# Patient Record
Sex: Female | Born: 1935
Health system: Southern US, Community
[De-identification: ages and names within clinical notes are randomized; demographics above are authoritative.]

## PROBLEM LIST (undated history)

## (undated) DIAGNOSIS — R413 Other amnesia: Secondary | ICD-10-CM

## (undated) DIAGNOSIS — F32A Depression, unspecified: Secondary | ICD-10-CM

## (undated) DIAGNOSIS — C439 Malignant melanoma of skin, unspecified: Secondary | ICD-10-CM

## (undated) DIAGNOSIS — I10 Essential (primary) hypertension: Secondary | ICD-10-CM

## (undated) DIAGNOSIS — R001 Bradycardia, unspecified: Secondary | ICD-10-CM

## (undated) DIAGNOSIS — E782 Mixed hyperlipidemia: Secondary | ICD-10-CM

## (undated) DIAGNOSIS — N200 Calculus of kidney: Secondary | ICD-10-CM

## (undated) DIAGNOSIS — I5022 Chronic systolic (congestive) heart failure: Secondary | ICD-10-CM

## (undated) DIAGNOSIS — I493 Ventricular premature depolarization: Secondary | ICD-10-CM

## (undated) DIAGNOSIS — IMO0002 Reserved for concepts with insufficient information to code with codable children: Secondary | ICD-10-CM

## (undated) DIAGNOSIS — K862 Cyst of pancreas: Secondary | ICD-10-CM

## (undated) DIAGNOSIS — F419 Anxiety disorder, unspecified: Secondary | ICD-10-CM

## (undated) DIAGNOSIS — F329 Major depressive disorder, single episode, unspecified: Secondary | ICD-10-CM

## (undated) DIAGNOSIS — H409 Unspecified glaucoma: Secondary | ICD-10-CM

## (undated) DIAGNOSIS — K5792 Diverticulitis of intestine, part unspecified, without perforation or abscess without bleeding: Secondary | ICD-10-CM

## (undated) DIAGNOSIS — I2510 Atherosclerotic heart disease of native coronary artery without angina pectoris: Secondary | ICD-10-CM

## (undated) DIAGNOSIS — Z950 Presence of cardiac pacemaker: Secondary | ICD-10-CM

## (undated) HISTORY — PX: PACEMAKER INSERTION: SHX728

## (undated) HISTORY — DX: Diverticulitis of intestine, part unspecified, without perforation or abscess without bleeding: K57.92

## (undated) HISTORY — DX: Mixed hyperlipidemia: E78.2

## (undated) HISTORY — PX: CATARACT EXTRACTION, BILATERAL: SHX1313

## (undated) HISTORY — DX: Unspecified glaucoma: H40.9

## (undated) HISTORY — DX: Depression, unspecified: F32.A

## (undated) HISTORY — DX: Major depressive disorder, single episode, unspecified: F32.9

## (undated) HISTORY — PX: EYE SURGERY: SHX253

## (undated) HISTORY — PX: TUMOR EXCISION: SHX421

## (undated) HISTORY — DX: Ventricular premature depolarization: I49.3

---
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
## 1981-10-05 HISTORY — PX: CHOLECYSTECTOMY: SHX55

## 2006-04-17 ENCOUNTER — Emergency Department: Payer: Self-pay | Admitting: Internal Medicine

## 2006-04-17 ENCOUNTER — Other Ambulatory Visit: Payer: Self-pay

## 2006-04-26 ENCOUNTER — Inpatient Hospital Stay: Payer: Self-pay | Admitting: Unknown Physician Specialty

## 2006-04-26 ENCOUNTER — Other Ambulatory Visit: Payer: Self-pay

## 2006-06-08 ENCOUNTER — Ambulatory Visit: Payer: Self-pay | Admitting: Unknown Physician Specialty

## 2006-07-05 ENCOUNTER — Ambulatory Visit: Payer: Self-pay | Admitting: Unknown Physician Specialty

## 2006-08-05 ENCOUNTER — Ambulatory Visit: Payer: Self-pay | Admitting: Unknown Physician Specialty

## 2006-10-05 DIAGNOSIS — C439 Malignant melanoma of skin, unspecified: Secondary | ICD-10-CM

## 2006-10-05 HISTORY — DX: Malignant melanoma of skin, unspecified: C43.9

## 2006-11-05 ENCOUNTER — Ambulatory Visit: Payer: Self-pay | Admitting: Unknown Physician Specialty

## 2006-12-04 ENCOUNTER — Ambulatory Visit: Payer: Self-pay | Admitting: Unknown Physician Specialty

## 2007-01-04 ENCOUNTER — Ambulatory Visit: Payer: Self-pay | Admitting: Unknown Physician Specialty

## 2007-02-03 ENCOUNTER — Ambulatory Visit: Payer: Self-pay | Admitting: Unknown Physician Specialty

## 2007-03-06 ENCOUNTER — Ambulatory Visit: Payer: Self-pay | Admitting: Unknown Physician Specialty

## 2007-04-05 ENCOUNTER — Ambulatory Visit: Payer: Self-pay | Admitting: Unknown Physician Specialty

## 2007-05-06 ENCOUNTER — Ambulatory Visit: Payer: Self-pay | Admitting: Unknown Physician Specialty

## 2007-06-06 ENCOUNTER — Ambulatory Visit: Payer: Self-pay | Admitting: Unknown Physician Specialty

## 2007-06-15 ENCOUNTER — Ambulatory Visit: Payer: Self-pay | Admitting: Cardiology

## 2007-06-20 ENCOUNTER — Ambulatory Visit: Payer: Self-pay | Admitting: Internal Medicine

## 2007-06-21 ENCOUNTER — Ambulatory Visit: Payer: Self-pay

## 2007-06-22 ENCOUNTER — Ambulatory Visit: Payer: Self-pay | Admitting: Cardiology

## 2007-07-06 ENCOUNTER — Ambulatory Visit: Payer: Self-pay | Admitting: Unknown Physician Specialty

## 2007-07-26 ENCOUNTER — Ambulatory Visit: Payer: Self-pay | Admitting: Internal Medicine

## 2007-08-02 ENCOUNTER — Ambulatory Visit: Payer: Self-pay

## 2007-08-11 ENCOUNTER — Ambulatory Visit: Payer: Self-pay

## 2007-08-12 ENCOUNTER — Ambulatory Visit: Payer: Self-pay | Admitting: Unknown Physician Specialty

## 2007-08-18 ENCOUNTER — Ambulatory Visit: Payer: Self-pay

## 2007-09-05 ENCOUNTER — Ambulatory Visit: Payer: Self-pay | Admitting: Unknown Physician Specialty

## 2007-10-06 ENCOUNTER — Ambulatory Visit: Payer: Self-pay | Admitting: Unknown Physician Specialty

## 2007-11-06 ENCOUNTER — Ambulatory Visit: Payer: Self-pay | Admitting: Unknown Physician Specialty

## 2007-12-04 ENCOUNTER — Ambulatory Visit: Payer: Self-pay | Admitting: Unknown Physician Specialty

## 2008-01-04 ENCOUNTER — Ambulatory Visit: Payer: Self-pay | Admitting: Unknown Physician Specialty

## 2008-01-17 ENCOUNTER — Ambulatory Visit: Payer: Self-pay | Admitting: Internal Medicine

## 2008-01-18 ENCOUNTER — Ambulatory Visit: Payer: Self-pay | Admitting: Cardiology

## 2008-01-18 ENCOUNTER — Ambulatory Visit: Payer: Self-pay | Admitting: Internal Medicine

## 2008-02-03 ENCOUNTER — Ambulatory Visit: Payer: Self-pay | Admitting: Unknown Physician Specialty

## 2008-03-05 ENCOUNTER — Ambulatory Visit: Payer: Self-pay | Admitting: Unknown Physician Specialty

## 2008-04-04 ENCOUNTER — Ambulatory Visit: Payer: Self-pay | Admitting: Unknown Physician Specialty

## 2008-05-07 ENCOUNTER — Ambulatory Visit: Payer: Self-pay | Admitting: Unknown Physician Specialty

## 2008-06-05 ENCOUNTER — Ambulatory Visit: Payer: Self-pay | Admitting: Unknown Physician Specialty

## 2008-06-18 ENCOUNTER — Ambulatory Visit: Payer: Self-pay | Admitting: Cardiology

## 2008-07-05 ENCOUNTER — Ambulatory Visit: Payer: Self-pay | Admitting: Unknown Physician Specialty

## 2008-07-16 ENCOUNTER — Ambulatory Visit: Payer: Self-pay | Admitting: Internal Medicine

## 2008-09-04 ENCOUNTER — Ambulatory Visit: Payer: Self-pay | Admitting: Unknown Physician Specialty

## 2008-10-05 ENCOUNTER — Ambulatory Visit: Payer: Self-pay | Admitting: Unknown Physician Specialty

## 2008-10-24 ENCOUNTER — Encounter (INDEPENDENT_AMBULATORY_CARE_PROVIDER_SITE_OTHER): Payer: Self-pay | Admitting: *Deleted

## 2008-11-05 ENCOUNTER — Ambulatory Visit: Payer: Self-pay | Admitting: Unknown Physician Specialty

## 2008-12-03 ENCOUNTER — Ambulatory Visit: Payer: Self-pay | Admitting: Unknown Physician Specialty

## 2009-01-03 ENCOUNTER — Ambulatory Visit: Payer: Self-pay | Admitting: Unknown Physician Specialty

## 2009-02-15 ENCOUNTER — Encounter (INDEPENDENT_AMBULATORY_CARE_PROVIDER_SITE_OTHER): Payer: Self-pay | Admitting: *Deleted

## 2009-02-22 DIAGNOSIS — F329 Major depressive disorder, single episode, unspecified: Secondary | ICD-10-CM | POA: Insufficient documentation

## 2009-02-22 DIAGNOSIS — E785 Hyperlipidemia, unspecified: Secondary | ICD-10-CM

## 2009-02-22 DIAGNOSIS — I498 Other specified cardiac arrhythmias: Secondary | ICD-10-CM

## 2009-02-22 DIAGNOSIS — I493 Ventricular premature depolarization: Secondary | ICD-10-CM

## 2009-02-22 DIAGNOSIS — I10 Essential (primary) hypertension: Secondary | ICD-10-CM

## 2009-02-27 ENCOUNTER — Encounter: Payer: Self-pay | Admitting: Internal Medicine

## 2009-02-27 ENCOUNTER — Ambulatory Visit: Payer: Self-pay | Admitting: Internal Medicine

## 2009-03-05 ENCOUNTER — Ambulatory Visit: Payer: Self-pay | Admitting: Unknown Physician Specialty

## 2009-03-20 ENCOUNTER — Telehealth: Payer: Self-pay | Admitting: Internal Medicine

## 2009-03-22 ENCOUNTER — Ambulatory Visit: Payer: Self-pay | Admitting: Internal Medicine

## 2009-03-22 ENCOUNTER — Encounter: Payer: Self-pay | Admitting: Internal Medicine

## 2009-03-22 DIAGNOSIS — R0789 Other chest pain: Secondary | ICD-10-CM

## 2009-04-02 ENCOUNTER — Ambulatory Visit: Payer: Self-pay | Admitting: Cardiology

## 2009-04-02 ENCOUNTER — Encounter: Payer: Self-pay | Admitting: Cardiology

## 2009-04-16 ENCOUNTER — Ambulatory Visit: Payer: Self-pay | Admitting: Cardiology

## 2009-04-16 ENCOUNTER — Encounter: Payer: Self-pay | Admitting: Cardiovascular Disease

## 2009-04-16 ENCOUNTER — Ambulatory Visit: Payer: Self-pay | Admitting: Internal Medicine

## 2009-04-16 ENCOUNTER — Encounter: Payer: Self-pay | Admitting: Internal Medicine

## 2009-04-22 ENCOUNTER — Telehealth: Payer: Self-pay | Admitting: Cardiology

## 2009-04-26 ENCOUNTER — Ambulatory Visit: Payer: Self-pay | Admitting: Unknown Physician Specialty

## 2009-05-05 ENCOUNTER — Ambulatory Visit: Payer: Self-pay | Admitting: Unknown Physician Specialty

## 2009-06-05 ENCOUNTER — Ambulatory Visit: Payer: Self-pay | Admitting: Unknown Physician Specialty

## 2009-07-05 ENCOUNTER — Ambulatory Visit: Payer: Self-pay | Admitting: Unknown Physician Specialty

## 2009-08-05 ENCOUNTER — Ambulatory Visit: Payer: Self-pay | Admitting: Unknown Physician Specialty

## 2009-09-04 ENCOUNTER — Ambulatory Visit: Payer: Self-pay | Admitting: Unknown Physician Specialty

## 2009-10-07 ENCOUNTER — Ambulatory Visit: Payer: Self-pay | Admitting: Unknown Physician Specialty

## 2009-11-04 ENCOUNTER — Ambulatory Visit: Payer: Self-pay | Admitting: Internal Medicine

## 2009-11-05 ENCOUNTER — Ambulatory Visit: Payer: Self-pay | Admitting: Unknown Physician Specialty

## 2009-12-16 DIAGNOSIS — N23 Unspecified renal colic: Secondary | ICD-10-CM | POA: Insufficient documentation

## 2009-12-16 DIAGNOSIS — N200 Calculus of kidney: Secondary | ICD-10-CM | POA: Insufficient documentation

## 2009-12-20 ENCOUNTER — Ambulatory Visit: Payer: Self-pay | Admitting: Unknown Physician Specialty

## 2009-12-26 ENCOUNTER — Ambulatory Visit: Payer: Self-pay | Admitting: Urology

## 2010-01-06 ENCOUNTER — Ambulatory Visit: Payer: Self-pay | Admitting: General Practice

## 2010-01-08 ENCOUNTER — Ambulatory Visit: Payer: Self-pay | Admitting: Urology

## 2010-01-09 ENCOUNTER — Ambulatory Visit: Payer: Self-pay | Admitting: Urology

## 2010-01-13 ENCOUNTER — Ambulatory Visit: Payer: Self-pay | Admitting: Internal Medicine

## 2010-01-15 ENCOUNTER — Ambulatory Visit: Payer: Self-pay | Admitting: Unknown Physician Specialty

## 2010-01-21 ENCOUNTER — Ambulatory Visit: Payer: Self-pay | Admitting: General Practice

## 2010-01-27 ENCOUNTER — Ambulatory Visit: Payer: Self-pay | Admitting: Internal Medicine

## 2010-01-27 ENCOUNTER — Encounter: Payer: Self-pay | Admitting: Cardiovascular Disease

## 2010-02-03 ENCOUNTER — Telehealth: Payer: Self-pay | Admitting: Internal Medicine

## 2010-02-03 ENCOUNTER — Ambulatory Visit: Payer: Self-pay | Admitting: General Practice

## 2010-02-04 DIAGNOSIS — Z950 Presence of cardiac pacemaker: Secondary | ICD-10-CM

## 2010-02-11 ENCOUNTER — Telehealth: Payer: Self-pay | Admitting: Internal Medicine

## 2010-02-18 ENCOUNTER — Ambulatory Visit: Payer: Self-pay | Admitting: Internal Medicine

## 2010-02-18 ENCOUNTER — Encounter: Payer: Self-pay | Admitting: Internal Medicine

## 2010-02-24 ENCOUNTER — Encounter: Payer: Self-pay | Admitting: Internal Medicine

## 2010-02-24 ENCOUNTER — Ambulatory Visit: Payer: Self-pay | Admitting: Internal Medicine

## 2010-02-26 ENCOUNTER — Telehealth: Payer: Self-pay | Admitting: Internal Medicine

## 2010-02-27 ENCOUNTER — Ambulatory Visit: Payer: Self-pay | Admitting: Internal Medicine

## 2010-02-28 ENCOUNTER — Ambulatory Visit: Payer: Self-pay | Admitting: Unknown Physician Specialty

## 2010-03-05 ENCOUNTER — Ambulatory Visit: Payer: Self-pay | Admitting: Unknown Physician Specialty

## 2010-03-11 ENCOUNTER — Ambulatory Visit: Payer: Self-pay | Admitting: Internal Medicine

## 2010-04-04 ENCOUNTER — Ambulatory Visit: Payer: Self-pay | Admitting: Unknown Physician Specialty

## 2010-04-09 ENCOUNTER — Encounter: Payer: Self-pay | Admitting: Internal Medicine

## 2010-04-14 ENCOUNTER — Ambulatory Visit: Payer: Self-pay | Admitting: Internal Medicine

## 2010-05-07 ENCOUNTER — Ambulatory Visit: Payer: Self-pay | Admitting: General Practice

## 2010-05-14 ENCOUNTER — Ambulatory Visit: Payer: Self-pay | Admitting: Urology

## 2010-05-26 ENCOUNTER — Ambulatory Visit: Payer: Self-pay | Admitting: General Practice

## 2010-05-28 ENCOUNTER — Ambulatory Visit: Payer: Self-pay | Admitting: Unknown Physician Specialty

## 2010-06-05 ENCOUNTER — Ambulatory Visit: Payer: Self-pay | Admitting: Unknown Physician Specialty

## 2010-06-13 ENCOUNTER — Ambulatory Visit: Payer: Self-pay | Admitting: Cardiovascular Disease

## 2010-06-17 ENCOUNTER — Telehealth: Payer: Self-pay | Admitting: Cardiovascular Disease

## 2010-07-07 ENCOUNTER — Ambulatory Visit: Payer: Self-pay | Admitting: Unknown Physician Specialty

## 2010-07-17 ENCOUNTER — Ambulatory Visit: Payer: Self-pay | Admitting: Internal Medicine

## 2010-07-21 ENCOUNTER — Telehealth: Payer: Self-pay | Admitting: Internal Medicine

## 2010-08-01 ENCOUNTER — Encounter (INDEPENDENT_AMBULATORY_CARE_PROVIDER_SITE_OTHER): Payer: Self-pay | Admitting: *Deleted

## 2010-08-18 ENCOUNTER — Ambulatory Visit: Payer: Self-pay | Admitting: Urology

## 2010-08-22 ENCOUNTER — Ambulatory Visit: Payer: Self-pay | Admitting: Unknown Physician Specialty

## 2010-09-04 ENCOUNTER — Ambulatory Visit: Payer: Self-pay | Admitting: Unknown Physician Specialty

## 2010-10-06 ENCOUNTER — Ambulatory Visit: Payer: No Typology Code available for payment source | Admitting: Unknown Physician Specialty

## 2010-10-20 ENCOUNTER — Encounter: Payer: Self-pay | Admitting: Internal Medicine

## 2010-11-03 ENCOUNTER — Encounter: Payer: Self-pay | Admitting: Internal Medicine

## 2010-11-03 ENCOUNTER — Ambulatory Visit
Admission: RE | Admit: 2010-11-03 | Discharge: 2010-11-03 | Payer: Self-pay | Source: Home / Self Care | Attending: Cardiovascular Disease | Admitting: Cardiovascular Disease

## 2010-11-03 DIAGNOSIS — I2510 Atherosclerotic heart disease of native coronary artery without angina pectoris: Secondary | ICD-10-CM | POA: Insufficient documentation

## 2010-11-04 NOTE — Progress Notes (Signed)
Summary: PACEMAKER  Phone Note Call from Patient Call back at Home Phone [phone]   Caller: SELF Call For: KLEIN Summary of Call: HAS A PAINFUL LUMP ON THE LEFT SIDE OF THE INCISION WHERE THE PACEMAKER WAS IMPLANTED-IS THIS NORMAL?   Initial call taken by: Harlon Flor,  Feb 26, 2010 9:17 AM  Follow-up for Phone Call        Attempted TCB pt.  LMOM TCB. Cloyde Reams RN  Feb 26, 2010 5:35 PM   Spoke with pt, Gunnar Fusi and Dr Graciela Husbands are in office this am.  Gunnar Fusi will check pacer site.  Pt will come on in.   Follow-up by: Cloyde Reams RN,  Feb 27, 2010 10:33 AM

## 2010-11-04 NOTE — Procedures (Signed)
Summary: PC2/AMD   Current Medications (verified): 1)  Benazepril-Hydrochlorothiazide 20-25 Mg Tabs (Benazepril-Hydrochlorothiazide) .[name].. 1 By Mouth Once Daily 2)  Potassium Chloride Crys Cr 20 Meq Cr-Tabs (Potassium Chloride Crys Cr) .... Take One Tablet By Mouth Daily 3)  Multivitamins   Tabs (Multiple Vitamin) .[name].. 1 By Mouth Once Daily 4)  Vitamin B Complex-C   Caps (B Complex-C) .[name].. 1 By Mouth Once Daily 5)  Cholesterol Management  Tabs (Nutritional Supplements) .[name].. 1 By Mouth Once Daily 6)  Co Q-10 150 Mg Caps (Coenzyme Q10) .[name].. 1 By Mouth Once Daily 7)  Glucosamine-Chondroitin  Caps (Glucosamine-Chondroit-Vit C-Mn) .... Take Once Daily 8)  Krill Oil 1000 Mg Caps (Krill Oil) .... Take 1 By Mouth Once Daily 9)  Lecithin 1200 Mg Caps (Lecithin) .[name].. 1 By Mouth Once Daily 10)  Silymarin  Caps (Milk Thistle-Turmeric) .[name].. 1 By Mouth Once Daily 11)  Vitamin D 1000 Unit Tabs (Cholecalciferol) .... 2 By Mouth Daily 12)  Fish Oil 1000 Mg Caps (Omega-3 Fatty Acids) .[name].. 1 By Mouth Once Daily 13)  Zyprexa 5 Mg Tabs (Olanzapine) .... One By Mouth At Night  Allergies (verified): 1)  ! Codeine 2)  ! * Simvastatin   PPM Specifications Following MD:  Sherryl Manges, MD     PPM Vendor:  Medtronic     PPM Model Number:  [id number]     PPM Serial Number:  [id number] PPM DOI:  08/13/2005      Lead 1    Location: RA     DOI: 08/13/2005     Model #: 4076     Serial #: [id number]     Status: active Lead 2    Location: RV     DOI: 08/13/2005     Model #: 7829     Serial #: [id number]     Status: active  Magnet Response Rate:  BOL 85 ERI 65    PPM Follow Up Remote Check?  No Battery Voltage:  2.96 V     Pacer Dependent:  No       PPM Device Measurements Atrium  Amplitude: 2.7 mV, Impedance: 316 ohms, Threshold: 1.0 V at 0.5 msec Right Ventricle  Amplitude: 2.0 mV, Impedance: 368 ohms, Threshold: 1.0 V at 0.4 msec  Episodes MS Episodes:  8     Percent Mode Switch:  3 minutes     Coumadin:   No Ventricular High Rate:  3     Atrial Pacing:  52.1%     Ventricular Pacing:  5.9%  Parameters Mode:  DDDR+     Lower Rate Limit:  60     Upper Rate Limit:  130 Paced AV Delay:  240     Sensed AV Delay:  180 Next Cardiology Appt Due:  04/04/2010 Tech Comments:  Atrial sensitivity reprogrammed 0.9, some FFRW noted. Sinus rhythm today with frequent PVC's.  ROV 6 months [name], [place]. Altha Harm, LPN  November 04, 2009 10:18 AM

## 2010-11-04 NOTE — Miscellaneous (Signed)
Summary: dx code correction  Clinical Lists Changes  Problems: Changed problem from PACEMAKER (ICD-V45.[name]01) to PACEMAKER, PERMANENT (ICD-V45.01)  changed the incorrect dx code to correct dx code Genella Mech  August 01, 2010 10:29 AM

## 2010-11-04 NOTE — Miscellaneous (Signed)
Summary: Device change out  Clinical Lists Changes  Observations: Added new observation of PPM DOI: 02/24/2010 (04/09/2010 7:23) Added new observation of PPM SERL#: [id number] (04/09/2010 7:23) Added new observation of PPM MODL#: [id number] (04/09/2010 7:23) Added new observation of PPMEXPLCOMM: 02/24/2010 Medtronic Enrhythm P1501DR/[id number] h explanted. (04/09/2010 7:23)      PPM Specifications Following MD:  Sherryl Manges, MD     PPM Vendor:  Medtronic     PPM Model Number:  ADDR01     PPM Serial Number:  [id number] PPM DOI:  02/24/2010      Lead 1    Location: RA     DOI: 08/13/2005     Model #: 4076     Serial #: [id number]     Status: active Lead 2    Location: RV     DOI: 08/13/2005     Model #: 4076     Serial #: [id number]     Status: active  Magnet Response Rate:  BOL 85 ERI 65  Indications:  Bradycardia  Explantation Comments:  02/24/2010 Medtronic Enrhythm P1501DR/[id number] h explanted.  PPM Follow Up Pacer Dependent:  No      Episodes Coumadin:  No  Parameters Mode:  DDD+     Lower Rate Limit:  60     Upper Rate Limit:  130 Paced AV Delay:  180     Sensed AV Delay:  150

## 2010-11-04 NOTE — Procedures (Signed)
Summary: Cardiology Device Clinic   Current Medications (verified): 1)  Benazepril-Hydrochlorothiazide 20-25 Mg Tabs (Benazepril-Hydrochlorothiazide) .[name].. 1 By Mouth Once Daily 2)  Potassium Chloride Crys Cr 20 Meq Cr-Tabs (Potassium Chloride Crys Cr) .... Take One Tablet By Mouth Daily 3)  Multivitamins   Tabs (Multiple Vitamin) .[name].. 1 By Mouth Once Daily 4)  Vitamin B Complex-C   Caps (B Complex-C) .[name].. 1 By Mouth Once Daily 5)  Cholesterol Management  Tabs (Nutritional Supplements) .[name].. 1 By Mouth Once Daily 6)  Co Q-10 150 Mg Caps (Coenzyme Q10) .[name].. 1 By Mouth Once Daily 7)  Glucosamine-Chondroitin  Caps (Glucosamine-Chondroit-Vit C-Mn) .... Take Once Daily 8)  Vitamin D 1000 Unit Tabs (Cholecalciferol) .... 2 By Mouth Daily 9)  Fish Oil 1000 Mg Caps (Omega-3 Fatty Acids) .[name].. 1 By Mouth Once Daily 10)  Prozac 20 Mg Caps (Fluoxetine Hcl) .... One By Mouth Daily  Allergies (verified): 1)  ! Codeine 2)  ! * Simvastatin  PPM Specifications Following MD:  Sherryl Manges, MD     PPM Vendor:  Medtronic     PPM Model Number:  [id number]     PPM Serial Number:  [id number] PPM DOI:  08/13/2005      Lead 1    Location: RA     DOI: 08/13/2005     Model #: 4076     Serial #: [id number]     Status: active Lead 2    Location: RV     DOI: 08/13/2005     Model #: 7829     Serial #: [id number]     Status: active  Magnet Response Rate:  BOL 85 ERI 65  Indications:  Bradycardia   PPM Follow Up Remote Check?  No Battery Voltage:  2.81 V     Battery Est. Longevity:  ERI     Pacer Dependent:  No       PPM Device Measurements Atrium  Amplitude: 3.0 mV, Impedance: 324 ohms, Threshold: 1.0 V at 0.5 msec Right Ventricle  Amplitude: 2.7 mV, Impedance: 360 ohms, Threshold: 1.0 V at 0.4 msec  Episodes MS Episodes:  0     Percent Mode Switch:  0     Coumadin:  No Ventricular High Rate:  0      Parameters Mode:  DDDR+     Lower Rate Limit:  60     Upper Rate Limit:  130 Paced AV Delay:  240     Sensed AV  Delay:  180 Tech Comments:  Battery @ ERI 11/08/09.  Device function normal.  R-waves 2.31mV chronic.  ROV 2 weeks with [name] in [place]. Altha Harm, LPN  January 13, 2010 9:34 AM

## 2010-11-04 NOTE — Progress Notes (Signed)
Summary: FEET  Phone Note Call from Patient Call back at Home Phone [phone]   Caller: SELF Call For: [facility] Summary of Call: WAS TOLD THAT THERE WAS NOT A CIRCULATION PROBLEM WITH HER FEET-WOULD LIKE TO KNOW IF IT IS RELATED TO HER NERVES AND IF SO IS THERE A PHYSICIAN THAT COULD BE RECOMMENDED TO HER REGARDING THAT? Initial call taken by: Harlon Flor,  June 17, 2010 10:46 AM  Follow-up for Phone Call        referred pt to her PCP. Benedict Needy, RN  June 17, 2010 5:06 PM

## 2010-11-04 NOTE — Procedures (Signed)
Summary: ROV/GLC   Current Medications (verified): 1)  Benazepril-Hydrochlorothiazide 20-25 Mg Tabs (Benazepril-Hydrochlorothiazide) .[name].. 1 By Mouth Once Daily 2)  Potassium Chloride Crys Cr 20 Meq Cr-Tabs (Potassium Chloride Crys Cr) .... Take One Tablet By Mouth Daily 3)  Multivitamins   Tabs (Multiple Vitamin) .[name].. 1 By Mouth Once Daily 4)  Vitamin B Complex-C   Caps (B Complex-C) .[name].. 1 By Mouth Once Daily 5)  Cholesterol Management  Tabs (Nutritional Supplements) .[name].. 1 By Mouth Once Daily 6)  Co Q-10 150 Mg Caps (Coenzyme Q10) .[name].. 1 By Mouth Once Daily 7)  Glucosamine-Chondroitin  Caps (Glucosamine-Chondroit-Vit C-Mn) .... Take Once Daily 8)  Vitamin D 1000 Unit Tabs (Cholecalciferol) .... 2 By Mouth Daily 9)  Fish Oil 1000 Mg Caps (Omega-3 Fatty Acids) .[name].. 1 By Mouth Once Daily 10)  Prozac 20 Mg Caps (Fluoxetine Hcl) .... One By Mouth Daily 11)  Cod Liver Oil  Caps (Cod Liver Oil) .... 3 By Mouth Daily  Allergies (verified): 1)  ! Codeine 2)  ! * Simvastatin   PPM Specifications Following MD:  Sherryl Manges, MD     PPM Vendor:  Medtronic     PPM Model Number:  [id number]     PPM Serial Number:  [id number] PPM DOI:  08/13/2005      Lead 1    Location: RA     DOI: 08/13/2005     Model #: 4076     Serial #: [id number]     Status: active Lead 2    Location: RV     DOI: 08/13/2005     Model #: 8657     Serial #: [id number]     Status: active  Magnet Response Rate:  BOL 85 ERI 65  Indications:  Bradycardia   PPM Follow Up Remote Check?  No Battery Voltage:  2.79 V     Battery Est. Longevity:  10.5 years     Pacer Dependent:  No       PPM Device Measurements Atrium  Amplitude: 2.8 mV, Impedance: 339 ohms, Threshold: 0.875 V at 0.4 msec Right Ventricle  Amplitude: 2.8 mV, Impedance: 422 ohms, Threshold: 0.75 V at 0.4 msec  Episodes MS Episodes:  0     Percent Mode Switch:  0     Coumadin:  No Ventricular High Rate:  0     Atrial Pacing:  47.9%     Ventricular Pacing:   37.7%  Parameters Mode:  DDD+     Lower Rate Limit:  60     Upper Rate Limit:  130 Paced AV Delay:  180     Sensed AV Delay:  150 Next Cardiology Appt Due:  06/05/2010 Tech Comments:  Steri strips removed by the patient.  No redness or edema noted. R-waves are chronic @ 2.8-4.27mV.  There were a total of 399,383 single PVC's from 5/23-6/7.  ROV with [name] in the [place] office in 3 months. Altha Harm, LPN  March 11, 8412 8:56 AM

## 2010-11-04 NOTE — Assessment & Plan Note (Signed)
Summary: F/U 3 MONTHS   Visit Type:  Follow-up Primary Barbara Marks:  Barbara Marks  CC:  c/o feeling fatigue and pacemaker doesn't feel right..  History of Present Illness: Barbara Marks returns today for pacemaker followup with recent genrerator replacement, following premature  ER I. She has had some complaints about the lay of the new pulse generator. There is some hyper sensitivity over the pulse generator. It is difficult to sleep on the left side.  S  She has a history of PVCs, bradycardia associated with her PVCs.   Her major complaint remains fatigue. She thinks is related to depression. She also continues to have chest pain. she underwent thallium scanning which was negative last June which was negative. her pain continues to come for hours at a time. It is unassociated with radiation package taste or exertion   She also has a history of profound f depression and anxiety requiring ECT treatment dating back to the 35s, hi        Current Medications (verified): 1)  Potassium Chloride Crys Cr 20 Meq Cr-Tabs (Potassium Chloride Crys Cr) .... Take One Tablet By Mouth Daily 2)  Multivitamins   Tabs (Multiple Vitamin) .[name].. 1 By Mouth Once Daily 3)  Vitamin B Complex-C   Caps (B Complex-C) .[name].. 1 By Mouth Once Daily 4)  Cholesterol Management  Tabs (Nutritional Supplements) .[name].. 1 By Mouth Once Daily 5)  Co Q-10 150 Mg Caps (Coenzyme Q10) .[name].. 1 By Mouth Once Daily 6)  Glucosamine-Chondroitin  Caps (Glucosamine-Chondroit-Vit C-Mn) .... Take Once Daily 7)  Vitamin D 1000 Unit Tabs (Cholecalciferol) .... 2 By Mouth Daily 8)  Cod Liver Oil  Caps (Cod Liver Oil) .... 3 By Mouth Daily 9)  Benazepril Hcl 20 Mg Tabs (Benazepril Hcl) .... One Tablet Once Daily 10)  Celexa 10 Mg Tabs (Citalopram Hydrobromide) .... One Tablet Once Daily  Allergies (verified): 1)  ! Codeine 2)  ! * Simvastatin  Past History:  Past Medical History: Last updated: 02/22/2009 DEPRESSION (ICD-311) PREMATURE  VENTRICULAR CONTRACTIONS (ICD-427.69) HYPERLIPIDEMIA-MIXED (ICD-272.4) BRADYCARDIA (ICD-427.89) HYPERTENSION, UNSPECIFIED (ICD-401.9) PACEMAKER (ICD-V45.[name]01)    Past Surgical History: Last updated: 02/22/2009 Appendectomy - '52 tumors & nemamgeomas  gall bladder - '83 pacemaker - medtronic  Social History: Last updated: 02/22/2009 Retired  Married  Tobacco Use - No.  Alcohol Use - yes Drug Use - no  Risk Factors: Smoking Status: never (02/22/2009)  Vital Signs:  Patient profile:   77 year old female Height:      66 inches Weight:      152 pounds BP sitting:   144 / 70  (left arm) Cuff size:   regular  Vitals Entered By: Bishop Dublin, CMA (July 17, 2010 9:32 AM)  Physical Exam  General:  Well developed, well nourished, elderly woman in no apparent distress Chest Wall:  pacemaker pocket is well-healed without overlying erythema Lungs:  Clear bilaterally to auscultation and percussion. Heart:  Non-displaced PMI, chest non-tender; regular rate and rhythm, S1, S2 without murmurs, rubs or gallops. Carotid upstroke normal, no bruit.  Pedals normal pulses. No edema, no varicosities. Abdomen:   abdomen soft and non-tender active bowels Msk:  Back normal, normal gait. Muscle strength and tone normal. Extremities:  no clubbing cyanosis or edema Neurologic:  grossly normal motor and sensory function Skin:  and dry without rashes Psych:  flat affect   PPM Specifications Following MD:  Sherryl Manges, MD     PPM Vendor:  Medtronic     PPM Model Number:  ADDR01     PPM Serial Number:  [id number] PPM DOI:  02/24/2010      Lead 1    Location: RA     DOI: 08/13/2005     Model #: 4076     Serial #: [id number]     Status: active Lead 2    Location: RV     DOI: 08/13/2005     Model #: 4076     Serial #: [id number]     Status: active  Magnet Response Rate:  BOL 85 ERI 65  Indications:  Bradycardia  Explantation Comments:  02/24/2010 Medtronic Enrhythm P1501DR/[id number] h  explanted.  PPM Follow Up Remote Check?  No Battery Voltage:  2.80 V     Battery Est. Longevity:  11 years     Pacer Dependent:  No       PPM Device Measurements Atrium  Amplitude: 2.8 mV, Impedance: 350 ohms, Threshold: 0.875 V at 0.4 msec Right Ventricle  Amplitude: 2.8 mV, Impedance: 494 ohms, Threshold: 0.625 V at 0.4 msec  Episodes MS Episodes:  4     Percent Mode Switch:  <0.1%     Coumadin:  No Ventricular High Rate:  0     Atrial Pacing:  41.9%     Ventricular Pacing:  19.8%  Parameters Mode:  DDI     Lower Rate Limit:  40     Upper Rate Limit:  130 Paced AV Delay:  300     Sensed AV Delay:  150 Rate Response Parameters:  PVARP Next Cardiology Appt Due:  02/03/2011 Tech Comments:  Checked by [employer].Device reprogrammed as above. ROV 5/12 with [name] in [facility] [place], LPN  July 17, 2010 10:03 AM   Impression & Recommendations:  Problem # 1:  CARDIAC PACEMAKER IN SITU (ICD-V45.01) Device parameters and data were reviewed ; there is increased ventricular and atrial pacing. I have reprogrammed the device to the DDI mode at 40 beats per minute. This will help [place] understand how much he ever needs it and whether it is potentailly removable down the road  Problem # 2:  DEPRESSION (ICD-311) ongoing and i suspect the cause fo the depression.  I dont think it is likely that the fatigue is 2/2 pvc which have been there forever  Problem # 3:  PREMATURE VENTRICULAR CONTRACTIONS (ICD-427.69) as above Her updated medication list for this problem includes:    Benazepril Hcl 20 Mg Tabs (Benazepril hcl) ..... One tablet once daily  Problem # 4:  CHEST PAIN, NON-CARDIAC (ICD-786.59)  stable  Her updated medication list for this problem includes:    Benazepril Hcl 20 Mg Tabs (Benazepril hcl) ..... One tablet once daily

## 2010-11-04 NOTE — Op Note (Signed)
Summary: Explanation of previously implanted device & implantationof a ne  Explanation of previously implanted device & implantationof a new device   Imported By: Harlon Flor 02/25/2010 10:55:51  _____________________________________________________________________  External Attachment:    Type:   Image     Comment:   External Document

## 2010-11-04 NOTE — Cardiovascular Report (Signed)
Summary: Office Visit   Office Visit   Imported By: Roderic Ovens 01/22/2010 16:29:16  _____________________________________________________________________  External Attachment:    Type:   Image     Comment:   External Document

## 2010-11-04 NOTE — Cardiovascular Report (Signed)
Summary: Office Visit   Office Visit   Imported By: Roderic Ovens 11/06/2009 13:45:13  _____________________________________________________________________  External Attachment:    Type:   Image     Comment:   External Document

## 2010-11-04 NOTE — Assessment & Plan Note (Signed)
Summary: F2W/AMD   Primary Provider:  [name]  CC:  Chest pain;Device Check.  History of Present Illness:  .  [name] returns today as her pacemaker has reached inappropriately early ER I. She has the Medtronic and rhythm device which has has known characteristic. she comes in today to discuss generator replacement  She also continues to have chest pain. she underwent thallium scanning which was negative last June which was negative. her pain continues to come for hours at a time. It is unassociated with radiation package taste or exertion  She has been undergoing a great deal of stress recently. Her husband has been diagnosed with pancreatic cancer. She herself is resistant about ECT (again)  She also complains of cold intolerance.  Problems Prior to Update: 1)  Chest Tightness-pressure-other  ([id number]) 2)  Depression  (ICD-311) 3)  Premature Ventricular Contractions  (ICD-427.69) 4)  Hyperlipidemia-mixed  (ICD-272.4) 5)  Bradycardia  (ICD-427.89) 6)  Hypertension, Unspecified  (ICD-401.9) 7)  Pacemaker  (ICD-V45.[name]01)  Current Medications (verified): 1)  Benazepril-Hydrochlorothiazide 20-25 Mg Tabs (Benazepril-Hydrochlorothiazide) .[name].. 1 By Mouth Once Daily 2)  Potassium Chloride Crys Cr 20 Meq Cr-Tabs (Potassium Chloride Crys Cr) .... Take One Tablet By Mouth Daily 3)  Multivitamins   Tabs (Multiple Vitamin) .[name].. 1 By Mouth Once Daily 4)  Vitamin B Complex-C   Caps (B Complex-C) .[name].. 1 By Mouth Once Daily 5)  Cholesterol Management  Tabs (Nutritional Supplements) .[name].. 1 By Mouth Once Daily 6)  Co Q-10 150 Mg Caps (Coenzyme Q10) .[name].. 1 By Mouth Once Daily 7)  Glucosamine-Chondroitin  Caps (Glucosamine-Chondroit-Vit C-Mn) .... Take Once Daily 8)  Vitamin D 1000 Unit Tabs (Cholecalciferol) .... 2 By Mouth Daily 9)  Fish Oil 1000 Mg Caps (Omega-3 Fatty Acids) .[name].. 1 By Mouth Once Daily 10)  Prozac 20 Mg Caps (Fluoxetine Hcl) .... One By Mouth Daily  Allergies: 1)  !  Codeine 2)  ! * Simvastatin  Vital Signs:  Patient profile:   77 year old female Height:      66 inches Weight:      155 pounds BMI:     25.11 Pulse rate:   80 / minute BP sitting:   140 / 70  (left arm)  Vitals Entered By: Stanton Kidney, EMT-P (January 27, 2010 11:09 AM)  Physical Exam  General:  The patient was alert and oriented in no acute distress. HEENT Normal.  Neck veins were flat, carotids were brisk.  Lungs were clear.  Heart sounds were regular without murmurs or gallops.  Abdomen was soft with active bowel sounds. There is no clubbing cyanosis or edema. Skin Warm and dry    PPM Specifications Following MD:  Sherryl Manges, MD     PPM Vendor:  Medtronic     PPM Model Number:  [id number]     PPM Serial Number:  [id number] PPM DOI:  08/13/2005      Lead 1    Location: RA     DOI: 08/13/2005     Model #: 4076     Serial #: [id number]     Status: active Lead 2    Location: RV     DOI: 08/13/2005     Model #: 3295     Serial #: [id number]     Status: active  Magnet Response Rate:  BOL 85 ERI 65  Indications:  Bradycardia   PPM Follow Up Pacer Dependent:  No      Episodes Coumadin:  No  Parameters Mode:  DDDR+     Lower Rate Limit:  60     Upper Rate Limit:  130 Paced AV Delay:  240     Sensed AV Delay:  180  Impression & Recommendations:  Problem # 1:  CHEST TIGHTNESS-PRESSURE-OTHER ([id number])  she's had a negative Myoview in the last year. We'll check her troponin level today. I suspect that this is a manifestation of stress or PVCs .  Orders: T-TSH [phone]) T- * Misc. Laboratory test [id number])  Problem # 2:  PREMATURE VENTRICULAR CONTRACTIONS (ICD-427.69) She continues to have frequent PVCs;  I wonder whether this may be contributing to her chest pain and whether antiarrhythmic drugs for suppression of PVCs might not be worth a trial;  we will chech her TSH today Her updated medication list for this problem includes:    Benazepril-hydrochlorothiazide  20-25 Mg Tabs (Benazepril-hydrochlorothiazide) .[name]... 1 by mouth once daily  Problem # 3:  PACEMAKER (ICD-V45.[name]01) Device parameters and data were reviewed and no changes were made;  her device has reached ERI. She remains in the A-Dmode.  as she's not dependent and that she is at greatdeal of  ongoing stressors at home I asked her to call us when she like to get her device replaced. We have reviewed potential benefits and risks including but not limited to infection. She is agreeable to proceeding not withstanding the fact that she's not sure the device ever accomplished for her which she thought was her doctors thought to be treatment of her functional bradycardia related to her PVCs

## 2010-11-04 NOTE — Assessment & Plan Note (Signed)
Summary: ROV/AMD   Visit Type:  Follow-up Primary Provider:  [name]  CC:  c/o soreness to the touch at her pacemaker incision site.[name]  History of Present Illness:  .  Barbara Marks returns today in followup for pacer implanted for bradycardia which was related directly or artifactually to PVCs,  She is s/p recent pulse generator replacement.  There is some discomfort at the site wtihout eryhtema  Her husband has died in the interim  She also continues to have chest pain. she underwent thallium scanning which was negative last June        Current Medications (verified): 1)  Benazepril-Hydrochlorothiazide 20-25 Mg Tabs (Benazepril-Hydrochlorothiazide) .[name].. 1 By Mouth Once Daily 2)  Potassium Chloride Crys Cr 20 Meq Cr-Tabs (Potassium Chloride Crys Cr) .... Take One Tablet By Mouth Daily 3)  Multivitamins   Tabs (Multiple Vitamin) .[name].. 1 By Mouth Once Daily 4)  Vitamin B Complex-C   Caps (B Complex-C) .[name].. 1 By Mouth Once Daily 5)  Cholesterol Management  Tabs (Nutritional Supplements) .[name].. 1 By Mouth Once Daily 6)  Co Q-10 150 Mg Caps (Coenzyme Q10) .[name].. 1 By Mouth Once Daily 7)  Glucosamine-Chondroitin  Caps (Glucosamine-Chondroit-Vit C-Mn) .... Take Once Daily 8)  Vitamin D 1000 Unit Tabs (Cholecalciferol) .... 2 By Mouth Daily 9)  Fish Oil 1000 Mg Caps (Omega-3 Fatty Acids) .[name].. 1 By Mouth Once Daily 10)  Prozac 20 Mg Caps (Fluoxetine Hcl) .... One By Mouth Daily 11)  Cod Liver Oil  Caps (Cod Liver Oil) .... 3 By Mouth Daily  Allergies (verified): 1)  ! Codeine 2)  ! * Simvastatin  Past History:  Past Medical History: Last updated: 02/22/2009 DEPRESSION (ICD-311) PREMATURE VENTRICULAR CONTRACTIONS (ICD-427.69) HYPERLIPIDEMIA-MIXED (ICD-272.4) BRADYCARDIA (ICD-427.89) HYPERTENSION, UNSPECIFIED (ICD-401.9) PACEMAKER (ICD-V45.[name]01)    Past Surgical History: Last updated: 02/22/2009 Appendectomy - '52 tumors & nemamgeomas  gall bladder - '83 pacemaker -  medtronic  Social History: Last updated: 02/22/2009 Retired  Married  Tobacco Use - No.  Alcohol Use - yes Drug Use - no  Risk Factors: Smoking Status: never (02/22/2009)  Vital Signs:  Patient profile:   77 year old female Height:      66 inches Weight:      158 pounds BMI:     25.59 Pulse rate:   79 / minute BP sitting:   177 / 84  (left arm) Cuff size:   regular  Vitals Entered By: Bishop Dublin, CMA (April 14, 2010 1:51 PM)  Physical Exam  General:  The patient was alert and oriented in no acute distress. HEENT Normal.  Neck veins were flat, carotids were brisk.  Lungs were clear.  Heart sounds were regular without murmurs or gallops.  Abdomen was soft with active bowel sounds. There is no clubbing cyanosis or edema. Skin Warm and dry pacer site is well healed   PPM Specifications Following MD:  Sherryl Manges, MD     PPM Vendor:  Medtronic     PPM Model Number:  ADDR01     PPM Serial Number:  [id number] PPM DOI:  02/24/2010      Lead 1    Location: RA     DOI: 08/13/2005     Model #: 4076     Serial #: [id number]     Status: active Lead 2    Location: RV     DOI: 08/13/2005     Model #: 2130     Serial #: [id number]     Status: active  Magnet Response Rate:  BOL 85 ERI 65  Indications:  Bradycardia  Explantation Comments:  02/24/2010 Medtronic Enrhythm [id number] h explanted.  PPM Follow Up Pacer Dependent:  No      Episodes Coumadin:  No  Parameters Mode:  DDD+     Lower Rate Limit:  60     Upper Rate Limit:  130 Paced AV Delay:  180     Sensed AV Delay:  150  Impression & Recommendations:  Problem # 1:  CHEST TIGHTNESS-PRESSURE-OTHER ([id number]) atypical  nothing to do   Problem # 2:  PREMATURE VENTRICULAR CONTRACTIONS (ICD-427.69) stable Her updated medication list for this problem includes:    Benazepril-hydrochlorothiazide 20-25 Mg Tabs (Benazepril-hydrochlorothiazide) .[name]... 1 by mouth once daily  Problem # 3:  CARDIAC PACEMAKER IN SITU  (ICD-V45.01)  pacer pocket is will healed  Problem # 4:  BRADYCARDIA (ICD-427.89) stable post pacer Her updated medication list for this problem includes:    Benazepril-hydrochlorothiazide 20-25 Mg Tabs (Benazepril-hydrochlorothiazide) .[name]... 1 by mouth once daily  Patient Instructions: 1)  Your physician recommends that you continue on your current medications as directed. Please refer to the Current Medication list given to you today. 2)  Your physician wants you to follow-up in:   3 months You will receive a reminder letter in the mail two months in advance. If you don't receive a letter, please call our office to schedule the follow-up appointment.

## 2010-11-04 NOTE — Progress Notes (Signed)
Summary: APPT  Phone Note Call from Patient Call back at Home Phone [phone]   Caller: SELF Call For: Barbara Marks Summary of Call: PT IS NOT SURE IF SHE WAS SUPPOSED TO COME BACK IN A FEW WEEKS. Initial call taken by: Harlon Flor,  July 21, 2010 9:31 AM  Follow-up for Phone Call        Pacemaker was reprogramed and pt is unsure of when she needs to f/u. Please advise when she needs appt. Benedict Needy, RN  July 21, 2010 4:42 PM   Additional Follow-up for Phone Call Additional follow up Details #1::        [id number]  6 month followyup Additional Follow-up by: Nathen May, MD, [facility],  July 22, 2010 6:09 PM     Appended Document: APPT Notified patient [name] will see in 6 months but if for any reason needs to be seen earlier, she was instructed to call the office.

## 2010-11-04 NOTE — Op Note (Signed)
Summary: Consent for generator change out  Consent for generator change out   Imported By: Harlon Flor 03/14/2010 11:46:55  _____________________________________________________________________  External Attachment:    Type:   Image     Comment:   External Document

## 2010-11-04 NOTE — Procedures (Signed)
Summary: PT HAD LITHOTRIPSY ON 01/09/10 NEEDS PACER CHECK   Allergies: 1)  ! Codeine 2)  ! * Simvastatin   PPM Specifications Following MD:  Sherryl Manges, MD     PPM Vendor:  Medtronic     PPM Model Number:  [id number]     PPM Serial Number:  [id number] PPM DOI:  08/13/2005      Lead 1    Location: RA     DOI: 08/13/2005     Model #: 4076     Serial #: [id number]     Status: active Lead 2    Location: RV     DOI: 08/13/2005     Model #: 7829     Serial #: [id number]     Status: active  Magnet Response Rate:  BOL 85 ERI 65    PPM Follow Up Pacer Dependent:  No      Episodes Coumadin:  No  Parameters Mode:  DDDR+     Lower Rate Limit:  60     Upper Rate Limit:  130 Paced AV Delay:  240     Sensed AV Delay:  180

## 2010-11-04 NOTE — Progress Notes (Signed)
Summary: PACEMAKER  Phone Note Call from Patient Call back at Home Phone [phone]   Caller: SELF Call For: Maricarmen Braziel Summary of Call: WOULD LIKE TO KNOW WHEN SHE CAN SCHEDULE TO COME IN AND GET HER PACEMAKER REPLACED Initial call taken by: Harlon Flor,  Feb 03, 2010 3:09 PM  Follow-up for Phone Call        Scheduled pacer generator change out for [facility] on 02/24/10 at 1pm.  Called spoke with pt, aware of date with pre-op on 02/18/10 at 12:15. Follow-up by: Cloyde Reams RN,  Feb 04, 2010 2:17 PM  New Problems: CARDIAC PACEMAKER IN SITU (ICD-V45.01)   New Problems: CARDIAC PACEMAKER IN SITU (ICD-V45.01)

## 2010-11-04 NOTE — Letter (Signed)
Summary: Surgical Clearance   Surgical Clearance   Imported By: Harlon Flor 02/27/2010 15:21:17  _____________________________________________________________________  External Attachment:    Type:   Image     Comment:   External Document

## 2010-11-04 NOTE — Assessment & Plan Note (Signed)
Summary: EC6/AMD   Visit Type:  Initial Consult Primary Barbara Marks:  Barbara Marks  CC:  c/o pain in stomach and left arm.  She has a lot of depression and gets ECT treatments every 4-5 weeks.  Her husband recently deceased with pancreatic cancer.[name]  History of Present Illness: Barbara Marks is a 77 year old woman who presents today for routine followup. She has a history of PVCs, bradycardia associated with her PVCs, profound significant long history of depression and anxiety requiring ECT treatment dating back to the 80s, history of kidney stones with lithotripsy.  She reports that she has had rare episodes of chest discomfort. He continues to have ECT treatments for depression. Her depression and anxiety have been worse since her husband has passed away. She has had recent problems with kidney stones requiring lithotripsy on 18 August. She passed the stone. She reports having numbness in her feet, occasional abdominal pain in the middle of the night. She does not exercise. She does have the opportunity to go walking though has not been doing so.  she does not like the way that her pacemaker feels after her recent redo. She believes it is bigger, does not lie as flat. She can feel the edge of the pacer.  EKG shows normal sinus rhythm with left anterior fascicular block, rate of 91 beats per minute, frequent PVCs no significant ST or T wave changes. EKG is relatively unchanged.  Current Medications (verified): 1)  Potassium Chloride Crys Cr 20 Meq Cr-Tabs (Potassium Chloride Crys Cr) .... Take One Tablet By Mouth Daily 2)  Multivitamins   Tabs (Multiple Vitamin) .[name].. 1 By Mouth Once Daily 3)  Vitamin B Complex-C   Caps (B Complex-C) .[name].. 1 By Mouth Once Daily 4)  Cholesterol Management  Tabs (Nutritional Supplements) .[name].. 1 By Mouth Once Daily 5)  Co Q-10 150 Mg Caps (Coenzyme Q10) .[name].. 1 By Mouth Once Daily 6)  Glucosamine-Chondroitin  Caps (Glucosamine-Chondroit-Vit C-Mn) .... Take Once  Daily 7)  Vitamin D 1000 Unit Tabs (Cholecalciferol) .... 2 By Mouth Daily 8)  Cod Liver Oil  Caps (Cod Liver Oil) .... 3 By Mouth Daily 9)  Benazepril Hcl 20 Mg Tabs (Benazepril Hcl) .... One Tablet Once Daily 10)  Celexa 20 Mg Tabs (Citalopram Hydrobromide) .... One Tablet Once Daily  Allergies (verified): 1)  ! Codeine 2)  ! * Simvastatin  Past History:  Past Medical History: Last updated: 02/22/2009 DEPRESSION (ICD-311) PREMATURE VENTRICULAR CONTRACTIONS (ICD-427.69) HYPERLIPIDEMIA-MIXED (ICD-272.4) BRADYCARDIA (ICD-427.89) HYPERTENSION, UNSPECIFIED (ICD-401.9) PACEMAKER (ICD-V45.[name]01)    Past Surgical History: Last updated: 02/22/2009 Appendectomy - '52 tumors & nemamgeomas  gall bladder - '83 pacemaker - medtronic  Social History: Last updated: 02/22/2009 Retired  Married  Tobacco Use - No.  Alcohol Use - yes Drug Use - no  Risk Factors: Smoking Status: never (02/22/2009)  Review of Systems       The patient complains of chest pain, dyspnea on exertion, and abdominal pain.  The patient denies fatigue, malaise, fever, weight gain/loss, vision loss, decreased hearing, hoarseness, palpitations, shortness of breath, prolonged cough, wheezing, sleep apnea, coughing up blood, blood in stool, nausea, vomiting, diarrhea, heartburn, incontinence, blood in urine, muscle weakness, joint pain, leg swelling, rash, skin lesions, headache, fainting, dizziness, depression, anxiety, enlarged lymph nodes, easy bruising or bleeding, and environmental allergies.         fatigue  Vital Signs:  Patient profile:   77 year old female Height:      66 inches Weight:  151 pounds BMI:     24.46 Pulse rate:   91 / minute BP sitting:   128 / 81  (left arm) Cuff size:   regular  Vitals Entered By: Barbara Marks, CMA (June 13, 2010 3:12 PM)  Physical Exam  General:  Well developed, well nourished, elderly woman in no apparent distress Head:  normocephalic and  atraumatic Neck:  Neck supple, no JVD. No masses, thyromegaly or abnormal cervical nodes. Lungs:  Clear bilaterally to auscultation and percussion. Heart:  Non-displaced PMI, chest non-tender; regular rate and rhythm, S1, S2 without murmurs, rubs or gallops. Carotid upstroke normal, no bruit.  Pedals normal pulses. No edema, no varicosities. Abdomen:   abdomen soft and non-tender without masses Msk:  Back normal, normal gait. Muscle strength and tone normal. Pulses:  pulses normal in all 4 extremities Extremities:  No clubbing or cyanosis. Neurologic:  Alert and oriented x 3. Skin:  Intact without lesions or rashes. Psych:  Normal affect.   PPM Specifications Following MD:  Sherryl Manges, MD     PPM Vendor:  Medtronic     PPM Model Number:  ADDR01     PPM Serial Number:  [id number] PPM DOI:  02/24/2010      Lead 1    Location: RA     DOI: 08/13/2005     Model #: 4076     Serial #: [id number]     Status: active Lead 2    Location: RV     DOI: 08/13/2005     Model #: 4076     Serial #: [id number]     Status: active  Magnet Response Rate:  BOL 85 ERI 65  Indications:  Bradycardia  Explantation Comments:  02/24/2010 Medtronic Enrhythm P1501DR/[id number] h explanted.  PPM Follow Up Pacer Dependent:  No      Episodes Coumadin:  No  Parameters Mode:  DDD+     Lower Rate Limit:  60     Upper Rate Limit:  130 Paced AV Delay:  180     Sensed AV Delay:  150  Impression & Recommendations:  Problem # 1:  DEPRESSION (ICD-311) I feel that many of her symptoms on today's visit are secondary to her depression. After a long discussion and thorough exam, she seems to feel better. I have encouraged her to go walking with her lady friends in the afternoons as this may help her mood.  Problem # 2:  PREMATURE VENTRICULAR CONTRACTIONS (ICD-427.69) she continues to have PVCs and she is asymptomatic. No further workup is needed. She has a pacemaker. She is unhappy with the placement and feel of the  pacemaker  The following medications were removed from the medication list:    Benazepril-hydrochlorothiazide 20-25 Mg Tabs (Benazepril-hydrochlorothiazide) .[name]... 1 by mouth once daily Her updated medication list for this problem includes:    Benazepril Hcl 20 Mg Tabs (Benazepril hcl) ..... One tablet once daily  Problem # 3:  CHEST TIGHTNESS-PRESSURE-OTHER ([id number]) I feel that her chest discomfort is likely atypical. She did report an episode of abdominal pain though this is not frequent. Have asked her to go walking with her friends and to contact us if she has worsening chest discomfort. Workup would include stress testing.  Other Orders: EKG w/ Interpretation (93000)

## 2010-11-04 NOTE — Progress Notes (Signed)
Summary: SURGICAL CLEARANCE  Phone Note From Other Clinic Call back at [phone]   Caller: SARA Call For: Barbara Marks Summary of Call: DR COPE'S OFFICE NEEDS SURGICAL CLEARANCE FOR A RIGHT UU WITH HOLMIUM LASER-POSSIBLE STENT PLACEMENT IN KIDNEY Initial call taken by: Harlon Flor,  Feb 11, 2010 8:50 AM  Follow-up for Phone Call        Pt saw Dr Barbara Marks on 01/27/10.  Dr Cope's office requesting surgical clearance.  Please advise.  Thanks Follow-up by: Cloyde Reams RN,  Feb 11, 2010 2:37 PM     Appended Document: SURGICAL CLEARANCE Per Dr Barbara Marks, patient at acceptable cardiac risk for surgery.  Should wait until after PPM gen change.   Appended Document: SURGICAL CLEARANCE Note faxed to Dr Cope's office providing clearance, generator change out scheduled for 02/24/10.  EWJ

## 2010-11-05 ENCOUNTER — Ambulatory Visit: Payer: No Typology Code available for payment source | Admitting: Unknown Physician Specialty

## 2010-11-06 ENCOUNTER — Ambulatory Visit (INDEPENDENT_AMBULATORY_CARE_PROVIDER_SITE_OTHER): Payer: Medicare Other | Admitting: Internal Medicine

## 2010-11-06 ENCOUNTER — Encounter: Payer: Self-pay | Admitting: Internal Medicine

## 2010-11-06 ENCOUNTER — Ambulatory Visit: Payer: No Typology Code available for payment source | Admitting: Unknown Physician Specialty

## 2010-11-06 DIAGNOSIS — I498 Other specified cardiac arrhythmias: Secondary | ICD-10-CM

## 2010-11-06 DIAGNOSIS — R5381 Other malaise: Secondary | ICD-10-CM

## 2010-11-06 DIAGNOSIS — Z950 Presence of cardiac pacemaker: Secondary | ICD-10-CM

## 2010-11-06 DIAGNOSIS — I4949 Other premature depolarization: Secondary | ICD-10-CM

## 2010-11-12 NOTE — Assessment & Plan Note (Signed)
Summary: ROV/AMD   Visit Type:  Follow-up Primary Provider:  [name]  CC:  c/o feeling tired and weak for a long time with decrease heart rate and blood pressure and feet feel numb at times.  She has ECT about every four weeks. [name]  History of Present Illness: Barbara Marks is a 77 year old woman who presents today for routine followup. She has a history of CAD by cardiac cath in 03-14-03 with nonobstructive CAD, chronic PVCs, bradycardia associated with her PVCs, profound significant long history of depression and anxiety requiring ECT treatment dating back to the 80s, history of kidney stones with lithotripsy.  Her chest discomfort has significantly imporoved. She continues to have ECT treatments for depression. Her depression and anxiety have been worse since her husband has passed away in 03/13/2010. She has had recent problems with kidney stones requiring lithotripsy on 18 August. She passed the stone. She continues to have  numbness in her feet. She does not exercise.    EKG shows normal sinus rhythm with bigemeny with frequent PVCs, left anterior fascicular block, rate of 91 beats per minute,  no significant ST or T wave changes. EKG is relatively unchanged.      Current Medications (verified): 1)  Potassium Chloride Crys Cr 20 Meq Cr-Tabs (Potassium Chloride Crys Cr) .... Take One Tablet By Mouth Daily 2)  Multivitamins   Tabs (Multiple Vitamin) .[name].. 1 By Mouth Once Daily 3)  Vitamin B Complex-C   Caps (B Complex-C) .[name].. 1 By Mouth Once Daily 4)  Co Q-10 150 Mg Caps (Coenzyme Q10) .[name].. 1 By Mouth Once Daily 5)  Glucosamine-Chondroitin  Caps (Glucosamine-Chondroit-Vit C-Mn) .... Take Once Daily 6)  Vitamin D 1000 Unit Tabs (Cholecalciferol) .... 2 By Mouth Daily 7)  Olanzapine 5 Mg Tbdp (Olanzapine) .... One Tablet At Bedtime For Depression 8)  Lisinopril-Hydrochlorothiazide 20-12.5 Mg Tabs (Lisinopril-Hydrochlorothiazide) .... One Tablet Once Daily 9)  Simvastatin 20 Mg Tabs (Simvastatin)  .... One Tablet At Bedtime  Allergies (verified): 1)  ! Codeine 2)  ! * Simvastatin  Past History:  Past Medical History: Last updated: 02/22/2009 DEPRESSION (ICD-311) PREMATURE VENTRICULAR CONTRACTIONS (ICD-427.69) HYPERLIPIDEMIA-MIXED (ICD-272.4) BRADYCARDIA (ICD-427.89) HYPERTENSION, UNSPECIFIED (ICD-401.9) PACEMAKER (ICD-V45.[name]01)    Past Surgical History: Last updated: 02/22/2009 Appendectomy - 2051/03/14 tumors & nemamgeomas  gall bladder - 03/13/1982 pacemaker - medtronic  Social History: Last updated: 02/22/2009 Retired  Married  Tobacco Use - No.  Alcohol Use - yes Drug Use - no  Risk Factors: Smoking Status: never (02/22/2009)  Review of Systems  The patient denies fever, weight loss, weight gain, vision loss, decreased hearing, hoarseness, chest pain, syncope, dyspnea on exertion, peripheral edema, prolonged cough, abdominal pain, incontinence, muscle weakness, depression, and enlarged lymph nodes.         Fatigue, malaise, depression  Vital Signs:  Patient profile:   77 year old female Height:      66 inches Weight:      152 pounds BMI:     24.62 Pulse rate:   47 / minute BP sitting:   137 / 74  (left arm) Cuff size:   regular  Vitals Entered By: Bishop Dublin, CMA (November 03, 2010 11:33 AM)  Physical Exam  General:  Well developed, well nourished, in no acute distress. Head:  normocephalic and atraumatic Neck:  Neck supple, no JVD. No masses, thyromegaly or abnormal cervical nodes. Lungs:  Clear bilaterally to auscultation and percussion. Heart:  Non-displaced PMI, chest non-tender; Irregular rate and rhythm, S1, S2 with II/VI SEM  RSB,  no rubs or gallops. Carotid upstroke normal, no bruit.  Pedals normal pulses. No edema, no varicosities. Abdomen:  Bowel sounds positive; abdomen soft and non-tender without masses Msk:  Back normal, normal gait. Muscle strength and tone normal. Pulses:  pulses normal in all 4 extremities Extremities:  No clubbing or  cyanosis. Neurologic:  Alert and oriented x 3. Skin:  Intact without lesions or rashes. Psych:  Normal affect.   PPM Specifications Following MD:  Sherryl Manges, MD     PPM Vendor:  Medtronic     PPM Model Number:  ADDR01     PPM Serial Number:  [id number] PPM DOI:  02/24/2010      Lead 1    Location: RA     DOI: 08/13/2005     Model #: 4076     Serial #: [id number]     Status: active Lead 2    Location: RV     DOI: 08/13/2005     Model #: 4076     Serial #: [id number]     Status: active  Magnet Response Rate:  BOL 85 ERI 65  Indications:  Bradycardia  Explantation Comments:  02/24/2010 Medtronic Enrhythm P1501DR/[id number] h explanted.  PPM Follow Up Pacer Dependent:  No      Episodes Coumadin:  No  Parameters Mode:  DDI     Lower Rate Limit:  40     Upper Rate Limit:  130 Paced AV Delay:  300     Sensed AV Delay:  150 Rate Response Parameters:  PVARP  Impression & Recommendations:  Problem # 1:  PREMATURE VENTRICULAR CONTRACTIONS (ICD-427.69) bigeminy seen on EKG is contributing to her low heart rate when measured by blood pressure cuff. Heart rate actually in the 90s. She is relatively asymptomatic but does have chronic fatigue likely secondary to depression. I'm hesitant to start any rate or rhythm controlling medicines given her long history of depression as this could exacerbate her symptoms. I will have her discuss this with [name].  The following medications were removed from the medication list:    Benazepril Hcl 20 Mg Tabs (Benazepril hcl) ..... One tablet once daily Her updated medication list for this problem includes:    Lisinopril-hydrochlorothiazide 20-12.5 Mg Tabs (Lisinopril-hydrochlorothiazide) ..... One tablet once daily    Aspirin 81 Mg Tbec (Aspirin) .[name]... Take two tablets once daily  Problem # 2:  HYPERLIPIDEMIA-MIXED (ICD-272.4) She does have a remote cardiac catheterization showing nonobstructive coronary artery disease. She is currently on a  statin. We will start aspirin 81 mg x2.  Her updated medication list for this problem includes:    Simvastatin 20 Mg Tabs (Simvastatin) ..... One tablet at bedtime  Problem # 3:  CAD, NATIVE VESSEL (ICD-414.01) Continue lipid management. No further testing as she is relatively asymptomatic.  The following medications were removed from the medication list:    Benazepril Hcl 20 Mg Tabs (Benazepril hcl) ..... One tablet once daily Her updated medication list for this problem includes:    Lisinopril-hydrochlorothiazide 20-12.5 Mg Tabs (Lisinopril-hydrochlorothiazide) ..... One tablet once daily    Aspirin 81 Mg Tbec (Aspirin) .[name]... Take two tablets once daily  Problem # 4:  CARDIAC PACEMAKER IN SITU (ICD-V45.01) Pacer previously placed in the [place] for bradycardia and PVCs. She is relatively asymptomatic.  Patient Instructions: 1)  Your physician recommends that you schedule a follow-up appointment in: 6 months. 2)  Your physician has recommended you make the following change in your medication: Start taking  aspirin 81 mg two tablet once daily.

## 2010-11-20 NOTE — Assessment & Plan Note (Signed)
Summary: ROV;WANTS PACEMAKER CHECKED/AMD   Vital Signs:  Patient profile:   77 year old female Height:      66 inches Weight:      152.75 pounds BMI:     24.74 Pulse rate:   60 / minute BP sitting:   147 / 88  (left arm) Cuff size:   regular  Vitals Entered By: Lysbeth Galas CMA (November 06, 2010 3:39 PM)   Visit Type:  Follow-up Primary Provider:  [name]  CC:  c/o irregular heart rate and low heart rate.[name]  History of Present Illness: Barbara Marks is seen in followup for fatigue and apparent bradycardia. She is status post pacemaker implantation. Her primary care physician identified slow heart rates at her last visit. He prefers for further evaluation.  Her past cardiac history is notable for a 77 year old woman who presents today for  cardiac cath in 2004 with nonobstructive CAD, , bradycardia associated with her PVCs, profound significant long history of depression and anxiety requiring ECT treatment dating back to the 80s, history of kidney stones with lithotripsy.  She continues to have ECT. She has continued to struggled since the death of her husband.  EKG shows normal sinus rhythm with bigemeny with frequent PVCs, left anterior fascicular block, rate of 91 beats per minute,  no significant ST or T wave changes. EKG is relatively unchanged.      Current Medications (verified): 1)  Potassium Chloride Crys Cr 20 Meq Cr-Tabs (Potassium Chloride Crys Cr) .... Take One Tablet By Mouth Daily 2)  Multivitamins   Tabs (Multiple Vitamin) .[name].. 1 By Mouth Once Daily 3)  Vitamin B Complex-C   Caps (B Complex-C) .[name].. 1 By Mouth Once Daily 4)  Co Q-10 150 Mg Caps (Coenzyme Q10) .[name].. 1 By Mouth Once Daily 5)  Glucosamine-Chondroitin  Caps (Glucosamine-Chondroit-Vit C-Mn) .... Take Once Daily 6)  Vitamin D 1000 Unit Tabs (Cholecalciferol) .... 2 By Mouth Daily 7)  Olanzapine 5 Mg Tbdp (Olanzapine) .... One Tablet At Bedtime For Depression 8)  Lisinopril-Hydrochlorothiazide  20-12.5 Mg Tabs (Lisinopril-Hydrochlorothiazide) .... One Tablet Once Daily 9)  Simvastatin 20 Mg Tabs (Simvastatin) .... One Tablet At Bedtime 10)  Aspirin 81 Mg Tbec (Aspirin) .... Take Two Tablets Once Daily  Allergies (verified): 1)  ! Codeine 2)  ! * Simvastatin  Past History:  Past Medical History: Last updated: 02/22/2009 DEPRESSION (ICD-311) PREMATURE VENTRICULAR CONTRACTIONS (ICD-427.69) HYPERLIPIDEMIA-MIXED (ICD-272.4) BRADYCARDIA (ICD-427.89) HYPERTENSION, UNSPECIFIED (ICD-401.9) PACEMAKER (ICD-V45.[name]01)    Past Surgical History: Last updated: 02/22/2009 Appendectomy - '52 tumors & nemamgeomas  gall bladder - '83 pacemaker - medtronic  Social History: Last updated: 02/22/2009 Retired  Married  Tobacco Use - No.  Alcohol Use - yes Drug Use - no  Risk Factors: Smoking Status: never (02/22/2009)  Physical Exam  General:  The patient was alert and oriented in no acute distress. per palpable pulse was a significant fraction reduction from her auscultated pulse HEENT Normal.  Neck veins were flat, carotids were brisk.  Lungs were clear.  Heart sounds were regularly regular without murmurs or gallops.  Abdomen was soft with active bowel sounds. There is no clubbing cyanosis or edema. Skin Warm and dry    Impression & Recommendations:  Problem # 1:  PREMATURE VENTRICULAR CONTRACTIONS (ICD-427.69) By pacemaker count she has at least 25% PVCs. Interestingly on inspection this afternoon she was probably in bigeminy with close coupling interval which pacemaker fails to define as a PVC as the coupling interval is 75% of RR  not 70% of RR. She had a negative [facility] at [facility] in 2010. We will try once a therapy for suppression of PVCs. Given her resting tachycardia with a sinus rate of about 100, we'll start with Rythmol trying 225 milligrams twice daily she'll let us know in a couple weeks how she is feeling. In the event that this worsens her symptoms we will  discontinue it and try flecainide 50 mg twice daily.  we spent a great deal of time in talking about the physiology of the PVCs were then 50% of our 33 minute visit  This may well be contributing to her fatigue Her updated medication list for this problem includes:    Lisinopril-hydrochlorothiazide 20-12.5 Mg Tabs (Lisinopril-hydrochlorothiazide) ..... One tablet once daily    Aspirin 81 Mg Tbec (Aspirin) .[name]... Take two tablets once daily  Problem # 2:  CARDIAC PACEMAKER IN SITU (ICD-V45.01) Device parameters and data were reviewed and no changes were made  Patient Instructions: 1)  Your physician recommends that you schedule a follow-up appointment in: 6 months 2)  Your physician has recommended you make the following change in your medication: START Propafenone 225mg  two times a day. Then call our office in 10-14 days to let us know how you are doing. Prescriptions: PROPAFENONE HCL 225 MG TABS (PROPAFENONE HCL) Take one tablet two times a day  #60 x 6   Entered by:   Lanny Hurst RN   Authorized by:   Nathen May, MD, [facility]   Signed by:   Lanny Hurst RN on 11/06/2010   Method used:   Electronically to        [id number] (retail)       [address]       [place], [place]  [address]       Ph: [phone]       Fax: [phone]   RxID:   [id number]       PPM Specifications Following MD:  Sherryl Manges, MD     PPM Vendor:  Medtronic     PPM Model Number:  ADDR01     PPM Serial Number:  [id number] PPM DOI:  02/24/2010      Lead 1    Location: RA     DOI: 08/13/2005     Model #: 4076     Serial #: [id number]     Status: active Lead 2    Location: RV     DOI: 08/13/2005     Model #: 4076     Serial #: [id number]     Status: active  Magnet Response Rate:  BOL 85 ERI 65  Indications:  Bradycardia  Explantation Comments:  02/24/2010 Medtronic Enrhythm P1501DR/[id number] h explanted.  PPM Follow Up Remote Check?  No Battery Voltage:  2.79 V     Battery  Est. Longevity:  12 years     Pacer Dependent:  No       PPM Device Measurements Atrium  Amplitude: 4.0 mV, Impedance: 329 ohms, Threshold: 0.75 V at 0.4 msec Right Ventricle  Amplitude: 4.0 mV, Impedance: 459 ohms, Threshold: 1.0 V at 0.4 msec  Episodes MS Episodes:  0     Percent Mode Switch:  0     Coumadin:  No Ventricular High Rate:  1     Atrial Pacing:  3.1%     Ventricular Pacing:  1.5%  Parameters Mode:  DDI     Lower Rate Limit:  40     Upper Rate Limit:  130 Paced AV Delay:  300     Sensed AV Delay:  150 Rate Response Parameters:  PVARP Next Cardiology Appt Due:  05/06/2011 Tech Comments:  No parameter changes.  Device function normal.  Barbara Marks c/o fatigue.  1,610,960 single PVC's .  1VHR episode 3 seconds.  ROV 6 months with [name] in [place]. Altha Harm, LPN  November 06, 2010 3:54 PM

## 2010-11-24 ENCOUNTER — Encounter: Payer: Self-pay | Admitting: Internal Medicine

## 2010-11-28 ENCOUNTER — Ambulatory Visit (INDEPENDENT_AMBULATORY_CARE_PROVIDER_SITE_OTHER): Payer: Medicare Other | Admitting: Internal Medicine

## 2010-11-28 ENCOUNTER — Encounter: Payer: Self-pay | Admitting: Internal Medicine

## 2010-11-28 DIAGNOSIS — I4949 Other premature depolarization: Secondary | ICD-10-CM

## 2010-11-28 DIAGNOSIS — R740 Nonspecific elevation of levels of transaminase and lactic acid dehydrogenase [LDH]: Secondary | ICD-10-CM

## 2010-12-02 ENCOUNTER — Encounter: Payer: Self-pay | Admitting: Internal Medicine

## 2010-12-02 NOTE — Assessment & Plan Note (Signed)
Summary: [facility]   Visit Type:  Follow-up Primary Provider:  [name]  CC:  c/o constipation & was told by PCP liver enzymes elevated.[name]  History of Present Illness: Barbara Marks is seen in followup for fatigue and apparent bradycardia. She is status post pacemaker implantation. Her primary care physician identified slow heart rates at her last visit.   At her last visit we discussed treating the PVCs as they might be causing in contributing to her fatigue. This is true knot was ineffective she has had them for many many years. Given that she had nonobstructive coronary disease we chose to Rythmol.  About a month before this he was started on simvastatin by Dr. Terance Hart. Blood work is drawn earlier this week demonstrated a modest abnormality of her ALT-48 (high normal 38) AST was normal. the patient notes no significant change in her symptoms.     Current Medications (verified): 1)  Potassium Chloride Crys Cr 20 Meq Cr-Tabs (Potassium Chloride Crys Cr) .... Take One Tablet By Mouth Daily 2)  Multivitamins   Tabs (Multiple Vitamin) .[name].. 1 By Mouth Once Daily 3)  Vitamin B Complex-C   Caps (B Complex-C) .[name].. 1 By Mouth Once Daily 4)  Co Q-10 150 Mg Caps (Coenzyme Q10) .[name].. 1 By Mouth Once Daily 5)  Glucosamine-Chondroitin  Caps (Glucosamine-Chondroit-Vit C-Mn) .... Take Once Daily 6)  Vitamin D 1000 Unit Tabs (Cholecalciferol) .... 2 By Mouth Daily 7)  Olanzapine 5 Mg Tbdp (Olanzapine) .... One Tablet At Bedtime For Depression 8)  Lisinopril-Hydrochlorothiazide 20-12.5 Mg Tabs (Lisinopril-Hydrochlorothiazide) .... One Tablet Once Daily 9)  Simvastatin 20 Mg Tabs (Simvastatin) .... One Tablet At Bedtime 10)  Aspirin 81 Mg Tbec (Aspirin) .... Take Two Tablets Once Daily 11)  Propafenone Hcl 225 Mg Tabs (Propafenone Hcl) .... Take One Tablet Two Times A Day 12)  Amlodipine Besylate 5 Mg Tabs (Amlodipine Besylate) .... Take One Tablet By Mouth Daily  Allergies (verified): 1)   ! Codeine 2)  ! * Simvastatin  Past History:  Past Medical History: Last updated: 02/22/2009 DEPRESSION (ICD-311) PREMATURE VENTRICULAR CONTRACTIONS (ICD-427.69) HYPERLIPIDEMIA-MIXED (ICD-272.4) BRADYCARDIA (ICD-427.89) HYPERTENSION, UNSPECIFIED (ICD-401.9) PACEMAKER (ICD-V45.[name]01)    Past Surgical History: Last updated: 02/22/2009 Appendectomy - '52 tumors & nemamgeomas  gall bladder - '83 pacemaker - medtronic  Social History: Last updated: 02/22/2009 Retired  Married  Tobacco Use - No.  Alcohol Use - yes Drug Use - no  Risk Factors: Smoking Status: never (02/22/2009)  Vital Signs:  Patient profile:   77 year old female Height:      66 inches Weight:      156 pounds BMI:     25.27 Pulse rate:   80 / minute BP sitting:   104 / 62  (left arm) Cuff size:   regular  Vitals Entered By: Barbara Marks, CMA (November 28, 2010 10:14 AM)  Physical Exam  General:  The patient was alert and oriented in no acute distress. HEENT Normal.  Neck veins were flat, carotids were brisk.  Lungs were clear.  Heart sounds were irregular without murmurs or gallops.  Abdomen was soft with active bowel sounds. There is no clubbing cyanosis or edema. Skin Warm and dry    PPM Specifications Following MD:  Sherryl Manges, MD     PPM Vendor:  Medtronic     PPM Model Number:  ADDR01     PPM Serial Number:  [id number] PPM DOI:  02/24/2010      Lead 1    Location: RA  DOI: 08/13/2005     Model #: 1610     Serial #: [id number]     Status: active Lead 2    Location: RV     DOI: 08/13/2005     Model #: 1191     Serial #: [id number]     Status: active  Magnet Response Rate:  BOL 85 ERI 65  Indications:  Bradycardia  Explantation Comments:  02/24/2010 Medtronic Enrhythm P1501DR/[id number] h explanted.  PPM Follow Up Pacer Dependent:  No      Episodes Coumadin:  No  Parameters Mode:  DDI     Lower Rate Limit:  40     Upper Rate Limit:  130 Paced AV Delay:  300     Sensed AV Delay:   150 Rate Response Parameters:  PVARP  Impression & Recommendations:  Problem # 1:  TRANSAMINASES, SERUM, ELEVATED (ICD-790.4) as noted her ALT is abnormal. I reviewed what I could find on Rythmol; notwithstanding that it is metabolized in the liver, I could find no source of it being associated with transaminase elevation. Hence I suspect the culprit here is the simvastatin. 3 initiation blood work did not include an ALT. I suggested that she discontinue the simvastatin at this point when she sees Dr. Terance Hart in 2 weeks repeat blood work which is scheduled will hopefully be clarifying  Problem # 2:  PREMATURE VENTRICULAR CONTRACTIONS (ICD-427.69) we will continue her on her Rythmol at this time. Her updated medication list for this problem includes:    Lisinopril-hydrochlorothiazide 20-12.5 Mg Tabs (Lisinopril-hydrochlorothiazide) ..... One tablet once daily    Aspirin 81 Mg Tbec (Aspirin) .[name]... Take two tablets once daily    Propafenone Hcl 225 Mg Tabs (Propafenone hcl) .[name]... Take one tablet two times a day    Amlodipine Besylate 5 Mg Tabs (Amlodipine besylate) .[name]... Take one tablet by mouth daily  Patient Instructions: 1)  Your physician recommends that you follow up with Dr. Terance Hart in 2 weeks. 2)  Your physician has recommended you make the following change in your medication: STOP Simvastatin.

## 2010-12-04 ENCOUNTER — Ambulatory Visit: Payer: No Typology Code available for payment source | Admitting: Unknown Physician Specialty

## 2010-12-11 NOTE — Letter (Signed)
Summary: External Other  External Other   Imported By: Lysbeth Galas CMA 12/02/2010 14:45:56  _____________________________________________________________________  External Attachment:    Type:   Image     Comment:   External Document

## 2011-01-06 ENCOUNTER — Ambulatory Visit: Payer: No Typology Code available for payment source | Admitting: Unknown Physician Specialty

## 2011-01-15 ENCOUNTER — Ambulatory Visit: Payer: Medicare Other | Admitting: Family Medicine

## 2011-02-03 ENCOUNTER — Ambulatory Visit: Payer: No Typology Code available for payment source | Admitting: Unknown Physician Specialty

## 2011-02-17 NOTE — Assessment & Plan Note (Signed)
[facility] OFFICE NOTE   Barbara Marks, Barbara Marks                        MRN:          [id number]  DATE:06/22/2007                            DOB:          1934/04/30    [name] returns today to discuss the findings of her stress nuclear  study.  She said the Adenosine was the worse 6 minutes she had ever had  in her entire life, including having babies!   Despite the side effect from Adenosine, her EF is 63% with normal  contractility taking all areas of the myocardium.  There was no ischemia  or scar.   Her blood pressure today is 142/82, her pulse is 64 and regular,  respiratory rate is 18.  The rest of her exam was unchanged.   She saw [name] the other day in the pacer clinic.  [name] is  worried about PVCs from the right ventricular outflow track, evidence of  poor ventricular sensing, cross talk with atrial oversensing.   She obtained a chest x-ray today which I have a preliminary copy of.  Per my review, the actual lead looks adequate and in place.  The right  ventricular lead looks a little high up on the septum.  I will have Dr.  Graciela Husbands review it, as well as the radiology interpretation.  I discussed  this with the patient.   She has a followup with [name] in the next couple of weeks.   I made no changes in her program today.  I will see her back in a year.     Thomas C. Daleen Squibb, MD, [facility]  Electronically Signed    TCW/MedQ  DD: 06/22/2007  DT: 06/22/2007  Job #: [id number]

## 2011-02-17 NOTE — Progress Notes (Signed)
[facility] OFFICE NOTE   Barbara Marks, Barbara Marks                        MRN:          [id number]  DATE:07/26/2007                            DOB:          1934/07/02    HISTORY OF PRESENT ILLNESS:  Barbara Marks had been seen in the previous  month.  She was to undergo ECG.  She has a history of a previously  implanted Medtronic pacemaker that had a variety of issues (see below).   In one of the discussions that we had was whether we would revise the  pacemaker system given the previous identified issues of poor  ventricular sensing cross talked with atrial sensing and the latter of  which results in probably false positive atrial fibrillation detections.   I should note that her previously implanted device is Medtronic in  rhythm.  She was atrial paced 11% of the time when I saw her.   The rest of her data are not available.   Her physical examination data are also not available except her vital  signs are available on the old chart but not available at the time of  this dictation. Her lungs were clear.  Her heart sounds were noted to be  irregular at that visit and her extremities were without edema.   She was scheduled for device follow up per protocol.     Duke Salvia, MD, [facility]  Electronically Signed    SCK/MedQ  DD: 09/21/2007  DT: 09/21/2007  Job #: [id number]

## 2011-02-17 NOTE — Progress Notes (Signed)
[facility]                  [place] [facility] OFFICE NOTE   [name], [name]                        MRN:          [id number]  DATE:02/27/2009                            DOB:          May 26, 1934    HISTORY OF PRESENT ILLNESS:  Barbara Marks was seen in followup for  pacemaker implanted for unclear indications in [facility] who also has a  history of PVCs.  She has chronic depression and is doing pretty well at  this time with ongoing electroconvulsive therapy.  There has been some  issues with her pacemaker in the past including poor ventricular sensing  crossed off with atrial oversensing.  Overall her major complaint now is  fatigue, which she thinks is related to her depression.   MEDICATIONS:  Reviewed and are changed.   PHYSICAL EXAMINATION:  VITAL SIGNS:  Stable.  GENERAL:  She was in no acute distress.  SKIN:  Warm and dry.  LUNGS:  Clear.  HEART:  Her heart sounds were regular.  ABDOMEN:  Soft.  EXTREMITIES:  Without edema.   Interrogation of her EnRhythm pulse generator demonstrates that she is  50% atrial paced and only 5% ventricularly paced down from 60 before.  Other parameters per EMR.   IMPRESSION:  1. Premature ventricular contractions.  2. Sinus node dysfunction with atrial pacing.  3. Lead issues with her pacemaker as noted above.  4. Depression with ongoing electroconvulsive therapy.   Barbara Marks was doing quite well at this time.  We will make no changes.  We will see her again in 6 months' time.     Duke Salvia, MD, [facility]  Electronically Signed    SCK/MedQ  DD: 02/27/2009  DT: 02/28/2009  Job #: [id number]

## 2011-02-17 NOTE — Letter (Signed)
June 20, 2007    Jesse Sans. Wall, MD, FACC  [address]  [address]  [place], [place] [address]   RE:  Barbara Marks, Barbara Marks  MRN:  [id number]  /  DOB:  11-14-1933   Dear Elijah Birk:   It is a pleasure to see Barbara Marks today to establish  pacemaker followup.   As you know, she is a lady with significant depression and anxiety who  has been getting monthly ECT here at [facility].  She had a pacemaker  implanted in [place] in 2006 because of slow heart rate and lack of  energy.  She feels like she got no better following its implantation.   She does note that following ECT at [facility] she would get her device  interrogated; she notes that that has not been done here.  (See below).   She had problems with chest pain and had a catheterization in the past.  She had multiple cardiac risk factors and with recurrent chest pain she  is set up for stress test tomorrow.   PAST MEDICAL HISTORY:  Notable for:  1. Hypertension.  2. Anxiety.  3. PVCs with an RVOT origin.  4. Pulmonary sarcoid.   PAST SURGICAL HISTORY:  Notable for:  1. Appendectomy.  2. Hemangioma surgery.  3. Cholecystectomy.  4. Lung biopsy for sarcoid.   CURRENT MEDICATIONS:  1. Captopril 20 t.i.d.  2. HydroDIURIL 25.  3. Potassium 20.  4. Prozac 20 b.i.d.  5. A variety of other neutraceuticals.   She has no known drug allergies.  SHE IS INTOLERANT OF CODEINE.   SOCIAL HISTORY:  1. She is married.  2. She does not use cigarettes or recreational drugs.  3. She does drink alcohol very rarely.   EXAMINATION:  She is an older Caucasian female appearing somewhat  younger than her stated age of 39.  Her blood pressure is 139/62, her  pulse is 52.  HEENT EXAM:  No icterus or xanthomata.  NECK VEINS:  Flat.  CAROTIDS:  Brisk and full bilaterally without bruits.  BACK:  Without kyphosis or scoliosis.  LUNGS:  Clear.  HEART SOUNDS:  Irregular with an early systolic murmur.  ABDOMEN:  Soft with active  bowel sounds.  EXTREMITIES:  Without edema.  NEUROLOGICAL EXAM:  Grossly normal.  SKIN:  Warm and dry.   Electrocardiogram dated April 05, 2007, demonstrated sinus rhythm with  ventricular bigeminy and then trigeminy with PVCs with a left bundle  branch block, inferior axis morphology with a transition between V2 and  V3, consistent with an RVOT origin.   Interrogation of her pacemaker demonstrated the presence of a Medtronic  EnRhythm device.  The battery voltage was 2.95.  Ventricular sensitivity  was very poor, with R waves of about 1 millivolt.  There is atrial  oversensing via ventricular pacing cross-talk.  Channels were consistent  with the above.   IMPRESSION:  1. History of bradycardia status post pacemaker implantation, now      with:      a.     Evidence of poor ventricular sensing.      b.     Cross-talk with atrial oversensing.      c.     Sixty percent ventricular pacing.  2. Premature ventricular contractions from the right ventricular      outflow tract.  3. Atypical chest pain.  4. Depression with ECT therapy.   Tom, Ms. Warman-Turley's pacemaker is having problems with ventricular  sensing.  I have  reprogrammed the device into an A-D mode to see to what  degree the ventricular pacer is required.   In addition, I spoke with [employer], noise reversion from ECT has been  reported with these devices.  This should be manifest, if it were to  occur, with ventricular pacing at 65, so I will let Dr. Alycia Rossetti know that  if she ends up pacing at 67 following ECT that reversion has occurred  and we should be contacted so as to allow for reprogramming of her  device.   We will plan to obtain a chest x-ray, when she goes for her stress test  tomorrow, to make sure there is no evidence of lead dislodgement.   I should note that Ms. Mccorkel-Turley had multiple questions as to whether  her pacemaker could come out at all.  I told her I did not know why it  was put in, so it would  be a little bit hard to say that but there is  evidence that there is an issue with the right ventricular lead and  sensing and it may well need to be revised down the road.   Thank very much for your asking [place] to see her.    Sincerely,      Duke Salvia, MD, [facility]  Electronically Signed    SCK/MedQ  DD: 06/20/2007  DT: 06/20/2007  Job #: [id number]   CC:    Greer Ee

## 2011-02-17 NOTE — Assessment & Plan Note (Signed)
[facility] OFFICE NOTE   AANVI, Marks                        MRN:          [id number]  DATE:06/15/2007                            DOB:          June 07, 1934    [name] is a 77 year old married white female who comes today  self-referred for followup concerning her pacemaker, nonobstructive  coronary artery disease, and chest discomfort.  She has been having some  chest tightness that comes and goes.  This is not always with activity,  in fact, most times at rest.   She has been evaluated by [facility] for this.  Apparently, she  was told she had anxiety.  She does have a history of severe depression  and is seeing a Dr. Alycia Rossetti at [facility].  She has moved here from  [facility] to have ongoing treatment for this.  This includes ECT.   She had apparently a catheterization in about 2004 in [place],  [place], where her son-in-law is a perfusionist.  She had had a  cardiac CT or a cardiac calcium CT study which showed some calcification  and question of nonobstructive plaque.  She underwent a cardiac  catheterization and was told she had a 30% stenosis in her front vessel.   Her risk factors include age, sex, hyperlipidemia with an intolerance to  Zocor with muscle aches.  Her last cholesterol was 244, HDL 62, LDL 167,  triglycerides 73.  She is taking red yeast rice, but refuses a statin at  this point.   Her pacemaker was apparently put in [place]. [place] several years ago.  It was  put in for, she says, slow heart rate and her heart rate not increasing  with activity.  She was also suffering from extreme fatigue.   PAST MEDICAL HISTORY:  She has a history of hypertension.   ALLERGIES:  She has no dye allergies.  She is intolerant of CODEINE.   SOCIAL HISTORY:  She has never smokes and she drinks about 3-4 alcoholic  beverages per year or times per year.  She enjoys walking, but has  not  been doing this regularly.  She used to snorkel a lot in [place]. [place].   CURRENT MEDICATIONS:  1. Captopril 20 mg p.o. t.i.d.  2. HydroDIURIL 25 mg a day.  3. Potassium 20 mEq a day.  4. Prozac 20 mg p.o. b.i.d.  5. Vitamin C.  6. Chromium.  7. Glucosamine and chondroitin two daily.  8. Vitamin B.  9. Krill oil.  10.Multivitamin daily.  11.Selenium 200 mcg a day.  12.Cholesterol block 600 mg a day.   PREVIOUS SURGERIES:  1. Appendectomy in 1952.  2. Lung biopsy for sarcoid in 1977.  3. Tumors and hemangiomas.  4. Cholecystectomy in 1983.   FAMILY HISTORY:  Negative for premature coronary disease.   SOCIAL HISTORY:  She is retired.  She is married and has 3 children.  She just moved to this area from [place]. [place], [place].   REVIEW OF SYSTEMS:  Other than the HPI, she suffers from chemical  imbalance,  as mentioned above.   PHYSICAL EXAMINATION:  She is very pleasant.  She is 5 feet 7 inches and weighs 190 pounds.  Blood pressure is 148/82, pulse 62 and irregular.  HEENT:  Normocephalic, atraumatic.  PERRLA.  Extraocular movements  intact.  Sclerae are clear.  Facial symmetry is normal.  Carotid upstrokes are equal bilaterally without bruits.  There is no  JVD.  Thyroid is not enlarged.  Trachea is midline.  LUNGS:  Clear to auscultation and percussion.  HEART:  Reveals a nondisplaced PMI.  She has an irregular rate and  rhythm with ventricular bigeminy by exam.  S1 splits physiologically.  She has a soft systolic murmur.  ABDOMEN:  Soft.  Good bowel sounds.  No midline bruits.  EXTREMITIES:  Reveal no cyanosis, clubbing or edema.  Pulses are intact.  NEUROLOGIC:  Exam is intact.   ASSESSMENT:  1. Chest tightness in the setting of history of nonobstructive      coronary artery disease, rule out progressive obstructive disease.  2. Frequent premature ventricular contractions, which has been a long      problem for her.  3. Hyperlipidemia with an intolerance to  Zocor and reluctant to take      other statins.  4. Hypertension, under relatively good control outside the office.  5. History of depression, currently undergoing electroconvulsive      therapy treatments.   PLAN:  Exercise rest/stress Myoview to rule out obstructive coronary  artery disease.   I will see her back after the study to answer further questions.  We  will also arrange for her to be followed in the pacer clinic with Dr.  Graciela Husbands.     Thomas C. Daleen Squibb, MD, [facility]  Electronically Signed    TCW/MedQ  DD: 06/15/2007  DT: 06/16/2007  Job #: [id number]   cc:   [facility], [facility], [place],  [place] Alycia Rossetti, Al Corpus, MD

## 2011-02-17 NOTE — Progress Notes (Signed)
[facility] OFFICE NOTE   THURZA, KWIECINSKI                        MRN:          [id number]  DATE:07/16/2008                            DOB:          Sep 09, 1934    HISTORY OF PRESENT ILLNESS:  Ms. Basara is seen in followup for pacemaker  implanted at [facility].  Her question is should it have been implanted  in the first place.  It was put in because of bradycardia presumably and  fatigue, but the fatigue persists.  She notes that her heart rate  sometimes is 40 and she has known ventricular ectopy.   She is concerned about her cholesterol medication and has stopped her  statin therapy.  She was on Red Yeast Rice for a while and cholesterol  blockages.  She is going to think about stopping those as well.  We  reviewed her cholesterol numbers and I suggest that she talk with Dr.  Terance Hart about the benefits of primary prevention therapy.   MEDICATIONS:  Currently are notable for benazepril, HCTZ, statin, Prozac  20, CoQ10, and potassium.   PHYSICAL EXAMINATION:  VITAL SIGNS:  Her blood pressure is well  controlled, this was seen at 118/60, her pulse was 56.  LUNGS:  Clear.  HEART:  Her heart sounds were regular.  EXTREMITIES:  Without edema.   Interrogation of Medtronic device demonstrates a P-wave of 2 with  impedance of 332 with threshold of 1 volt at 0.5.  The R-wave is 2.7,  impedance of 392 a threshold 1 volt at 0.4.  Battery voltage 2.78.  The  R-wave is chronically low.  She is ventricularly paced 48% of the time  and atrially paced 6.9% of the time.   IMPRESSION:  1. Bradycardia status post pacemaker implantation.  2. Hypertension - Improved.  3. Premature ventricular contractions.  4. Chronic depression.  5. Dyslipidemia.   Ms. Luedke is stable from the arrhythmia point of view.  I have asked  that she discuss with Dr. Terance Hart the role of her statin therapy and  its value with  primary prevention.   We will see her again in 6 months' time.     Duke Salvia, MD, [facility]  Electronically Signed    SCK/MedQ  DD: 07/16/2008  DT: 07/16/2008  Job #: [id number]   cc:   Demetrios Loll

## 2011-02-17 NOTE — Assessment & Plan Note (Signed)
[facility] OFFICE NOTE   Barbara, Marks                        MRN:          [id number]  DATE:06/18/2008                            DOB:          09-21-1934    Barbara Marks returns today for further management of her frequent  ventricular ectopy.  She saw Barbara Marks in April who performed a 24-hour  Holter.  She has frequent PVCs, but only one 3-beat run of nonsustained  V-tach.  Barbara Marks estimated to be 30%.   She has severe depression and has required electrotherapy in the past.  I do not think she tolerated beta-blocker.   She is getting ready to go to [facility] for 2-week break.  She moved  here from there.  She is excited.   She would like to change blood pressure medicines and get rid of the  diuretic component of her lisinopril/hydrochlorothiazide.  I asked her  to talk to Barbara Marks about this when she returned from her trip.  Changing prior to her trip is not a good idea.   PHYSICAL EXAMINATION:  VITAL SIGNS:  Her blood pressure today is 154/90,  her pulse 67 and regular, weight is 177, down 7.  HEENT:  Normal.  NECK:  Carotid upstrokes were equal and bilateral without bruits.  No  JVD.  Thyroid is not enlarged.  Trachea is midline.  LUNGS:  Clear.  HEART:  Reveals regular rate and rhythm.  No gallop.  ABDOMEN:  Soft.  EXTREMITIES:  No edema.  Pulses are intact.  She has some varicose  veins.  No sign of DVT.   Her EKG today shows normal sinus rhythm, no ectopy, and left anterior  fascicular block.   I have made no changes in Barbara Marks's plan.  She will talk to Barbara Marks  when she returns about blood pressure control.  We will see her back in  a year.     Thomas C. Daleen Squibb, MD, [facility]  Electronically Signed    TCW/MedQ  DD: 06/18/2008  DT: 06/19/2008  Job #: [id number]

## 2011-02-20 NOTE — Progress Notes (Signed)
[facility] OFFICE NOTE   [name], [name]                        MRN:          [id number]  DATE:01/22/2008                            DOB:          [birth year]    [name] is seen today in followup for previously implanted pacemaker  with known poor lead function.  She comes in complaining mostly of  fatigue.  She has a longstanding history of depression attributed to  chemical imbalance.  She is now wondering whether it is related to  psychological imbalance.  She also notes that her PVCs have been more  frequent.  Intercurrent Myoview scanning at the [facility] office was  normal.  Electrocardiogram today demonstrated bigeminy.   Her medications are notable for her captopril having been discontinued,  and she was initiated on lisinopril.  She is also on multiple vitamins.   PHYSICAL EXAMINATION TODAY:  VITAL SIGNS:  Her blood pressure is 150/79,  her pulse was 64, occasionally paced, occasionally sinus, and then  occasionally bigeminal.  LUNGS:  Clear.  HEART SOUNDS:  Regular.  EXTREMITIES:  Without edema.  GENERAL:  She was in no acute distress.   Interrogation of her pacemaker demonstrated stable pacemaker function.  I was trying to assess from the histograms, and the second peak of beats  that occur at faster than 110 beats per minute, the frequency of her  ventricular ectopy, but I was not able to do that.   IMPRESSION:  1. Previously implanted pacemaker, with issues as previously outlined.  2. Ventricular ectopy - right ventricular outflow tract, with      patient's impression that this is increasingly frequent.  3. Depression, with chronic electroconvulsive therapy.   [name] is doing pretty well.  I wonder to what degree her PVCs are  contributing.  We will plan to get a Holter monitor to measure the  frequency of these beats.   We will see her again in 4-6 weeks' time to review  that.     Duke Salvia, MD, [facility]  Electronically Signed    SCK/MedQ  DD: 01/17/2008  DT: 01/17/2008  Job #: [id number]

## 2011-03-09 ENCOUNTER — Ambulatory Visit: Payer: No Typology Code available for payment source | Admitting: Urology

## 2011-03-27 ENCOUNTER — Ambulatory Visit: Payer: No Typology Code available for payment source | Admitting: Unknown Physician Specialty

## 2011-04-05 ENCOUNTER — Ambulatory Visit: Payer: No Typology Code available for payment source | Admitting: Unknown Physician Specialty

## 2011-04-13 ENCOUNTER — Ambulatory Visit: Payer: No Typology Code available for payment source | Admitting: Obstetrics and Gynecology

## 2011-04-15 ENCOUNTER — Encounter: Payer: Self-pay | Admitting: Internal Medicine

## 2011-05-06 ENCOUNTER — Ambulatory Visit: Payer: No Typology Code available for payment source | Admitting: Unknown Physician Specialty

## 2011-05-20 ENCOUNTER — Encounter: Payer: Self-pay | Admitting: Internal Medicine

## 2011-05-20 ENCOUNTER — Ambulatory Visit (INDEPENDENT_AMBULATORY_CARE_PROVIDER_SITE_OTHER): Payer: Medicare Other | Admitting: Internal Medicine

## 2011-05-20 VITALS — BP 124/78 | HR 92 | Ht 65.0 in | Wt 155.0 lb

## 2011-05-20 DIAGNOSIS — I10 Essential (primary) hypertension: Secondary | ICD-10-CM

## 2011-05-20 DIAGNOSIS — Z950 Presence of cardiac pacemaker: Secondary | ICD-10-CM

## 2011-05-20 DIAGNOSIS — I4949 Other premature depolarization: Secondary | ICD-10-CM

## 2011-05-20 DIAGNOSIS — I498 Other specified cardiac arrhythmias: Secondary | ICD-10-CM

## 2011-05-20 DIAGNOSIS — I495 Sick sinus syndrome: Secondary | ICD-10-CM

## 2011-05-20 LAB — PACEMAKER DEVICE OBSERVATION
AL IMPEDENCE PM: 334 Ohm
ATRIAL PACING PM: 3
BATTERY VOLTAGE: 2.79 V
RV LEAD IMPEDENCE PM: 466 Ohm
VENTRICULAR PACING PM: 8

## 2011-05-20 MED ORDER — ASPIRIN 81 MG PO TABS: 81.0000 mg | ORAL_TABLET | Freq: Every day | ORAL | Status: DC

## 2011-05-20 NOTE — Progress Notes (Signed)
  HPI  Barbara Marks is a 77 y.o. female Seen in followup for fatigue and apparent bradycardia. She is status post pacemaker implantation wit h generator replacement May 2011  She has known PVCs giving rise to bigeminy; at her last office visit we initiated propafenone Has history cardiac cath in 2004 with nonobstructive CAD and  profound significant long history of depression and anxiety requiring ECT treatment dating back to the 10s   She continues to have ECT and has continued to struggled since the death of her husband. She tells the tales today of the exploits of her granddaughters    Past Medical History  Diagnosis Date  . Depression   . Premature ventricular contraction   . Hyperlipidemia, mixed   . Bradycardia   . Unspecified essential hypertension   . Pacemaker     Past Surgical History  Procedure Date  . Appendectomy 1952  . Tumor excision     And nemamgeomas  . Cholecystectomy 1983  . Pacemaker insertion     Medtronic    Current Outpatient Prescriptions  Medication Sig Dispense Refill  . amLODipine (NORVASC) 5 MG tablet Take 5 mg by mouth daily.        [name] aspirin 81 MG tablet Take 162 mg by mouth daily.        . cholecalciferol (VITAMIN D) 1000 UNITS tablet Take 2,000 Units by mouth daily.        . Coenzyme Q10 (COQ10) 150 MG CAPS Take 1 capsule by mouth daily.        [name] FLUoxetine (PROZAC) 10 MG tablet Take 10 mg by mouth daily.        . Glucosamine-Chondroit-Vit C-Mn (GLUCOSAMINE-CHONDROITIN) CAPS Take by mouth daily.        . Multiple Vitamin (MULTIVITAMIN) tablet Take 1 tablet by mouth daily.        [name] OLANZapine (ZYPREXA) 5 MG tablet Take 5 mg by mouth at bedtime.        . potassium chloride SA (K-DUR,KLOR-CON) 20 MEQ tablet Take 20 mEq by mouth daily.        . propafenone (RYTHMOL) 225 MG tablet Take 225 mg by mouth 2 (two) times daily.        . Vitamin B Complex-C CAPS Take 1 capsule by mouth daily.          Allergies  Allergen Reactions  . Codeine     . Simvastatin     Review of Systems negative except from HPI and PMH  Physical Exam Well developed and well nourished in no acute distress HENT normal E scleral and icterus clear Neck Supple JVP flat; carotids brisk and full Clear to ausculation Regular rate and rhythm, no murmurs gallops or rub Soft with active bowel sounds No clubbing cyanosis and edema Alert and oriented, grossly normal motor and sensory function Skin Warm and Dry   Assessment and  Plan

## 2011-05-20 NOTE — Patient Instructions (Signed)
You are doing well. Your physician has recommended you make the following change in your medication: DECREASE Aspirin to 81mg  daily.    Please call us if you have new issues that need to be addressed before your next appt.   Please schedule appt to see Dr. Mariah Milling in Sept. 2012:  We will call you for an appt with [name] in 6 months

## 2011-05-20 NOTE — Assessment & Plan Note (Signed)
functional

## 2011-05-20 NOTE — Assessment & Plan Note (Signed)
Her pvc burden is decreased by about 50% from 3.31M/2month to 2.34M/12month now at about 12%  This remains an inssue  She does not think she is much better but will continue propafenone and see if further decrease in PVC are noted

## 2011-05-20 NOTE — Assessment & Plan Note (Signed)
Elevated.  May need to increase amlodipine at next visit

## 2011-05-20 NOTE — Assessment & Plan Note (Signed)
The patient's device was interrogated.  The information was reviewed. No changes were made in the programming.    

## 2011-06-06 ENCOUNTER — Ambulatory Visit: Payer: No Typology Code available for payment source | Admitting: Unknown Physician Specialty

## 2011-06-09 ENCOUNTER — Encounter: Payer: Self-pay | Admitting: Cardiovascular Disease

## 2011-06-10 ENCOUNTER — Ambulatory Visit (INDEPENDENT_AMBULATORY_CARE_PROVIDER_SITE_OTHER): Payer: Medicare Other | Admitting: Cardiovascular Disease

## 2011-06-10 ENCOUNTER — Encounter: Payer: Self-pay | Admitting: Cardiovascular Disease

## 2011-06-10 DIAGNOSIS — F329 Major depressive disorder, single episode, unspecified: Secondary | ICD-10-CM

## 2011-06-10 DIAGNOSIS — E785 Hyperlipidemia, unspecified: Secondary | ICD-10-CM

## 2011-06-10 DIAGNOSIS — I2510 Atherosclerotic heart disease of native coronary artery without angina pectoris: Secondary | ICD-10-CM

## 2011-06-10 DIAGNOSIS — I4949 Other premature depolarization: Secondary | ICD-10-CM

## 2011-06-10 DIAGNOSIS — Z950 Presence of cardiac pacemaker: Secondary | ICD-10-CM

## 2011-06-10 DIAGNOSIS — I498 Other specified cardiac arrhythmias: Secondary | ICD-10-CM

## 2011-06-10 DIAGNOSIS — I10 Essential (primary) hypertension: Secondary | ICD-10-CM

## 2011-06-10 NOTE — Assessment & Plan Note (Signed)
PVCs are relatively well controlled on Rythmol b.i.d..

## 2011-06-10 NOTE — Assessment & Plan Note (Signed)
Followed by [name]. Placed for bradycardia.

## 2011-06-10 NOTE — Assessment & Plan Note (Signed)
She is not interested in taking a cholesterol medication though will consider red yeast rice.

## 2011-06-10 NOTE — Progress Notes (Signed)
Patient ID: Barbara Marks, female    DOB: [birth year], 77 y.o.   MRN: [id number]  HPI Comments: Barbara Marks is a 77 year old woman,  history of CAD by cardiac cath in 2004 with nonobstructive CAD, pacemaker placed for bradycardia, chronic PVCs, profound significant long history of depression and anxiety requiring ECT treatment dating back to the 80s, history of kidney stones with lithotripsy, who presents for routine followup.  She was previously referred to [name] for her symptomatic PVCs. She was started on Rythmol b.i.d. And has had improvement of her symptoms. Currently she denies that this is a significant problem. She has not had any side effects on the Rythmol. She does have a tingling in her feet, typically with movement. He feel like they are asleep.  She continues to receive ECT treatment every 4 weeks. She believes that this has caused some acute short-term memory loss though she feels better with it. No significant chest discomfort.   Normal sinus rhythm with rate 70 beats per minute, nonspecific T wave abnormality, poor R-wave progression through the anterior precordial leads.       Outpatient Encounter Prescriptions as of 06/10/2011  Medication Sig Dispense Refill  . amLODipine (NORVASC) 5 MG tablet Take 5 mg by mouth daily.        [name] aspirin 81 MG tablet Take 1 tablet (81 mg total) by mouth daily.  30 tablet  0  . cholecalciferol (VITAMIN D) 1000 UNITS tablet Take 2,000 Units by mouth daily.        . Coenzyme Q10 (COQ10) 150 MG CAPS Take 1 capsule by mouth daily.        [name] FLUoxetine (PROZAC) 10 MG tablet Take 10 mg by mouth daily.        . Glucosamine-Chondroit-Vit C-Mn (GLUCOSAMINE-CHONDROITIN) CAPS Take by mouth daily.        . Multiple Vitamin (MULTIVITAMIN) tablet Take 1 tablet by mouth daily.        [name] OLANZapine (ZYPREXA) 5 MG tablet Take 5 mg by mouth at bedtime.        . potassium chloride (KLOR-CON) 10 MEQ CR tablet Take 10 mEq by mouth daily.        . propafenone  (RYTHMOL) 225 MG tablet Take 225 mg by mouth 2 (two) times daily.        . Vitamin B Complex-C CAPS Take 1 capsule by mouth daily.        [name] DISCONTD: potassium chloride SA (K-DUR,KLOR-CON) 20 MEQ tablet Take 20 mEq by mouth daily.           Review of Systems  Constitutional: Negative.   HENT: Negative.   Eyes: Negative.   Respiratory: Negative.   Cardiovascular: Negative.   Gastrointestinal: Negative.   Musculoskeletal: Negative.   Skin: Negative.   Neurological: Negative.   Hematological: Negative.   Psychiatric/Behavioral: Positive for dysphoric mood.  All other systems reviewed and are negative.    BP 153/90  Pulse 79  Ht 5\' 5"  (1.651 m)  Wt 151 lb (68.493 kg)  BMI 25.13 kg/m2  Physical Exam  Nursing note and vitals reviewed. Constitutional: She is oriented to person, place, and time. She appears well-developed and well-nourished.  HENT:  Head: Normocephalic.  Nose: Nose normal.  Mouth/Throat: Oropharynx is clear and moist.  Eyes: Conjunctivae are normal. Pupils are equal, round, and reactive to light.  Neck: Normal range of motion. Neck supple. No JVD present.  Cardiovascular: Normal rate, regular rhythm, S1 normal, S2 normal, normal heart sounds  and intact distal pulses.  Exam reveals no gallop and no friction rub.   No murmur heard. Pulmonary/Chest: Effort normal and breath sounds normal. No respiratory distress. She has no wheezes. She has no rales. She exhibits no tenderness.  Abdominal: Soft. Bowel sounds are normal. She exhibits no distension. There is no tenderness.  Musculoskeletal: Normal range of motion. She exhibits no edema and no tenderness.  Lymphadenopathy:    She has no cervical adenopathy.  Neurological: She is alert and oriented to person, place, and time. Coordination normal.  Skin: Skin is warm and dry. No rash noted. No erythema.  Psychiatric: She has a normal mood and affect. Her behavior is normal. Judgment and thought content normal.           Assessment and Plan

## 2011-06-10 NOTE — Assessment & Plan Note (Signed)
Currently receives ECT treatment once a month.

## 2011-06-10 NOTE — Assessment & Plan Note (Signed)
I have asked her to closely monitor her blood pressure. This is elevated today.

## 2011-06-10 NOTE — Assessment & Plan Note (Signed)
Currently with no symptoms of angina. No further workup at this time. Continue current medication regimen. 

## 2011-06-10 NOTE — Patient Instructions (Signed)
You are doing well. No medication changes were made. Please call us if you have new issues that need to be addressed before your next appt.  We will call you for a follow up Appt. In 12 months  

## 2011-07-06 ENCOUNTER — Ambulatory Visit: Payer: No Typology Code available for payment source | Admitting: Unknown Physician Specialty

## 2011-08-06 ENCOUNTER — Ambulatory Visit: Payer: No Typology Code available for payment source | Admitting: Unknown Physician Specialty

## 2011-09-07 ENCOUNTER — Ambulatory Visit: Payer: No Typology Code available for payment source | Admitting: Unknown Physician Specialty

## 2011-09-19 ENCOUNTER — Other Ambulatory Visit: Payer: Self-pay | Admitting: Internal Medicine

## 2011-10-07 ENCOUNTER — Ambulatory Visit: Payer: Self-pay | Admitting: Unknown Physician Specialty

## 2011-11-12 ENCOUNTER — Ambulatory Visit: Payer: Self-pay | Admitting: Obstetrics and Gynecology

## 2011-11-20 ENCOUNTER — Ambulatory Visit: Payer: Self-pay | Admitting: Unknown Physician Specialty

## 2011-12-03 ENCOUNTER — Telehealth: Payer: Self-pay | Admitting: Cardiovascular Disease

## 2011-12-03 NOTE — Telephone Encounter (Signed)
Patient called stating Dr. Alycia Rossetti wants her to start taking Ritalin, but told her to check with her cardiologist 1st to make sure this is ok.  Please advise?

## 2011-12-04 ENCOUNTER — Ambulatory Visit: Payer: Self-pay | Admitting: Unknown Physician Specialty

## 2011-12-04 NOTE — Telephone Encounter (Signed)
Left a message to call back.

## 2011-12-04 NOTE — Telephone Encounter (Signed)
Ritalin may exacerbate  Her PVCs She has been on rhythmol for PVCs. It would be up to her. No severe CAD/angina by hx If PVCs sx get worse, she could stop ritalin

## 2011-12-07 NOTE — Telephone Encounter (Signed)
Fu call °Patient returning your call °

## 2011-12-07 NOTE — Telephone Encounter (Signed)
Left a message to call back.

## 2011-12-07 NOTE — Telephone Encounter (Signed)
Patient aware  That  Ritalin can exacerbate her PVC's, and if she  decides to  taken this medication and PVC's get worse, pt is  to stop taken this medication. Patient verbalized understanding.

## 2011-12-17 ENCOUNTER — Encounter: Payer: Self-pay | Admitting: Internal Medicine

## 2011-12-17 ENCOUNTER — Ambulatory Visit (INDEPENDENT_AMBULATORY_CARE_PROVIDER_SITE_OTHER): Payer: Medicare Other | Admitting: *Deleted

## 2011-12-17 DIAGNOSIS — I498 Other specified cardiac arrhythmias: Secondary | ICD-10-CM

## 2011-12-17 LAB — PACEMAKER DEVICE OBSERVATION
ATRIAL PACING PM: 4
BATTERY VOLTAGE: 2.79 V
RV LEAD AMPLITUDE: 2 mv
VENTRICULAR PACING PM: 8

## 2011-12-17 NOTE — Progress Notes (Signed)
PPM check 

## 2012-01-04 ENCOUNTER — Ambulatory Visit: Payer: Self-pay | Admitting: Unknown Physician Specialty

## 2012-02-03 ENCOUNTER — Ambulatory Visit: Payer: Self-pay | Admitting: Unknown Physician Specialty

## 2012-03-05 ENCOUNTER — Ambulatory Visit: Payer: Self-pay | Admitting: Unknown Physician Specialty

## 2012-04-04 ENCOUNTER — Ambulatory Visit: Payer: Self-pay | Admitting: Unknown Physician Specialty

## 2012-05-05 ENCOUNTER — Ambulatory Visit: Payer: Self-pay | Admitting: Unknown Physician Specialty

## 2012-06-03 ENCOUNTER — Ambulatory Visit: Payer: Self-pay | Admitting: Urology

## 2012-06-07 ENCOUNTER — Encounter: Payer: Self-pay | Admitting: *Deleted

## 2012-06-08 ENCOUNTER — Ambulatory Visit: Payer: Self-pay | Admitting: Unknown Physician Specialty

## 2012-06-09 ENCOUNTER — Encounter: Payer: Self-pay | Admitting: Cardiovascular Disease

## 2012-06-09 ENCOUNTER — Ambulatory Visit (INDEPENDENT_AMBULATORY_CARE_PROVIDER_SITE_OTHER): Payer: Medicare Other | Admitting: Cardiovascular Disease

## 2012-06-09 VITALS — BP 148/90 | HR 73 | Ht 66.0 in | Wt 156.0 lb

## 2012-06-09 DIAGNOSIS — Z950 Presence of cardiac pacemaker: Secondary | ICD-10-CM

## 2012-06-09 DIAGNOSIS — E785 Hyperlipidemia, unspecified: Secondary | ICD-10-CM

## 2012-06-09 DIAGNOSIS — I2510 Atherosclerotic heart disease of native coronary artery without angina pectoris: Secondary | ICD-10-CM

## 2012-06-09 DIAGNOSIS — I4949 Other premature depolarization: Secondary | ICD-10-CM

## 2012-06-09 DIAGNOSIS — I447 Left bundle-branch block, unspecified: Secondary | ICD-10-CM | POA: Insufficient documentation

## 2012-06-09 NOTE — Assessment & Plan Note (Signed)
She continues to have PVCs. Improved with Rythmol.

## 2012-06-09 NOTE — Assessment & Plan Note (Signed)
Followed by Dr. Klein 

## 2012-06-09 NOTE — Assessment & Plan Note (Signed)
Currently with no symptoms of angina. No further workup at this time. Continue current medication regimen. 

## 2012-06-09 NOTE — Progress Notes (Signed)
Patient ID: Barbara Marks, female    DOB: [birth year], 78 y.o.   MRN: [id number]  HPI Comments: Barbara Marks is a 78 year old woman,  history of CAD by cardiac cath in 2004 with nonobstructive CAD, pacemaker placed for bradycardia, chronic PVCs, profound significant long history of depression and anxiety requiring ECT treatment dating back to the 80s, history of kidney stones with lithotripsy, who presents for routine followup.  She was previously referred to [name] for her symptomatic PVCs. started on Rythmol b.i.d.,  improvement of her symptoms.  Currently she denies that this is a significant problem. She has not had any side effects on the Rythmol.  She continues to have ECT treatment every 4-5 weeks. She does not feel that she has any benefit from these treatments, as she did in the past. She is followed by Dr. Alycia Rossetti. She does have some right lower quadrant discomfort and was told by Dr. Achilles Dunk that she had nerve compression from her back.  No significant chest discomfort or shortness of breath.  She reports that she continues to take red yeast rice   Normal sinus rhythm with rate 73 beats per minute, left bundle branch block (prior EKG showed incomplete bundle)  Outpatient Encounter Prescriptions as of 06/09/2012  Medication Sig Dispense Refill  . amLODipine (NORVASC) 5 MG tablet Take 5 mg by mouth daily.        [name] aspirin 81 MG tablet Take 1 tablet (81 mg total) by mouth daily.  30 tablet  0  . cholecalciferol (VITAMIN D) 1000 UNITS tablet Take 2,000 Units by mouth daily.        . Coenzyme Q10 (COQ10) 150 MG CAPS Take 1 capsule by mouth daily.        [name] FLUoxetine (PROZAC) 10 MG tablet Take 20 mg by mouth daily.       . Glucosamine-Chondroit-Vit C-Mn (GLUCOSAMINE-CHONDROITIN) CAPS Take by mouth daily.        . Multiple Vitamin (MULTIVITAMIN) tablet Take 1 tablet by mouth daily.        [name] OLANZapine (ZYPREXA) 5 MG tablet Take 5 mg by mouth at bedtime.        . potassium chloride (KLOR-CON) 10  MEQ CR tablet Take 10 mEq by mouth daily.        . propafenone (RYTHMOL) 225 MG tablet       . Vitamin B Complex-C CAPS Take 1 capsule by mouth daily.            Review of Systems  Constitutional: Negative.   HENT: Negative.   Eyes: Negative.   Respiratory: Negative.   Cardiovascular: Negative.   Gastrointestinal: Negative.        Right lower quadrant discomfort  Musculoskeletal: Negative.   Skin: Negative.   Neurological: Negative.   Hematological: Negative.   Psychiatric/Behavioral: Positive for dysphoric mood.  All other systems reviewed and are negative.    BP 148/90  Ht 5\' 6"  (1.676 m)  Wt 156 lb (70.761 kg)  BMI 25.18 kg/m2  Physical Exam  Nursing note and vitals reviewed. Constitutional: She is oriented to person, place, and time. She appears well-developed and well-nourished.  HENT:  Head: Normocephalic.  Nose: Nose normal.  Mouth/Throat: Oropharynx is clear and moist.  Eyes: Conjunctivae are normal. Pupils are equal, round, and reactive to light.  Neck: Normal range of motion. Neck supple. No JVD present.  Cardiovascular: Normal rate, regular rhythm, S1 normal, S2 normal, normal heart sounds and intact distal pulses.  Exam reveals no  gallop and no friction rub.   No murmur heard. Pulmonary/Chest: Effort normal and breath sounds normal. No respiratory distress. She has no wheezes. She has no rales. She exhibits no tenderness.  Abdominal: Soft. Bowel sounds are normal. She exhibits no distension. There is no tenderness.  Musculoskeletal: Normal range of motion. She exhibits no edema and no tenderness.  Lymphadenopathy:    She has no cervical adenopathy.  Neurological: She is alert and oriented to person, place, and time. Coordination normal.  Skin: Skin is warm and dry. No rash noted. No erythema.  Psychiatric: She has a normal mood and affect. Her behavior is normal. Judgment and thought content normal.         Assessment and Plan

## 2012-06-09 NOTE — Assessment & Plan Note (Signed)
New EKG finding, prior EKG in 2012 showing incomplete left bundle. She is asymptomatic. No further workup at this time.

## 2012-06-09 NOTE — Patient Instructions (Addendum)
You are doing well. No medication changes were made.  Please call us if you have new issues that need to be addressed before your next appt.  Your physician wants you to follow-up in: 12 months.  You will receive a reminder letter in the mail two months in advance. If you don't receive a letter, please call our office to schedule the follow-up appointment. 

## 2012-06-09 NOTE — Assessment & Plan Note (Signed)
Cholesterol was discussed with her. She takes red yeast rice. She had side effects on Lipitor in the past. Uncertain if she tried other statins. She will have routine blood work with Dr. Terance Hart.

## 2012-06-16 ENCOUNTER — Ambulatory Visit (INDEPENDENT_AMBULATORY_CARE_PROVIDER_SITE_OTHER): Payer: Medicare Other | Admitting: Internal Medicine

## 2012-06-16 ENCOUNTER — Encounter: Payer: Self-pay | Admitting: Internal Medicine

## 2012-06-16 VITALS — BP 114/72 | HR 64 | Ht 66.0 in | Wt 154.8 lb

## 2012-06-16 DIAGNOSIS — I498 Other specified cardiac arrhythmias: Secondary | ICD-10-CM

## 2012-06-16 DIAGNOSIS — I4949 Other premature depolarization: Secondary | ICD-10-CM

## 2012-06-16 DIAGNOSIS — Z950 Presence of cardiac pacemaker: Secondary | ICD-10-CM

## 2012-06-16 LAB — PACEMAKER DEVICE OBSERVATION
AL THRESHOLD: 1 V
ATRIAL PACING PM: 3
RV LEAD AMPLITUDE: 8 mv
RV LEAD THRESHOLD: 0.75 V

## 2012-06-16 NOTE — Progress Notes (Signed)
Patient Care Team: [name] as PCP - General (Family Medicine)   HPI  Barbara Marks is a 78 y.o. female Seen in followup for fatigue and apparent bradycardia. She is status post pacemaker implantation wit h generator replacement May 2011  She has known PVCs giving rise to bigeminy; at her last office visit we initiated propafenone and according to her last visit with Dr. Knute Neu, she has felt considerably better. On talking to her today she is not entirely convinced. Her PVC burden remains relatively diminished however thought about 12%   Has history cardiac cath in 2004 with nonobstructive CAD and profound significant long history of depression and anxiety requiring ECT treatment dating back to the 104s  She continues to have ECT and has continued to struggled since the death of her husband. She tells the tales today of the exploits of her granddaughters   Past Medical History  Diagnosis Date  . Depression   . Premature ventricular contraction   . Hyperlipidemia, mixed   . Bradycardia   . Unspecified essential hypertension   . Pacemaker   . History of kidney stones     Past Surgical History  Procedure Date  . Appendectomy 1952  . Tumor excision     And nemamgeomas  . Cholecystectomy 1983  . Pacemaker insertion     [employer]    Current Outpatient Prescriptions  Medication Sig Dispense Refill  . amLODipine (NORVASC) 5 MG tablet Take 5 mg by mouth daily.        [name] aspirin 81 MG tablet Take 1 tablet (81 mg total) by mouth daily.  30 tablet  0  . cholecalciferol (VITAMIN D) 1000 UNITS tablet Take 2,000 Units by mouth daily.        . Coenzyme Q10 (COQ10) 150 MG CAPS Take 1 capsule by mouth daily.        . Glucosamine-Chondroit-Vit C-Mn (GLUCOSAMINE-CHONDROITIN) CAPS Take by mouth daily.        . Multiple Vitamin (MULTIVITAMIN) tablet Take 1 tablet by mouth daily.        . potassium chloride (KLOR-CON) 10 MEQ CR tablet Take 10 mEq by mouth daily.        . propafenone (RYTHMOL)  225 MG tablet Take 225 mg by mouth 2 (two) times daily.       . Vitamin B Complex-C CAPS Take 1 capsule by mouth daily.          Allergies  Allergen Reactions  . Codeine   . Simvastatin     Review of Systems negative except from HPI and PMH  Physical Exam BP 114/72  Pulse 64  Ht 5\' 6"  (1.676 m)  Wt 154 lb 12 oz (70.194 kg)  BMI 24.98 kg/m2 Well developed and well nourished in no acute distress HENT normal E scleral and icterus clear Neck Supple JVP flat; carotids brisk and full Clear to ausculation  Regular rate and rhythm, early systolic murmur along the right sternal border Soft with active bowel sounds No clubbing cyanosis {no Edema Alert and oriented, grossly normal motor and sensory function; affect flat Skin Warm and Dry    Assessment and  Plan

## 2012-06-16 NOTE — Assessment & Plan Note (Signed)
The patient's device was interrogated.  The information was reviewed. No changes were made in the programming.    

## 2012-06-16 NOTE — Patient Instructions (Addendum)
Your physician wants you to follow-up in: 1 year with Dr Klein.  You will receive a reminder letter in the mail two months in advance. If you don't receive a letter, please call our office to schedule the follow-up appointment.  

## 2012-06-16 NOTE — Assessment & Plan Note (Signed)
Is not clearly that she is better. Is also not clear how well she revealed to discern that she is better. I am inclined however the next visit if she cannot appreciate that she is better that is probably not worth the risk of antiarrhythmic drug and I would stop

## 2012-07-05 ENCOUNTER — Ambulatory Visit: Payer: Self-pay | Admitting: Unknown Physician Specialty

## 2012-07-18 ENCOUNTER — Ambulatory Visit: Payer: Self-pay | Admitting: Family Medicine

## 2012-08-05 ENCOUNTER — Ambulatory Visit: Payer: Self-pay | Admitting: Psychiatry

## 2012-09-04 ENCOUNTER — Ambulatory Visit: Payer: Self-pay | Admitting: Psychiatry

## 2012-10-05 ENCOUNTER — Ambulatory Visit: Payer: Self-pay | Admitting: Psychiatry

## 2012-10-11 ENCOUNTER — Ambulatory Visit: Payer: Medicare Other | Admitting: Cardiovascular Disease

## 2012-10-21 ENCOUNTER — Telehealth: Payer: Self-pay

## 2012-10-21 ENCOUNTER — Emergency Department: Payer: Self-pay | Admitting: Emergency Medicine

## 2012-10-21 LAB — COMPREHENSIVE METABOLIC PANEL
Albumin: 3.7 g/dL (ref 3.4–5.0)
BUN: 14 mg/dL (ref 7–18)
Bilirubin,Total: 0.5 mg/dL (ref 0.2–1.0)
Calcium, Total: 9.1 mg/dL (ref 8.5–10.1)
Chloride: 104 mmol/L (ref 98–107)
Co2: 29 mmol/L (ref 21–32)
EGFR (African American): >60
EGFR (Non-African Amer.): >60
Potassium: 3.6 mmol/L (ref 3.5–5.1)
SGOT(AST): 20 U/L (ref 15–37)
SGPT (ALT): 18 U/L (ref 12–78)
Total Protein: 7.1 g/dL (ref 6.4–8.2)

## 2012-10-21 LAB — CK TOTAL AND CKMB (NOT AT ARMC)
CK, Total: 45 U/L (ref 21–215)
CK-MB: <0.5 ng/mL — ABNORMAL LOW (ref 0.5–3.6)

## 2012-10-21 LAB — CBC
HCT: 37.7 % (ref 35.0–47.0)
HGB: 12.4 g/dL (ref 12.0–16.0)
MCV: 93 fL (ref 80–100)
Platelet: 198 10*3/uL (ref 150–440)
WBC: 5.1 10*3/uL (ref 3.6–11.0)

## 2012-10-21 LAB — TROPONIN I: Troponin-I: <0.02 ng/mL

## 2012-10-21 NOTE — Telephone Encounter (Signed)
Patient walked into the office with active chest pain and left arm numbness. She states had symptoms for two days and would like to be checked out. I told the patient we do not have a doctor present in the office at this times nor an RN and we would have to call the EMS for her unless she has someone here to drive her to the hospital. The patient states does not have anybody here with her and will contact a friend to pick her up and take her to the ER at [facility].

## 2012-11-07 ENCOUNTER — Ambulatory Visit: Payer: Self-pay | Admitting: Psychiatry

## 2012-12-04 ENCOUNTER — Ambulatory Visit: Payer: Self-pay | Admitting: Psychiatry

## 2012-12-16 ENCOUNTER — Other Ambulatory Visit: Payer: Self-pay | Admitting: *Deleted

## 2012-12-16 MED ORDER — PROPAFENONE HCL 225 MG PO TABS: 225.0000 mg | ORAL_TABLET | Freq: Two times a day (BID) | ORAL | Status: DC

## 2013-01-04 ENCOUNTER — Ambulatory Visit: Payer: Self-pay | Admitting: Psychiatry

## 2013-01-10 ENCOUNTER — Other Ambulatory Visit: Payer: Self-pay

## 2013-01-10 ENCOUNTER — Ambulatory Visit (INDEPENDENT_AMBULATORY_CARE_PROVIDER_SITE_OTHER): Payer: Medicare Other | Admitting: *Deleted

## 2013-01-10 DIAGNOSIS — I498 Other specified cardiac arrhythmias: Secondary | ICD-10-CM

## 2013-01-10 LAB — PACEMAKER DEVICE OBSERVATION
AL AMPLITUDE: 2.8 mv
RV LEAD AMPLITUDE: 8 mv
RV LEAD IMPEDENCE PM: 548 Ohm
RV LEAD THRESHOLD: 1 V
VENTRICULAR PACING PM: 0

## 2013-01-10 NOTE — Progress Notes (Signed)
PPM check 

## 2013-02-02 ENCOUNTER — Ambulatory Visit: Payer: Self-pay | Admitting: Psychiatry

## 2013-02-03 ENCOUNTER — Encounter: Payer: Self-pay | Admitting: Internal Medicine

## 2013-03-05 ENCOUNTER — Ambulatory Visit: Payer: Self-pay | Admitting: Psychiatry

## 2013-04-04 ENCOUNTER — Ambulatory Visit: Payer: Self-pay | Admitting: Psychiatry

## 2013-05-10 ENCOUNTER — Ambulatory Visit: Payer: Self-pay | Admitting: Psychiatry

## 2013-05-26 ENCOUNTER — Ambulatory Visit: Payer: Self-pay | Admitting: Gastroenterology

## 2013-06-05 ENCOUNTER — Ambulatory Visit: Payer: Self-pay | Admitting: Psychiatry

## 2013-06-09 ENCOUNTER — Ambulatory Visit (INDEPENDENT_AMBULATORY_CARE_PROVIDER_SITE_OTHER): Payer: Medicare Other | Admitting: Cardiovascular Disease

## 2013-06-09 ENCOUNTER — Encounter: Payer: Self-pay | Admitting: Cardiovascular Disease

## 2013-06-09 VITALS — BP 110/60 | HR 83 | Ht 66.0 in | Wt 149.0 lb

## 2013-06-09 DIAGNOSIS — I4949 Other premature depolarization: Secondary | ICD-10-CM

## 2013-06-09 DIAGNOSIS — F329 Major depressive disorder, single episode, unspecified: Secondary | ICD-10-CM

## 2013-06-09 DIAGNOSIS — I2510 Atherosclerotic heart disease of native coronary artery without angina pectoris: Secondary | ICD-10-CM

## 2013-06-09 DIAGNOSIS — Z950 Presence of cardiac pacemaker: Secondary | ICD-10-CM

## 2013-06-09 NOTE — Patient Instructions (Addendum)
You are doing well. No medication changes were made.  Please call us if you have new issues that need to be addressed before your next appt.  Your physician wants you to follow-up in: 6 months.  You will receive a reminder letter in the mail two months in advance. If you don't receive a letter, please call our office to schedule the follow-up appointment.   

## 2013-06-09 NOTE — Assessment & Plan Note (Signed)
Long history of ECT treatment in the past

## 2013-06-09 NOTE — Assessment & Plan Note (Signed)
Followed by Dr. Klein 

## 2013-06-09 NOTE — Assessment & Plan Note (Addendum)
Previously evaluated by [name] for PVCs. Currently on Rythmol. PVCs in a bigeminal pattern on today's visit. She is asymptomatic. This was discussed with her. Uncertain if she needs to stay on Rythmol if she is having breakthrough PVCs. Perhaps could be changed to alternate antiarrhythmic, possibly flecainide. Will discuss with [name]. She may require a repeat Holter to determine PVC burden prior to any medication change.

## 2013-06-09 NOTE — Progress Notes (Signed)
 Patient ID: Barbara Marks, female    DOB: 02/28/1934, 79 y.o.   MRN: [id number]  HPI Comments: Barbara Marks is a 79-year-old woman,   cardiac cath in 2004 with nonobstructive CAD, pacemaker placed for bradycardia, chronic PVCs, profound significant long history of depression and anxiety requiring ECT treatment dating back to the 80s, history of kidney stones with lithotripsy, who presents for routine followup.  She reports that overall she is doing well. Recent diagnosis of diverticuli, DJD. She has had chronic abdominal pain. Found to have "polyps on her pancreas". Uncertain if she means a cyst. She has been seeing GI at [facility].  No significant chest discomfort or shortness of breath.  She reports that she continues to take red yeast rice  previously referred to Dr. Klein for her symptomatic PVCs. started on Rythmol b.i.d.,  improvement of her symptoms. PVCs noted today. She is asymptomatic. She has not had any side effects on the Rythmol.   Long history of  ECT treatments every 4-5 weeks.  She was followed by Dr. Ryan.    Normal sinus rhythm with rate 83 beats per minute, normal sinus rhythm with PVCs in a bigeminal pattern  Outpatient Encounter Prescriptions as of 06/09/2013  Medication Sig Dispense Refill  . amLODipine (NORVASC) 5 MG tablet Take 5 mg by mouth daily.        . aspirin 81 MG tablet Take 1 tablet (81 mg total) by mouth daily.  30 tablet  0  . cholecalciferol (VITAMIN D) 1000 UNITS tablet Take 2,000 Units by mouth daily.        . ciprofloxacin (CIPRO) 250 MG tablet Take 250 mg by mouth 2 (two) times daily.       . Coenzyme Q10 (COQ10) 150 MG CAPS Take 1 capsule by mouth daily.        . Glucosamine-Chondroit-Vit C-Mn (GLUCOSAMINE-CHONDROITIN) CAPS Take by mouth daily.        . metroNIDAZOLE (FLAGYL) 500 MG tablet Take 500 mg by mouth 3 (three) times daily.       . Multiple Vitamin (MULTIVITAMIN) tablet Take 1 tablet by mouth daily.        . Multiple Vitamins-Minerals (ICAPS)  CAPS Take by mouth.      . potassium chloride (KLOR-CON) 10 MEQ CR tablet Take 10 mEq by mouth daily.        . propafenone (RYTHMOL) 225 MG tablet Take 1 tablet (225 mg total) by mouth 2 (two) times daily.  60 tablet  3  . Vitamin B Complex-C CAPS Take 1 capsule by mouth daily.         No facility-administered encounter medications on file as of 06/09/2013.      Review of Systems  Constitutional: Negative.   HENT: Negative.   Eyes: Negative.   Respiratory: Negative.   Cardiovascular: Negative.   Gastrointestinal: Negative.        Right lower quadrant discomfort  Musculoskeletal: Negative.   Skin: Negative.   Neurological: Negative.   Psychiatric/Behavioral: Positive for dysphoric mood.  All other systems reviewed and are negative.    BP 110/60  Pulse 83  Ht 5' 6" (1.676 m)  Wt 149 lb (67.586 kg)  BMI 24.06 kg/m2  Physical Exam  Nursing note and vitals reviewed. Constitutional: She is oriented to person, place, and time. She appears well-developed and well-nourished.  HENT:  Head: Normocephalic.  Nose: Nose normal.  Mouth/Throat: Oropharynx is clear and moist.  Eyes: Conjunctivae are normal. Pupils are equal, round, and reactive to   light.  Neck: Normal range of motion. Neck supple. No JVD present.  Cardiovascular: Normal rate, regular rhythm, S1 normal, S2 normal, normal heart sounds and intact distal pulses.  Exam reveals no gallop and no friction rub.   No murmur heard. Pulmonary/Chest: Effort normal and breath sounds normal. No respiratory distress. She has no wheezes. She has no rales. She exhibits no tenderness.  Abdominal: Soft. Bowel sounds are normal. She exhibits no distension. There is no tenderness.  Musculoskeletal: Normal range of motion. She exhibits no edema and no tenderness.  Lymphadenopathy:    She has no cervical adenopathy.  Neurological: She is alert and oriented to person, place, and time. Coordination normal.  Skin: Skin is warm and dry. No rash  noted. No erythema.  Psychiatric: She has a normal mood and affect. Her behavior is normal. Judgment and thought content normal.    Assessment and Plan        

## 2013-06-09 NOTE — Assessment & Plan Note (Signed)
Currently with no symptoms of angina. No further workup at this time. Continue current medication regimen. 

## 2013-06-12 ENCOUNTER — Other Ambulatory Visit: Payer: Self-pay

## 2013-06-12 ENCOUNTER — Telehealth: Payer: Self-pay

## 2013-06-12 DIAGNOSIS — R932 Abnormal findings on diagnostic imaging of liver and biliary tract: Secondary | ICD-10-CM

## 2013-06-12 NOTE — Telephone Encounter (Signed)
EUS scheduled, pt instructed and medications reviewed.  Patient instructions mailed to home.  Patient to call with any questions or concerns.  

## 2013-06-13 ENCOUNTER — Telehealth: Payer: Self-pay | Admitting: Gastroenterology

## 2013-06-13 ENCOUNTER — Encounter (HOSPITAL_COMMUNITY): Payer: Self-pay | Admitting: Pharmacy Technician

## 2013-06-13 ENCOUNTER — Encounter (HOSPITAL_COMMUNITY): Payer: Self-pay | Admitting: *Deleted

## 2013-06-13 NOTE — Telephone Encounter (Signed)
Pt wants to keep appt as scheduled she will call back if she cannot find a driver

## 2013-06-14 ENCOUNTER — Telehealth: Payer: Self-pay | Admitting: Gastroenterology

## 2013-06-14 NOTE — Telephone Encounter (Signed)
Pt questioned if her driver could drop her off and come back and pick her I advised her that I believe it is ok but she needs to double check with the hospital.  Pt agreed.  Also, just a note that the instructions say [facility] but it is [facility].  Pt is aware

## 2013-06-15 ENCOUNTER — Telehealth: Payer: Self-pay | Admitting: *Deleted

## 2013-06-15 NOTE — Telephone Encounter (Signed)
In reviewing the patient's last office note from 9/5, I think Dr. Mariah Milling was going to discuss with [name] if rhythmol needed to be changed to flecainide for breakthrough of her PVC's. Will forward to Dr. Mariah Milling for review.

## 2013-06-15 NOTE — Telephone Encounter (Signed)
Dr. Mariah Milling was going to change her medication and she has not heard from him please advise patient.

## 2013-06-16 ENCOUNTER — Other Ambulatory Visit: Payer: Self-pay

## 2013-06-16 DIAGNOSIS — I10 Essential (primary) hypertension: Secondary | ICD-10-CM

## 2013-06-16 DIAGNOSIS — I498 Other specified cardiac arrhythmias: Secondary | ICD-10-CM

## 2013-06-16 NOTE — Telephone Encounter (Signed)
Spoke w/ pt.  She is willing to wear a holter monitor, but is going "into the hospital for an endoscopy next week". She would like for [place] to giver her a call next week to arrange a time for the holter.

## 2013-06-16 NOTE — Telephone Encounter (Signed)
May be best if she wears a 48 hr holter to count the numbers of PVCs Before we consider changing medications. Also need to keep 48 hour diary to let us know if she has symptoms

## 2013-06-19 DIAGNOSIS — I498 Other specified cardiac arrhythmias: Secondary | ICD-10-CM

## 2013-06-21 ENCOUNTER — Telehealth: Payer: Self-pay

## 2013-06-21 NOTE — Telephone Encounter (Signed)
Spoke with Barbara Marks regarding her holter monitor since she had told [name] with [facility] that she did not feel she needed the holter monitor. She was under the impression that the pacemaker was to take care of the PVC's.  Told the patient per Dr. Mariah Milling she does need to wear the holter monitor because her previous EKG was showing many PVC's and that if she is having this on a regular basis, this could cause her heart function to decrease. Told her that we may need to adjust medications based on the frequency of PVC's and with wearing the holter monitor, this will help [place] to determine how often she is having these PVC's.

## 2013-06-22 ENCOUNTER — Encounter (HOSPITAL_COMMUNITY): Payer: Self-pay

## 2013-06-22 ENCOUNTER — Encounter (HOSPITAL_COMMUNITY): Payer: Self-pay | Admitting: Anesthesiology

## 2013-06-22 ENCOUNTER — Encounter (HOSPITAL_COMMUNITY)
Admission: RE | Disposition: A | Discharge status: Home / Self Care | Payer: Self-pay | Source: Ambulatory Visit | Attending: Gastroenterology

## 2013-06-22 ENCOUNTER — Ambulatory Visit (HOSPITAL_COMMUNITY)
Admission: RE | Admit: 2013-06-22 | Discharge: 2013-06-22 | Disposition: A | Discharge status: Home / Self Care | Payer: Medicare Other | Source: Ambulatory Visit | Attending: Gastroenterology | Admitting: Gastroenterology

## 2013-06-22 ENCOUNTER — Ambulatory Visit (HOSPITAL_COMMUNITY): Payer: Medicare Other | Admitting: Anesthesiology

## 2013-06-22 DIAGNOSIS — Z950 Presence of cardiac pacemaker: Secondary | ICD-10-CM | POA: Insufficient documentation

## 2013-06-22 DIAGNOSIS — I498 Other specified cardiac arrhythmias: Secondary | ICD-10-CM | POA: Insufficient documentation

## 2013-06-22 DIAGNOSIS — R932 Abnormal findings on diagnostic imaging of liver and biliary tract: Secondary | ICD-10-CM

## 2013-06-22 DIAGNOSIS — R933 Abnormal findings on diagnostic imaging of other parts of digestive tract: Secondary | ICD-10-CM

## 2013-06-22 DIAGNOSIS — Z79899 Other long term (current) drug therapy: Secondary | ICD-10-CM | POA: Insufficient documentation

## 2013-06-22 DIAGNOSIS — Z9089 Acquired absence of other organs: Secondary | ICD-10-CM | POA: Insufficient documentation

## 2013-06-22 DIAGNOSIS — I4949 Other premature depolarization: Secondary | ICD-10-CM | POA: Insufficient documentation

## 2013-06-22 DIAGNOSIS — K862 Cyst of pancreas: Secondary | ICD-10-CM | POA: Insufficient documentation

## 2013-06-22 DIAGNOSIS — I2510 Atherosclerotic heart disease of native coronary artery without angina pectoris: Secondary | ICD-10-CM | POA: Insufficient documentation

## 2013-06-22 HISTORY — PX: EUS: SHX5427

## 2013-06-22 HISTORY — DX: Other amnesia: R41.3

## 2013-06-22 SURGERY — UPPER ENDOSCOPIC ULTRASOUND (EUS) LINEAR
Anesthesia: Monitor Anesthesia Care

## 2013-06-22 MED ORDER — LACTATED RINGERS IV SOLN
INTRAVENOUS | Status: DC
  Administered 2013-06-22: 1000 mL via INTRAVENOUS

## 2013-06-22 MED ORDER — GLYCOPYRROLATE 0.2 MG/ML IJ SOLN
INTRAMUSCULAR | Status: DC | PRN
  Administered 2013-06-22: 0.1 mg via INTRAVENOUS

## 2013-06-22 MED ORDER — FENTANYL CITRATE 0.05 MG/ML IJ SOLN
INTRAMUSCULAR | Status: DC | PRN
  Administered 2013-06-22: 50 ug via INTRAVENOUS

## 2013-06-22 MED ORDER — MIDAZOLAM HCL 5 MG/5ML IJ SOLN
INTRAMUSCULAR | Status: DC | PRN
  Administered 2013-06-22: 2 mg via INTRAVENOUS

## 2013-06-22 MED ORDER — SODIUM CHLORIDE 0.9 % IV SOLN: INTRAVENOUS | Status: DC

## 2013-06-22 MED ORDER — PROPOFOL INFUSION 10 MG/ML OPTIME
INTRAVENOUS | Status: DC | PRN
  Administered 2013-06-22: 140 ug/kg/min via INTRAVENOUS

## 2013-06-22 MED ORDER — LIDOCAINE HCL (CARDIAC) 20 MG/ML IV SOLN
INTRAVENOUS | Status: DC | PRN
  Administered 2013-06-22: 40 mg via INTRAVENOUS

## 2013-06-22 MED ORDER — SODIUM CHLORIDE 0.9 % IV SOLN
INTRAVENOUS | Status: DC | PRN
  Administered 2013-06-22: 14:00:00 via INTRAVENOUS

## 2013-06-22 NOTE — Op Note (Signed)
[facility] [address] [place], [address]   ENDOSCOPIC ULTRASOUND PROCEDURE REPORT  PATIENT: Barbara Marks, Barbara Marks  MR#: [id number] BIRTHDATE: [birth year]  GENDER: Female ENDOSCOPIST: Rachael Fee, MD REFERRED BY:  Dow Adolph, MD at [facility] GI PROCEDURE DATE:  06/22/2013 PROCEDURE:   Upper EUS ASA CLASS:      Class III INDICATIONS:   abdominal pain led to labs, imaging: CT scan "cystic change in the body/head region...and in pancreatic body and tail region cannot be excluded." LFTs normal.  never had pancreatic disease, never etoh abuser, no weight loss. MEDICATIONS: MAC sedation, administered by CRNA  DESCRIPTION OF PROCEDURE:   After the risks benefits and alternatives of the procedure were  explained, informed consent was obtained. The patient was then placed in the left, lateral, decubitus postion and IV sedation was administered. Throughout the procedure, the patients blood pressure, pulse and oxygen saturations were monitored continuously.  Under direct visualization, the Pentax Radial EUS [id number]  endoscope was introduced through the mouth  and advanced to the second portion of the duodenum .  Water was used as necessary to provide an acoustic interface.  Upon completion of the imaging, water was removed and the patient was sent to the recovery room in satisfactory condition.   Endoscopic findings: 1. Normal UGI tract  EUS findings: 1. There a cysts throughout the pancreas (head, body and tail). These range in size from 1-41mm to 1cm across. In head of pancreas several cysts lay together in microcystic appearing morophology. Some of the cysts clearly communicate with the main pancreatic duct but the main pancreatic duct is never dilated and there are no associated solid masses or mural nodules. 2. The pancreatic parenchyma is otherwise normal appearing. 3. CBD is normal, non-dilated and without stones 4. Gallbladder is surgically absent 5.  No peripancreatic adenopathy. 6. Limited views of liver, spleen, portal and splenic vessels are all normal  Impression: Multiple cysts throughout otherwise normal appearing pancreatic parenchyma. There are no associated solid masses, mural nodules and the main pancreatic duct is non-dilated.  No FNA performed and these pancreatic cysts can be followed with serial imaging.  I suggest MRI abd with MRCP images in one year.  If no changes at that point, then repeat MRI two years from then.   _______________________________ eSigned:  Rachael Fee, MD 06/22/2013 1:36 PM

## 2013-06-22 NOTE — Transfer of Care (Signed)
Immediate Anesthesia Transfer of Care Note  Patient: Barbara Marks  Procedure(s) Performed: Procedure(s): UPPER ENDOSCOPIC ULTRASOUND (EUS) LINEAR (N/A)  Patient Location: PACU  Anesthesia Type:MAC  Level of Consciousness: awake, alert , oriented and patient cooperative  Airway & Oxygen Therapy: Patient Spontanous Breathing and Patient connected to nasal cannula oxygen  Post-op Assessment: Report given to PACU RN, Post -op Vital signs reviewed and stable and Patient moving all extremities X 4  Post vital signs: stable  Complications: No apparent anesthesia complications

## 2013-06-22 NOTE — Anesthesia Preprocedure Evaluation (Signed)
Anesthesia Evaluation  Patient identified by MRN, date of birth, ID band Patient awake    Reviewed: Allergy & Precautions, H&P , NPO status , Patient's Chart, lab work & pertinent test results  Airway Mallampati: II TM Distance: >3 FB Neck ROM: Full    Dental no notable dental hx.    Pulmonary neg pulmonary ROS,  breath sounds clear to auscultation  Pulmonary exam normal       Cardiovascular Exercise Tolerance: Good hypertension, Pt. on medications + CAD + dysrhythmias + pacemaker Rhythm:Regular Rate:Normal  Pacemaker placed for frequent PVCs per patient. She continues to have these frequent PVCs.  Perioperative prescription for pacemaker reviewed. ([name]). Magnet should be placed over device during procedure. No postop interrogation needed.   Neuro/Psych PSYCHIATRIC DISORDERS Depression negative neurological ROS     GI/Hepatic negative GI ROS, Neg liver ROS,   Endo/Other  negative endocrine ROS  Renal/GU negative Renal ROS  negative genitourinary   Musculoskeletal negative musculoskeletal ROS (+)   Abdominal   Peds negative pediatric ROS (+)  Hematology negative hematology ROS (+)   Anesthesia Other Findings   Reproductive/Obstetrics negative OB ROS                           Anesthesia Physical Anesthesia Plan  ASA: III  Anesthesia Plan: MAC   Post-op Pain Management:    Induction: Intravenous  Airway Management Planned:   Additional Equipment:   Intra-op Plan:   Post-operative Plan:   Informed Consent: I have reviewed the patients History and Physical, chart, labs and discussed the procedure including the risks, benefits and alternatives for the proposed anesthesia with the patient or authorized representative who has indicated his/her understanding and acceptance.   Dental advisory given  Plan Discussed with: CRNA  Anesthesia Plan Comments:         Anesthesia  Quick Evaluation

## 2013-06-22 NOTE — H&P (View-Only) (Signed)
Patient ID: Barbara Marks, female    DOB: March 10, 1934, 79 y.o.   MRN: [id number]  HPI Comments: Barbara Marks is a 79 year old woman,   cardiac cath in 2004 with nonobstructive CAD, pacemaker placed for bradycardia, chronic PVCs, profound significant long history of depression and anxiety requiring ECT treatment dating back to the 77s, history of kidney stones with lithotripsy, who presents for routine followup.  She reports that overall she is doing well. Recent diagnosis of diverticuli, DJD. She has had chronic abdominal pain. Found to have "polyps on her pancreas". Uncertain if she means a cyst. She has been seeing GI at [facility].  No significant chest discomfort or shortness of breath.  She reports that she continues to take red yeast rice  previously referred to [name] for her symptomatic PVCs. started on Rythmol b.i.d.,  improvement of her symptoms. PVCs noted today. She is asymptomatic. She has not had any side effects on the Rythmol.   Long history of  ECT treatments every 4-5 weeks.  She was followed by Dr. Alycia Rossetti.    Normal sinus rhythm with rate 83 beats per minute, normal sinus rhythm with PVCs in a bigeminal pattern  Outpatient Encounter Prescriptions as of 06/09/2013  Medication Sig Dispense Refill  . amLODipine (NORVASC) 5 MG tablet Take 5 mg by mouth daily.        [name] aspirin 81 MG tablet Take 1 tablet (81 mg total) by mouth daily.  30 tablet  0  . cholecalciferol (VITAMIN D) 1000 UNITS tablet Take 2,000 Units by mouth daily.        . ciprofloxacin (CIPRO) 250 MG tablet Take 250 mg by mouth 2 (two) times daily.       . Coenzyme Q10 (COQ10) 150 MG CAPS Take 1 capsule by mouth daily.        . Glucosamine-Chondroit-Vit C-Mn (GLUCOSAMINE-CHONDROITIN) CAPS Take by mouth daily.        . metroNIDAZOLE (FLAGYL) 500 MG tablet Take 500 mg by mouth 3 (three) times daily.       . Multiple Vitamin (MULTIVITAMIN) tablet Take 1 tablet by mouth daily.        . Multiple Vitamins-Minerals (ICAPS)  CAPS Take by mouth.      . potassium chloride (KLOR-CON) 10 MEQ CR tablet Take 10 mEq by mouth daily.        . propafenone (RYTHMOL) 225 MG tablet Take 1 tablet (225 mg total) by mouth 2 (two) times daily.  60 tablet  3  . Vitamin B Complex-C CAPS Take 1 capsule by mouth daily.         No facility-administered encounter medications on file as of 06/09/2013.      Review of Systems  Constitutional: Negative.   HENT: Negative.   Eyes: Negative.   Respiratory: Negative.   Cardiovascular: Negative.   Gastrointestinal: Negative.        Right lower quadrant discomfort  Musculoskeletal: Negative.   Skin: Negative.   Neurological: Negative.   Psychiatric/Behavioral: Positive for dysphoric mood.  All other systems reviewed and are negative.    BP 110/60  Pulse 83  Ht 5\' 6"  (1.676 m)  Wt 149 lb (67.586 kg)  BMI 24.06 kg/m2  Physical Exam  Nursing note and vitals reviewed. Constitutional: She is oriented to person, place, and time. She appears well-developed and well-nourished.  HENT:  Head: Normocephalic.  Nose: Nose normal.  Mouth/Throat: Oropharynx is clear and moist.  Eyes: Conjunctivae are normal. Pupils are equal, round, and reactive to  light.  Neck: Normal range of motion. Neck supple. No JVD present.  Cardiovascular: Normal rate, regular rhythm, S1 normal, S2 normal, normal heart sounds and intact distal pulses.  Exam reveals no gallop and no friction rub.   No murmur heard. Pulmonary/Chest: Effort normal and breath sounds normal. No respiratory distress. She has no wheezes. She has no rales. She exhibits no tenderness.  Abdominal: Soft. Bowel sounds are normal. She exhibits no distension. There is no tenderness.  Musculoskeletal: Normal range of motion. She exhibits no edema and no tenderness.  Lymphadenopathy:    She has no cervical adenopathy.  Neurological: She is alert and oriented to person, place, and time. Coordination normal.  Skin: Skin is warm and dry. No rash  noted. No erythema.  Psychiatric: She has a normal mood and affect. Her behavior is normal. Judgment and thought content normal.    Assessment and Plan

## 2013-06-22 NOTE — Discharge Instructions (Signed)

## 2013-06-22 NOTE — Interval H&P Note (Signed)
History and Physical Interval Note:  06/22/2013 1:05 PM  Barbara Marks  has presented today for surgery, with the diagnosis of abnormal pancreas  The various methods of treatment have been discussed with the patient and family. After consideration of risks, benefits and other options for treatment, the patient has consented to  Procedure(s): UPPER ENDOSCOPIC ULTRASOUND (EUS) LINEAR (N/A) as a surgical intervention .  The patient's history has been reviewed, patient examined, no change in status, stable for surgery.  I have reviewed the patient's chart and labs.  Questions were answered to the patient's satisfaction.     Rachael Fee

## 2013-06-23 ENCOUNTER — Encounter: Payer: Self-pay | Admitting: Family Medicine

## 2013-06-23 ENCOUNTER — Encounter (HOSPITAL_COMMUNITY): Payer: Self-pay | Admitting: Gastroenterology

## 2013-06-23 NOTE — Anesthesia Postprocedure Evaluation (Signed)
  Anesthesia Post-op Note  Patient: Barbara Marks  Procedure(s) Performed: Procedure(s) (LRB): UPPER ENDOSCOPIC ULTRASOUND (EUS) LINEAR (N/A)  Patient Location: PACU  Anesthesia Type: MAC  Level of Consciousness: awake and alert   Airway and Oxygen Therapy: Patient Spontanous Breathing  Post-op Pain: mild  Post-op Assessment: Post-op Vital signs reviewed, Patient's Cardiovascular Status Stable, Respiratory Function Stable, Patent Airway and No signs of Nausea or vomiting  Last Vitals:  Filed Vitals:   06/22/13 1355  BP: 121/81  Pulse:   Temp:   Resp: 16    Post-op Vital Signs: stable   Complications: No apparent anesthesia complications

## 2013-07-05 ENCOUNTER — Ambulatory Visit: Payer: Self-pay | Admitting: Psychiatry

## 2013-07-18 ENCOUNTER — Encounter: Payer: Self-pay | Admitting: Internal Medicine

## 2013-07-18 ENCOUNTER — Other Ambulatory Visit: Payer: Self-pay

## 2013-07-18 ENCOUNTER — Ambulatory Visit (INDEPENDENT_AMBULATORY_CARE_PROVIDER_SITE_OTHER): Payer: Medicare Other | Admitting: Internal Medicine

## 2013-07-18 VITALS — BP 124/78 | HR 91 | Ht 66.0 in | Wt 150.2 lb

## 2013-07-18 DIAGNOSIS — I447 Left bundle-branch block, unspecified: Secondary | ICD-10-CM

## 2013-07-18 DIAGNOSIS — I498 Other specified cardiac arrhythmias: Secondary | ICD-10-CM

## 2013-07-18 DIAGNOSIS — I4949 Other premature depolarization: Secondary | ICD-10-CM

## 2013-07-18 DIAGNOSIS — Z950 Presence of cardiac pacemaker: Secondary | ICD-10-CM

## 2013-07-18 DIAGNOSIS — I2510 Atherosclerotic heart disease of native coronary artery without angina pectoris: Secondary | ICD-10-CM

## 2013-07-18 LAB — PACEMAKER DEVICE OBSERVATION
AL IMPEDENCE PM: 346 Ohm
AL THRESHOLD: 1 V
RV LEAD AMPLITUDE: 8 mv
RV LEAD IMPEDENCE PM: 464 Ohm

## 2013-07-18 MED ORDER — PROPAFENONE HCL 225 MG PO TABS: 225.0000 mg | ORAL_TABLET | Freq: Two times a day (BID) | ORAL | Status: DC

## 2013-07-18 NOTE — Telephone Encounter (Signed)
Refilled Rhthmol sent to [facility] pharmacy.

## 2013-07-18 NOTE — Assessment & Plan Note (Signed)
The patient's device was interrogated.  The information was reviewed. No changes were made in the programming.    

## 2013-07-18 NOTE — Progress Notes (Signed)
      Patient Care Team: [name] as PCP - General (Family Medicine)   HPI  Barbara Marks is a 79 y.o. female Seen in followup for fatigue and apparent bradycardia. She is status post pacemaker implantation wit h generator replacement May 2011   She has known PVCs giving rise to bigeminy for which we ultimately  initiated propafenone  Has history cardiac cath in 2004 with nonobstructive CAD and profound significant long history of depression and anxiety requiring ECT treatment dating back to the 80s   No significant heart complaints     Past Medical History  Diagnosis Date  . Depression   . Premature ventricular contraction   . Hyperlipidemia, mixed   . Bradycardia   . Unspecified essential hypertension   . Pacemaker   . History of kidney stones   . Diverticulitis   . Memory problem     "states memory issues"    Past Surgical History  Procedure Laterality Date  . Appendectomy  1952  . Tumor excision      And nemamgeomas  . Cholecystectomy  1983  . Pacemaker insertion Left     Medtronic  . Eus N/A 06/22/2013    Procedure: UPPER ENDOSCOPIC ULTRASOUND (EUS) LINEAR;  Surgeon: Rachael Fee, MD;  Location: [facility];  Service: Endoscopy;  Laterality: N/A;    Current Outpatient Prescriptions  Medication Sig Dispense Refill  . amLODipine (NORVASC) 5 MG tablet Take 5 mg by mouth every morning.       [name] aspirin 81 MG tablet Take 81 mg by mouth as needed.      . cholecalciferol (VITAMIN D) 1000 UNITS tablet Take 2,000 Units by mouth daily.        . Coenzyme Q10 (COQ10) 150 MG CAPS Take 1 capsule by mouth daily.        . Glucosamine-Chondroit-Vit C-Mn (GLUCOSAMINE-CHONDROITIN) CAPS Take 1 capsule by mouth daily.       . Multiple Vitamin (MULTIVITAMIN) tablet Take 1 tablet by mouth daily.        . multivitamin-lutein (OCUVITE-LUTEIN) CAPS capsule Take 1 capsule by mouth daily.      . polyethylene glycol (MIRALAX / GLYCOLAX) packet Take 17 g by mouth as needed.        . potassium chloride (KLOR-CON) 10 MEQ CR tablet Take 10 mEq by mouth daily.        . Vitamin B Complex-C CAPS Take 1 capsule by mouth daily.        . propafenone (RYTHMOL) 225 MG tablet Take 225 mg by mouth 2 (two) times daily.       No current facility-administered medications for this visit.    Allergies  Allergen Reactions  . Codeine     unknown  . Simvastatin     unknown    Review of Systems negative except from HPI and PMH  Physical Exam BP 124/78  Pulse 91  Ht 5\' 6"  (1.676 m)  Wt 150 lb 4 oz (68.153 kg)  BMI 24.26 kg/m2  SpO2 96% Well developed and well nourished in no acute distress HENT normal E scleral and icterus clear Neck Supple JVP flat; carotids brisk and full Clear to ausculation  regular rate and rhythm, no murmurs gallops or rub Soft with active bowel sounds No clubbing cyanosis none Edema Alert and oriented, grossly normal motor and sensory function Skin Warm and Dry    Assessment and  Plan

## 2013-07-18 NOTE — Assessment & Plan Note (Signed)
Still w frequent PVCs  Tolerating propafenone and maybe less symptomatic

## 2013-07-18 NOTE — Patient Instructions (Signed)

## 2013-08-05 ENCOUNTER — Ambulatory Visit: Payer: Self-pay | Admitting: Psychiatry

## 2013-08-06 ENCOUNTER — Ambulatory Visit: Payer: Self-pay | Admitting: Psychiatry

## 2013-09-04 ENCOUNTER — Ambulatory Visit: Payer: Self-pay | Admitting: Psychiatry

## 2013-09-06 ENCOUNTER — Observation Stay (HOSPITAL_COMMUNITY)
Admission: EM | Admit: 2013-09-06 | Discharge: 2013-09-08 | Disposition: A | Discharge status: Home / Self Care | Payer: Medicare Other | Attending: Internal Medicine | Admitting: Internal Medicine

## 2013-09-06 ENCOUNTER — Emergency Department (HOSPITAL_COMMUNITY): Payer: Medicare Other

## 2013-09-06 ENCOUNTER — Encounter (HOSPITAL_COMMUNITY): Payer: Self-pay | Admitting: Emergency Medicine

## 2013-09-06 DIAGNOSIS — Z7982 Long term (current) use of aspirin: Secondary | ICD-10-CM | POA: Insufficient documentation

## 2013-09-06 DIAGNOSIS — R0789 Other chest pain: Principal | ICD-10-CM | POA: Insufficient documentation

## 2013-09-06 DIAGNOSIS — D649 Anemia, unspecified: Secondary | ICD-10-CM | POA: Diagnosis present

## 2013-09-06 DIAGNOSIS — E876 Hypokalemia: Secondary | ICD-10-CM

## 2013-09-06 DIAGNOSIS — R1011 Right upper quadrant pain: Secondary | ICD-10-CM

## 2013-09-06 DIAGNOSIS — Z950 Presence of cardiac pacemaker: Secondary | ICD-10-CM | POA: Insufficient documentation

## 2013-09-06 DIAGNOSIS — F329 Major depressive disorder, single episode, unspecified: Secondary | ICD-10-CM

## 2013-09-06 DIAGNOSIS — Z888 Allergy status to other drugs, medicaments and biological substances status: Secondary | ICD-10-CM | POA: Insufficient documentation

## 2013-09-06 DIAGNOSIS — R079 Chest pain, unspecified: Secondary | ICD-10-CM | POA: Diagnosis present

## 2013-09-06 DIAGNOSIS — Z87442 Personal history of urinary calculi: Secondary | ICD-10-CM | POA: Insufficient documentation

## 2013-09-06 DIAGNOSIS — Z8719 Personal history of other diseases of the digestive system: Secondary | ICD-10-CM | POA: Insufficient documentation

## 2013-09-06 DIAGNOSIS — R9431 Abnormal electrocardiogram [ECG] [EKG]: Secondary | ICD-10-CM

## 2013-09-06 DIAGNOSIS — I4949 Other premature depolarization: Secondary | ICD-10-CM | POA: Insufficient documentation

## 2013-09-06 DIAGNOSIS — F3289 Other specified depressive episodes: Secondary | ICD-10-CM | POA: Insufficient documentation

## 2013-09-06 DIAGNOSIS — Z885 Allergy status to narcotic agent status: Secondary | ICD-10-CM | POA: Insufficient documentation

## 2013-09-06 DIAGNOSIS — IMO0002 Reserved for concepts with insufficient information to code with codable children: Secondary | ICD-10-CM | POA: Insufficient documentation

## 2013-09-06 DIAGNOSIS — E782 Mixed hyperlipidemia: Secondary | ICD-10-CM | POA: Insufficient documentation

## 2013-09-06 DIAGNOSIS — R413 Other amnesia: Secondary | ICD-10-CM | POA: Insufficient documentation

## 2013-09-06 DIAGNOSIS — Z79899 Other long term (current) drug therapy: Secondary | ICD-10-CM | POA: Insufficient documentation

## 2013-09-06 DIAGNOSIS — I10 Essential (primary) hypertension: Secondary | ICD-10-CM | POA: Diagnosis present

## 2013-09-06 DIAGNOSIS — I2510 Atherosclerotic heart disease of native coronary artery without angina pectoris: Secondary | ICD-10-CM | POA: Insufficient documentation

## 2013-09-06 HISTORY — DX: Calculus of kidney: N20.0

## 2013-09-06 HISTORY — DX: Reserved for concepts with insufficient information to code with codable children: IMO0002

## 2013-09-06 HISTORY — DX: Cyst of pancreas: K86.2

## 2013-09-06 HISTORY — DX: Atherosclerotic heart disease of native coronary artery without angina pectoris: I25.10

## 2013-09-06 HISTORY — DX: Essential (primary) hypertension: I10

## 2013-09-06 HISTORY — DX: Bradycardia, unspecified: R00.1

## 2013-09-06 LAB — BASIC METABOLIC PANEL WITH GFR
BUN: 14 mg/dL (ref 6–23)
CO2: 26 meq/L (ref 19–32)
Calcium: 9.4 mg/dL (ref 8.4–10.5)
Chloride: 101 meq/L (ref 96–112)
Creatinine, Ser: 0.75 mg/dL (ref 0.50–1.10)
GFR calc Af Amer: >90 mL/min
GFR calc non Af Amer: 80 mL/min — ABNORMAL LOW
Glucose, Bld: 103 mg/dL — ABNORMAL HIGH (ref 70–99)
Potassium: 3.3 meq/L — ABNORMAL LOW (ref 3.5–5.1)
Sodium: 140 meq/L (ref 135–145)

## 2013-09-06 LAB — HEPATIC FUNCTION PANEL
ALT: 12 U/L (ref 0–35)
AST: 20 U/L (ref 0–37)
Alkaline Phosphatase: 52 U/L (ref 39–117)
Bilirubin, Direct: 0.1 mg/dL (ref 0.0–0.3)
Indirect Bilirubin: 0.5 mg/dL (ref 0.3–0.9)
Total Bilirubin: 0.6 mg/dL (ref 0.3–1.2)

## 2013-09-06 LAB — CBC
MCH: 27.5 pg (ref 26.0–34.0)
MCHC: 29.9 g/dL — ABNORMAL LOW (ref 30.0–36.0)
Platelets: 155 10*3/uL (ref 150–400)
RDW: 13 % (ref 11.5–15.5)

## 2013-09-06 LAB — TROPONIN I: Troponin I: <0.3 ng/mL (ref <0.30)

## 2013-09-06 LAB — POCT I-STAT TROPONIN I: Troponin i, poc: 0.01 ng/mL (ref 0.00–0.08)

## 2013-09-06 MED ORDER — POTASSIUM CHLORIDE CRYS ER 20 MEQ PO TBCR
40.0000 meq | EXTENDED_RELEASE_TABLET | ORAL | Status: AC
  Administered 2013-09-06 (×2): 40 meq via ORAL
  Filled 2013-09-06 (×2): qty 2

## 2013-09-06 MED ORDER — ONDANSETRON HCL 4 MG PO TABS: 4.0000 mg | ORAL_TABLET | Freq: Four times a day (QID) | ORAL | Status: DC | PRN

## 2013-09-06 MED ORDER — VITAMIN D3 25 MCG (1000 UNIT) PO TABS
2000.0000 [IU] | ORAL_TABLET | Freq: Every day | ORAL | Status: DC
  Administered 2013-09-06 – 2013-09-08 (×3): 2000 [IU] via ORAL
  Filled 2013-09-06 (×3): qty 2

## 2013-09-06 MED ORDER — HYDROMORPHONE HCL PF 1 MG/ML IJ SOLN
1.0000 mg | INTRAMUSCULAR | Status: AC | PRN
  Administered 2013-09-06: 1 mg via INTRAVENOUS
  Filled 2013-09-06: qty 1

## 2013-09-06 MED ORDER — PROPAFENONE HCL ER 225 MG PO CP12
225.0000 mg | ORAL_CAPSULE | Freq: Every day | ORAL | Status: DC | PRN
Start: 2013-09-06 — End: 2013-09-08
  Filled 2013-09-06: qty 1

## 2013-09-06 MED ORDER — ALUM & MAG HYDROXIDE-SIMETH 200-200-20 MG/5ML PO SUSP: 30.0000 mL | Freq: Four times a day (QID) | ORAL | Status: DC | PRN

## 2013-09-06 MED ORDER — MORPHINE SULFATE 4 MG/ML IJ SOLN
4.0000 mg | Freq: Once | INTRAMUSCULAR | Status: AC
  Administered 2013-09-06: 4 mg via INTRAVENOUS
  Filled 2013-09-06: qty 1

## 2013-09-06 MED ORDER — SODIUM CHLORIDE 0.9 % IJ SOLN
3.0000 mL | Freq: Two times a day (BID) | INTRAMUSCULAR | Status: DC
  Administered 2013-09-06 (×2): 3 mL via INTRAVENOUS

## 2013-09-06 MED ORDER — ONDANSETRON HCL 4 MG/2ML IJ SOLN
4.0000 mg | Freq: Three times a day (TID) | INTRAMUSCULAR | Status: AC | PRN
  Administered 2013-09-06: 4 mg via INTRAVENOUS
  Filled 2013-09-06: qty 2

## 2013-09-06 MED ORDER — ONDANSETRON HCL 4 MG/2ML IJ SOLN: 4.0000 mg | Freq: Four times a day (QID) | INTRAMUSCULAR | Status: DC | PRN

## 2013-09-06 MED ORDER — SODIUM CHLORIDE 0.9 % IV SOLN: 250.0000 mL | INTRAVENOUS | Status: DC | PRN

## 2013-09-06 MED ORDER — OCUVITE-LUTEIN PO CAPS
1.0000 | ORAL_CAPSULE | Freq: Every day | ORAL | Status: DC
  Administered 2013-09-06 – 2013-09-08 (×3): 1 via ORAL
  Filled 2013-09-06 (×3): qty 1

## 2013-09-06 MED ORDER — ENOXAPARIN SODIUM 40 MG/0.4ML ~~LOC~~ SOLN
40.0000 mg | SUBCUTANEOUS | Status: DC
  Administered 2013-09-06 – 2013-09-07 (×2): 40 mg via SUBCUTANEOUS
  Filled 2013-09-06 (×3): qty 0.4

## 2013-09-06 MED ORDER — SODIUM CHLORIDE 0.9 % IJ SOLN
3.0000 mL | Freq: Two times a day (BID) | INTRAMUSCULAR | Status: DC
  Administered 2013-09-07: 3 mL via INTRAVENOUS

## 2013-09-06 MED ORDER — SODIUM CHLORIDE 0.9 % IJ SOLN
3.0000 mL | INTRAMUSCULAR | Status: DC | PRN
  Administered 2013-09-07: 3 mL via INTRAVENOUS

## 2013-09-06 MED ORDER — POLYETHYLENE GLYCOL 3350 17 G PO PACK
17.0000 g | PACK | ORAL | Status: DC | PRN
  Administered 2013-09-07: 17 g via ORAL
  Filled 2013-09-06 (×2): qty 1

## 2013-09-06 MED ORDER — MORPHINE SULFATE 2 MG/ML IJ SOLN
2.0000 mg | INTRAMUSCULAR | Status: DC | PRN
Start: 2013-09-06 — End: 2013-09-08

## 2013-09-06 MED ORDER — AMLODIPINE BESYLATE 5 MG PO TABS
5.0000 mg | ORAL_TABLET | Freq: Every morning | ORAL | Status: DC
  Administered 2013-09-07 – 2013-09-08 (×2): 5 mg via ORAL
  Filled 2013-09-06 (×2): qty 1

## 2013-09-06 NOTE — H&P (Signed)
PCP:   [name], MD   Chief Complaint:  Chest pain  HPI: 79 year old female with a history of symptomatic PVCs, on Rythmol, status post pacemaker. Patient also has history of depression and was getting ECT treatments in the past, with some improvement, chronic back pain, history of kidney stones, hyperlipidemia came to the ED today for chest pain which started 2 days ago. The pain is located in the left side of the chest wall and radiates to the left arm. It is not associated with shortness of breath no nausea vomiting or diarrhea. Patient says that the pain is now easing of cystic him to the hospital. EKG shows only PVCs and QTC prolongation. Patient has history of depression, she is not on any antidepressant medications and denies suicidal ideation.  Allergies:   Allergies  Allergen Reactions  . Codeine     unknown  . Simvastatin     unknown      Past Medical History  Diagnosis Date  . Depression   . Premature ventricular contraction   . Hyperlipidemia, mixed   . Bradycardia   . Unspecified essential hypertension   . Pacemaker   . History of kidney stones   . Diverticulitis   . Memory problem     "states memory issues"    Past Surgical History  Procedure Laterality Date  . Appendectomy  1952  . Tumor excision      And nemamgeomas  . Cholecystectomy  1983  . Pacemaker insertion Left     [employer]tr[employer]  . Eus N/A 06/22/2013    Procedure: UPPER ENDOSCOPIC ULTRASOUND (EUS) LINEAR;  Surgeon: Rachael Fee, MD;  Location: [facility];  Service: Endoscopy;  Laterality: N/A;    Prior to Admission medications   Medication Sig Start Date End Date Taking? Authorizing Provider  amLODipine (NORVASC) 5 MG tablet Take 5 mg by mouth every morning.    Yes Historical Provider, MD  cholecalciferol (VITAMIN D) 1000 UNITS tablet Take 2,000 Units by mouth daily.     Yes Historical Provider, MD  Coenzyme Q10 (COQ10) 150 MG CAPS Take 1 capsule by mouth daily.     Yes Historical  Provider, MD  Glucosamine-Chondroit-Vit C-Mn (GLUCOSAMINE-CHONDROITIN) CAPS Take 1 capsule by mouth daily.    Yes Historical Provider, MD  Multiple Vitamin (MULTIVITAMIN) tablet Take 1 tablet by mouth daily.     Yes Historical Provider, MD  multivitamin-lutein (OCUVITE-LUTEIN) CAPS capsule Take 1 capsule by mouth daily.   Yes Historical Provider, MD  polyethylene glycol (MIRALAX / GLYCOLAX) packet Take 17 g by mouth as needed for mild constipation.    Yes Historical Provider, MD  propafenone (RYTHMOL SR) 225 MG 12 hr capsule Take 225 mg by mouth daily as needed (if feels like heart rhythm is irregular).   Yes Historical Provider, MD  Vitamin B Complex-C CAPS Take 1 capsule by mouth daily.     Yes Historical Provider, MD    Social History:  reports that she has never smoked. She does not have any smokeless tobacco history on file. She reports that she does not drink alcohol or use illicit drugs.  Patient's father died of stroke. No family history of heart problems  All the positives are listed in BOLD  Review of Systems:  HEENT: Headache, blurred vision, runny nose, sore throat Neck: Hypothyroidism, hyperthyroidism,,lymphadenopathy Chest : Shortness of breath, history of COPD, Asthma Heart : Chest pain, history of coronary arterey disease GI:  Nausea, vomiting, diarrhea, constipation, GERD GU: Dysuria, urgency, frequency of urination, hematuria  Neuro: Stroke, seizures, syncope Psych: Depression, anxiety, hallucinations   Physical Exam: Blood pressure 124/86, pulse 69, temperature 97.9 F (36.6 C), temperature source Oral, resp. rate 14, height 5\' 6"  (1.676 m), weight 69.4 kg (153 lb), SpO2 100.00%. Constitutional:   Patient is a well-developed and well-nourished *female in no acute distress and cooperative with exam. Head: Normocephalic and atraumatic Mouth: Mucus membranes moist Eyes: PERRL, EOMI, conjunctivae normal Neck: Supple, No Thyromegaly Cardiovascular: RRR, S1 normal, S2  normal Pulmonary/Chest: CTAB, no wheezes, rales, or rhonchi Abdominal: Soft. Non-tender, non-distended, bowel sounds are normal, no masses, organomegaly, or guarding present.  Neurological: A&O x3, Strenght is normal and symmetric bilaterally, cranial nerve II-XII are grossly intact, no focal motor deficit, sensory intact to light touch bilaterally.  Extremities : No Cyanosis, Clubbing or Edema   Labs on Admission:  Results for orders placed during the hospital encounter of 09/06/13 (from the past 48 hour(s))  CBC     Status: Abnormal   Collection Time    09/06/13  9:31 AM      Result Value Range   WBC 6.0  4.0 - 10.5 K/uL   RBC 4.03  3.87 - 5.11 MIL/uL   Hemoglobin 11.1 (*) 12.0 - 15.0 g/dL   HCT 45.4  09.8 - 11.9 %   MCV 92.1  78.0 - 100.0 fL   MCH 27.5  26.0 - 34.0 pg   MCHC 29.9 (*) 30.0 - 36.0 g/dL   RDW 14.7  82.9 - 56.2 %   Platelets 155  150 - 400 K/uL  BASIC METABOLIC PANEL     Status: Abnormal   Collection Time    09/06/13  9:31 AM      Result Value Range   Sodium 140  135 - 145 mEq/L   Potassium 3.3 (*) 3.5 - 5.1 mEq/L   Chloride 101  96 - 112 mEq/L   CO2 26  19 - 32 mEq/L   Glucose, Bld 103 (*) 70 - 99 mg/dL   BUN 14  6 - 23 mg/dL   Creatinine, Ser 1.30  0.50 - 1.10 mg/dL   Calcium 9.4  8.4 - 86.5 mg/dL   GFR calc non Af Amer 80 (*) >90 mL/min   GFR calc Af Amer >90  >90 mL/min   Comment: (NOTE)     The eGFR has been calculated using the CKD EPI equation.     This calculation has not been validated in all clinical situations.     eGFR's persistently <90 mL/min signify possible Chronic Kidney     Disease.  HEPATIC FUNCTION PANEL     Status: None   Collection Time    09/06/13  9:31 AM      Result Value Range   Total Protein 6.6  6.0 - 8.3 g/dL   Albumin 3.5  3.5 - 5.2 g/dL   AST 20  0 - 37 U/L   ALT 12  0 - 35 U/L   Alkaline Phosphatase 52  39 - 117 U/L   Total Bilirubin 0.6  0.3 - 1.2 mg/dL   Bilirubin, Direct 0.1  0.0 - 0.3 mg/dL   Indirect Bilirubin  0.5  0.3 - 0.9 mg/dL  LIPASE, BLOOD     Status: None   Collection Time    09/06/13  9:31 AM      Result Value Range   Lipase 31  11 - 59 U/L  POCT I-STAT TROPONIN I     Status: None   Collection Time  09/06/13  9:58 AM      Result Value Range   Troponin i, poc 0.01  0.00 - 0.08 ng/mL   Comment 3            Comment: Due to the release kinetics of cTnI,     a negative result within the first hours     of the onset of symptoms does not rule out     myocardial infarction with certainty.     If myocardial infarction is still suspected,     repeat the test at appropriate intervals.    Radiological Exams on Admission: Dg Chest 2 View  09/06/2013   CLINICAL DATA:  Chest pain.  EXAM: CHEST  2 VIEW  COMPARISON:  None.  FINDINGS: Dual lead pacer noted with lead tips projecting over the right atrium and ventricle, respectively. Mild cardiomegaly.  Old granulomatous disease noted with calcified bilateral hilar lymph nodes and calcified mediastinal nodes. No pleural effusion or airspace opacity. Mild thoracic spondylosis.  IMPRESSION: 1. Cardiomegaly, without edema. 2. Old granulomatous disease.   Electronically Signed   By: Herbie Baltimore M.D.   On: 09/06/2013 09:58    Assessment/Plan Active Problems:   Chest pain  79 year old female with long-standing history of depression, PVCs status post pacemaker came with left-sided chest pain, cardiac enzymes x1 are negative in the ED. EKG shows PVCs and QTC prolongation. She does have low potassium. We'll replace the potassium and check serum magnesium. Magnesium will be replaced if it is below the normal range. Patient will be admitted under observation in telemetry, will obtain 3 sets of cardiac enzymes. Patient has history of depression but is not on any SSRIs due to history of side effects. Patient has been on ECTs in the past Patient has history of hyperlipidemia and takes red yeast rice. Will give patient DVT prophylaxis with Lovenox Patient  has history of sarcoidosis.  Code status: Full code  Family discussion: Discussed with patient's friend at bedside   Time Spent on Admission: 75 min  [facility] Pager: [phone] 09/06/2013, 12:38 PM  If 7PM-7AM, please contact night-coverage  [email or web address]  Password [email or web address]

## 2013-09-06 NOTE — ED Notes (Signed)
Pt returned from radiology.

## 2013-09-06 NOTE — ED Notes (Signed)
Patient transported to X-ray 

## 2013-09-06 NOTE — ED Notes (Signed)
RN on 3W, reports they can't accept pt at this time d/t pt having active CP.

## 2013-09-06 NOTE — ED Provider Notes (Signed)
CSN: [id number]     Arrival date & time 09/06/13  0910 History   First MD Initiated Contact with Patient 09/06/13 0915     Chief Complaint  Patient presents with  . Chest Pain   (Consider location/radiation/quality/duration/timing/severity/associated sxs/prior Treatment) HPI Presents with chest pain.  Pain began several days ago K. worse over the past half day.  The pain is anterior, radiating to the left shoulder.  No report of the fall, trauma, injury. As the pain has become worse, she felt pressure and tightness. She denies no lightheadedness, syncope, nausea, vomiting, fevers, chills, cough. Patient has history of PVC, no known CAD. No relief with OTC medication.  Past Medical History  Diagnosis Date  . Depression   . Premature ventricular contraction   . Hyperlipidemia, mixed   . Bradycardia   . Unspecified essential hypertension   . Pacemaker   . History of kidney stones   . Diverticulitis   . Memory problem     "states memory issues"   Past Surgical History  Procedure Laterality Date  . Appendectomy  1952  . Tumor excision      And nemamgeomas  . Cholecystectomy  1983  . Pacemaker insertion Left     [employer]  . Eus N/A 06/22/2013    Procedure: UPPER ENDOSCOPIC ULTRASOUND (EUS) LINEAR;  Surgeon: Rachael Fee, MD;  Location: [facility];  Service: Endoscopy;  Laterality: N/A;   No family history on file. History  Substance Use Topics  . Smoking status: Never Smoker   . Smokeless tobacco: Not on file  . Alcohol Use: No   OB History   Grav Para Term Preterm Abortions TAB SAB Ect Mult Living                 Review of Systems  Constitutional:       Per HPI, otherwise negative  HENT:       Per HPI, otherwise negative  Respiratory:       Per HPI, otherwise negative  Cardiovascular:       Per HPI, otherwise negative  Gastrointestinal: Negative for vomiting.  Endocrine:       Negative aside from HPI  Genitourinary:       Neg aside from HPI     Musculoskeletal:       Per HPI, otherwise negative  Skin: Negative.   Neurological: Negative for syncope.    Allergies  Codeine and Simvastatin  Home Medications   Current Outpatient Rx  Name  Route  Sig  Dispense  Refill  . amLODipine (NORVASC) 5 MG tablet   Oral   Take 5 mg by mouth every morning.          [name] aspirin 81 MG tablet   Oral   Take 81 mg by mouth as needed.         . cholecalciferol (VITAMIN D) 1000 UNITS tablet   Oral   Take 2,000 Units by mouth daily.           . Coenzyme Q10 (COQ10) 150 MG CAPS   Oral   Take 1 capsule by mouth daily.           . Glucosamine-Chondroit-Vit C-Mn (GLUCOSAMINE-CHONDROITIN) CAPS   Oral   Take 1 capsule by mouth daily.          . Multiple Vitamin (MULTIVITAMIN) tablet   Oral   Take 1 tablet by mouth daily.           . multivitamin-lutein (OCUVITE-LUTEIN) CAPS capsule  Oral   Take 1 capsule by mouth daily.         . polyethylene glycol (MIRALAX / GLYCOLAX) packet   Oral   Take 17 g by mouth as needed.          . potassium chloride (KLOR-CON) 10 MEQ CR tablet   Oral   Take 10 mEq by mouth daily.           . propafenone (RYTHMOL) 225 MG tablet   Oral   Take 1 tablet (225 mg total) by mouth 2 (two) times daily.   60 tablet   3   . Vitamin B Complex-C CAPS   Oral   Take 1 capsule by mouth daily.            BP 132/72  Pulse 40  Resp 19  SpO2 100% Physical Exam  Nursing note and vitals reviewed. Constitutional: She is oriented to person, place, and time. She appears well-developed and well-nourished. No distress.  HENT:  Head: Normocephalic and atraumatic.  Eyes: Conjunctivae and EOM are normal.  Cardiovascular: Normal rate and regular rhythm.   Pulmonary/Chest: Effort normal and breath sounds normal. No stridor. No respiratory distress.    Abdominal: She exhibits no distension.  Musculoskeletal: She exhibits no edema.  Neurological: She is alert and oriented to person, place, and  time. No cranial nerve deficit.  Skin: Skin is warm and dry.  Psychiatric: She has a normal mood and affect.    ED Course  Procedures (including critical care time) Labs Review Labs Reviewed  CBC - Abnormal; Notable for the following:    Hemoglobin 11.1 (*)    MCHC 29.9 (*)    All other components within normal limits  BASIC METABOLIC PANEL  HEPATIC FUNCTION PANEL  LIPASE, BLOOD   Imaging Review Dg Chest 2 View  09/06/2013   CLINICAL DATA:  Chest pain.  EXAM: CHEST  2 VIEW  COMPARISON:  None.  FINDINGS: Dual lead pacer noted with lead tips projecting over the right atrium and ventricle, respectively. Mild cardiomegaly.  Old granulomatous disease noted with calcified bilateral hilar lymph nodes and calcified mediastinal nodes. No pleural effusion or airspace opacity. Mild thoracic spondylosis.  IMPRESSION: 1. Cardiomegaly, without edema. 2. Old granulomatous disease.   Electronically Signed   By: Herbie Baltimore M.D.   On: 09/06/2013 09:58    EKG Interpretation    Date/Time:  Wednesday September 06 2013 09:17:43 EST Ventricular Rate:  84 PR Interval:  190 QRS Duration: 107 QT Interval:  436 QTC Calculation: 515 R Axis:   -64 Text Interpretation:  Sinus rhythm Ventricular bigeminy Left anterior fascicular block Probable left ventricular hypertrophy Nonspecific T abnormalities, lateral leads Prolonged QT interval Sinus rhythm Premature ventricular complexes Artifact QT prolonged Abnormal ekg Confirmed by Gerhard Munch  MD ([id number]) on 09/06/2013 10:04:52 AM           11:35 AM Patient now describes return of pain. Morphine provided   MDM  No diagnosis found. This patient presents with chest pain.  The pain has been in one week, her EKG is reassuring, initial labs were reassuring, with her age, risk factors, history of these make her basement, she was admitted for further evaluation and management.    Gerhard Munch, MD 09/06/13 1147

## 2013-09-06 NOTE — ED Notes (Signed)
Placed and diet order

## 2013-09-06 NOTE — ED Notes (Signed)
Pt reports she is nervous because of everything going on, sts she think this is her anxiety.

## 2013-09-06 NOTE — ED Notes (Signed)
Per EMS - pt c/o chest pain with radiation to left shoulder that started last night and still there when she woke up this morning. Describes it as a pressure and tightness. Hx of PVCs. Neighbor gave 650 mg of ASA. ems administered 4 Nitro. Before pain was 8/10. After meds rates pain at 4/10. 12 lead showing bigeminy PVCs. 18G in right wrist. BP 106/58, HR 80, 100% on room air. RR 16.

## 2013-09-07 ENCOUNTER — Observation Stay (HOSPITAL_COMMUNITY): Payer: Medicare Other

## 2013-09-07 ENCOUNTER — Inpatient Hospital Stay (HOSPITAL_COMMUNITY): Admit: 2013-09-07 | Payer: Medicare Other

## 2013-09-07 ENCOUNTER — Encounter (HOSPITAL_COMMUNITY): Payer: Self-pay | Admitting: Nurse Practitioner

## 2013-09-07 ENCOUNTER — Other Ambulatory Visit (HOSPITAL_COMMUNITY): Payer: Medicare Other

## 2013-09-07 DIAGNOSIS — R079 Chest pain, unspecified: Secondary | ICD-10-CM

## 2013-09-07 DIAGNOSIS — I517 Cardiomegaly: Secondary | ICD-10-CM

## 2013-09-07 DIAGNOSIS — D649 Anemia, unspecified: Secondary | ICD-10-CM

## 2013-09-07 DIAGNOSIS — E876 Hypokalemia: Secondary | ICD-10-CM

## 2013-09-07 DIAGNOSIS — R9431 Abnormal electrocardiogram [ECG] [EKG]: Secondary | ICD-10-CM

## 2013-09-07 LAB — COMPREHENSIVE METABOLIC PANEL
ALT: 15 U/L (ref 0–35)
AST: 24 U/L (ref 0–37)
Alkaline Phosphatase: 47 U/L (ref 39–117)
BUN: 16 mg/dL (ref 6–23)
CO2: 26 mEq/L (ref 19–32)
Calcium: 8.9 mg/dL (ref 8.4–10.5)
Chloride: 101 mEq/L (ref 96–112)
GFR calc Af Amer: 71 mL/min — ABNORMAL LOW (ref >90)
GFR calc non Af Amer: 61 mL/min — ABNORMAL LOW (ref >90)
Glucose, Bld: 92 mg/dL (ref 70–99)
Sodium: 136 mEq/L (ref 135–145)
Total Bilirubin: 0.4 mg/dL (ref 0.3–1.2)

## 2013-09-07 LAB — CBC
Hemoglobin: 11.6 g/dL — ABNORMAL LOW (ref 12.0–15.0)
MCH: 31.1 pg (ref 26.0–34.0)
RBC: 3.73 MIL/uL — ABNORMAL LOW (ref 3.87–5.11)
RDW: 13 % (ref 11.5–15.5)
WBC: 4.7 10*3/uL (ref 4.0–10.5)

## 2013-09-07 LAB — MAGNESIUM: Magnesium: 2 mg/dL (ref 1.5–2.5)

## 2013-09-07 MED ORDER — LORAZEPAM 0.5 MG PO TABS
0.5000 mg | ORAL_TABLET | Freq: Three times a day (TID) | ORAL | Status: DC | PRN
  Administered 2013-09-07 – 2013-09-08 (×3): 0.5 mg via ORAL
  Filled 2013-09-07 (×3): qty 1

## 2013-09-07 MED ORDER — NITROGLYCERIN 0.4 MG SL SUBL
0.4000 mg | SUBLINGUAL_TABLET | SUBLINGUAL | Status: DC | PRN
  Administered 2013-09-07: 0.4 mg via SUBLINGUAL
  Filled 2013-09-07: qty 25

## 2013-09-07 MED ORDER — REGADENOSON 0.4 MG/5ML IV SOLN
0.4000 mg | Freq: Once | INTRAVENOUS | Status: AC
  Administered 2013-09-08: 0.4 mg via INTRAVENOUS
  Filled 2013-09-07: qty 5

## 2013-09-07 NOTE — Consult Note (Signed)
CARDIOLOGY CONSULT NOTE   Patient ID: Barbara Marks MRN: [id number], DOB/AGE: 1933-10-18   Admit date: 09/06/2013 Date of Consult: 09/07/2013  Primary Physician: [name], MD Primary Cardiologist: Concha Se, MD / S. Graciela Husbands, MD   Pt. Profile  79 y/o female with h/o nonobstructive CAD and symptomatic bradycardia s/p PPM, who presented to the ED yesterday with chest pain.  Problem List  Past Medical History  Diagnosis Date  . Depression     a. h/o ECT  . Premature ventricular contraction     a. managed with propafenone.  . Hyperlipidemia, mixed   . Symptomatic bradycardia     a. s/p MDT PPM in 08/2005;  b. 02/2010 Gen change->MDT Adapta DC PPM, ser # [id number] H.  . HTN (hypertension)   . Nephrolithiasis   . Diverticulitis   . Memory problem     "states memory issues"  . CAD (coronary artery disease) Non-obstructive    a. 2004 Cath: nonobs dzs.  . Pancreatic cyst     a. [id number] Endoscopic U/S: nl UGI tract, 1-83mm pancreatic cysts, no masses/nodules.  . DDD (degenerative disc disease)     a. with chronic right sided back pain - improves after seeing chiropractor.    Past Surgical History  Procedure Laterality Date  . Appendectomy  1952  . Tumor excision      And nemamgeomas  . Cholecystectomy  1983  . Pacemaker insertion Left     [employer]  . Eus N/A 06/22/2013    Procedure: UPPER ENDOSCOPIC ULTRASOUND (EUS) LINEAR;  Surgeon: Rachael Fee, MD;  Location: [facility];  Service: Endoscopy;  Laterality: N/A;    Allergies  Allergies  Allergen Reactions  . Codeine     unknown  . Simvastatin     unknown   HPI   79 y/o female with a long h/o clinical depression previously managed with ECT.  She also has a long h/o symptomatic PVC's.  She says that PPM was placed in [facility] (when she was living there) secondary to PVC's.  This has since been changed out, most recently in 02/2010 (MDT).  Her PVC's have been managed by [name] and she takes propafenone at home.     In 2004, she had an episode of c/p and underwent catheterization, which reportedly showed nonobstructive CAD.  She says that at that time, she was very anxious and she underwent further treatment for her anxiety and depression with resolution of chest pain.  She has since, not experienced chest pain until earlier this week, when she developed 7/10 left chest pressure and heaviness without associated symptoms.  This was constant over the course of a few days and not worsened with exertion, deep breathing, position changes, or palpation.  Because of persistent Ss, she asked a neighbor, who is an EMT, to check her BP and he recommended that they call EMS.  EMS subsequently took her to the [facility] ED.  Here, ECG showed ventricular bigeminy w/o acute ST/T changes and cardiac markers have been negative.  She did have pain relief following presentation to the ED but about 30 mins ago, mild left sided chest pressure returned and persists.  She is able to participate in this interview w/o any distress.  She feels that her Ss are most likely related to anxiety and depression and would like that to be addressed.  Inpatient Medications  . amLODipine  5 mg Oral q morning - 10a  . cholecalciferol  2,000 Units Oral Daily  . enoxaparin (LOVENOX) injection  40 mg Subcutaneous Q24H  . multivitamin-lutein  1 capsule Oral Daily  . sodium chloride  3 mL Intravenous Q12H  . sodium chloride  3 mL Intravenous Q12H   Family History Family History  Problem Relation Age of Onset  . Stroke Father     cva after cea in his 18's, died @ 69.  . Other Mother     died @ 26 - old age.    Social History History   Social History  . Marital Status: Widowed    Spouse Name: N/A    Number of Children: N/A  . Years of Education: N/A   Occupational History  . Retired    Social History Main Topics  . Smoking status: Never Smoker   . Smokeless tobacco: Not on file  . Alcohol Use: No  . Drug Use: No  . Sexual Activity: No    Other Topics Concern  . Not on file   Social History Narrative   Lives in [place].  She does not routinely exercise.    Review of Systems  General:  No chills, fever, night sweats or weight changes.  Cardiovascular:  +++ chest pain, without dyspnea on exertion, edema, orthopnea, palpitations, paroxysmal nocturnal dyspnea. Dermatological: No rash, lesions/masses Respiratory: No cough, dyspnea Urologic: No hematuria, dysuria Abdominal:   +++ constipation.  No nausea, vomiting, diarrhea, bright red blood per rectum, melena, or hematemesis Neurologic:  No visual changes, wkns, changes in mental status. All other systems reviewed and are otherwise negative except as noted above.  Physical Exam  Blood pressure 145/91, pulse 78, temperature 98.5 F (36.9 C), temperature source Oral, resp. rate 20, height 5\' 6"  (1.676 m), weight 153 lb (69.4 kg), SpO2 95.00%.  General: Pleasant, NAD Psych: Normal affect. Neuro: Alert and oriented X 3. Moves all extremities spontaneously. HEENT: Normal  Neck: Supple without bruits or JVD. Lungs:  Resp regular and unlabored, CTA. Heart: Irregular no s3, s4, 2/6 systolic murmur, heard throughout, loudest @ the apex. Abdomen: Soft, non-tender, non-distended, BS + x 4.  Extremities: No clubbing, cyanosis or edema. DP/PT/Radials 2+ and equal bilaterally.  Labs   Recent Labs  09/06/13 1323 09/06/13 2110 09/07/13 0310  TROPONINI <0.30 <0.30 <0.30   Lab Results  Component Value Date   WBC 4.7 09/07/2013   HGB 11.6* 09/07/2013   HCT 33.8* 09/07/2013   MCV 90.6 09/07/2013   PLT 172 09/07/2013     Recent Labs Lab 09/07/13 0310  NA 136  K 4.2  CL 101  CO2 26  BUN 16  CREATININE 0.89  CALCIUM 8.9  PROT 6.1  BILITOT 0.4  ALKPHOS 47  ALT 15  AST 24  GLUCOSE 92   Radiology/Studies  Dg Chest 2 View  09/06/2013   CLINICAL DATA:  Chest pain.  EXAM: CHEST  2 VIEW  COMPARISON:  None.  FINDINGS: Dual lead pacer noted with lead tips  projecting over the right atrium and ventricle, respectively. Mild cardiomegaly.  Old granulomatous disease noted with calcified bilateral hilar lymph nodes and calcified mediastinal nodes. No pleural effusion or airspace opacity. Mild thoracic spondylosis.  IMPRESSION: 1. Cardiomegaly, without edema. 2. Old granulomatous disease.   Electronically Signed   By: Herbie Baltimore M.D.   On: 09/06/2013 09:58   ECG  Rsr, 80, ventricular bigeminy, lad, lafb, poor r prog - no acute st/t changes.  ASSESSMENT AND PLAN  1.  Chest pain:  Pt presented with a several day h/o constant chest pain.  Despite prolonged Ss, CE  are negative and ECG is non-acute.  She has had recurrent chest pain this afternoon, though is in no acute distress.  Chest pain is not worse with palpation, deep breathing, or position changes, and she has no associated Ss.  She has a reported h/o nonobstructive cath in 2004 and ongoing risk factors including HTN/HL.  We will arrange for an inpatient lexiscan cardiolite for tomorrow to r/o ischemia as a possible cause of her chest pain.  Add asa and check lipids.  2.  HTN:  BP variable.  Follow.  3.  HL:  Check fasting lipids in the AM.  4.  Systolic murmur:  Will check echo.  5.  Depression:  Prev treated with ECT - does not appear to be on any antidepressant therapy at home.  She is requesting treatment.  Given significant hx, may benefit from psychiatry eval and outpt f/u.  Signed, Nicolasa Ducking, NP 09/07/2013, 1:12 PM Patient seen, history reviewed, labs and EKG reviewed.  Her chest pain is atypical and may be related to her chronic problems with anxiety and depression. We will check echo re her basilar systolic murmur and will arrange for lexiscan myoview for tomorrow. Agree with assessment and plan as noted above. Psychiatric factors appear to be prominent here.

## 2013-09-07 NOTE — Progress Notes (Signed)
UR completed 

## 2013-09-07 NOTE — Progress Notes (Addendum)
[facility] PROGRESS NOTE    Barbara Marks ZOX:[id number] DOB: 10-15-1933 DOA: 09/06/2013 PCP: [name], MD  HPI/Brief narrative 79 year old female patient, cardiac cath in 2004 with nonobstructive CAD, pacemaker placed for bradycardia, chronic PVCs, significant long-standing history of depression and anxiety requiring ECT treatment-last time in 2008, kidney stones treated with lithotripsy, chronic back pain treated by chiropractor, hyperlipidemia, presented to ED with left-sided chest pain of 2 days duration, radiating to left upper extremity, no associated dyspnea, rated at 6/10 in severity and sharp. In the ED, EKG showed PVCs, QT prolongation. Troponins negative. Admitted for further evaluation. Cardiology consulted on 12/4 for further assistance with ischemic workup  Assessment/Plan:  Chest pain suspicious for angina/history of nonobstructive CAD - Troponins x3 negative. Still having intermittent chest pain. - Continue sublingual NTG. - Cardiology consulted for further evaluation.  Hypokalemia - Corrected. Magnesium: 2  Prolonged QT - Improved from 515 >486  Anemia - ? Chronic - Stable  Hypertension - Mildly uncontrolled. Continue amlodipine  Chronic back/RUQ pain - Status post cholecystectomy. Patient states that this pain seems to radiate from the back and had improved after treatment with a chiropractor. Denies nausea, vomiting or diarrhea. - LFTs & lipase normal and no leukocytosis - Check RUQ ultrasound  History of anxiety and depression - Prior treatment with ECTs. - Patient requests and will add low-dose when necessary Ativan for anxiety  History of bradycardia, status post pacemaker - Sinus rhythm with PVCs on telemetry.  History of hyperlipidemia  History of renal stones   DVT prophylaxis: Lovenox Code Status: Full Family Communication: None Disposition Plan: Home when medically  stable   Consultants:  Cardiology-pending  Procedures:  None  Antibiotics:  None   Subjective: Intermittent left-sided chest pain. No dyspnea, nausea or vomiting. Chronic intermittent right upper quadrant pain  Objective: Filed Vitals:   09/06/13 1330 09/06/13 1405 09/06/13 1937 09/07/13 0544  BP: 140/81 164/88 151/75 145/91  Pulse: 72 82 58 78  Temp:  99 F (37.2 C) 98.5 F (36.9 C) 98.5 F (36.9 C)  TempSrc:  Oral Oral Oral  Resp: 18 20 19 20   Height:      Weight:      SpO2: 100% 94% 100% 95%    Intake/Output Summary (Last 24 hours) at 09/07/13 1142 Last data filed at 09/06/13 2307  Gross per 24 hour  Intake      0 ml  Output    900 ml  Net   -900 ml   Filed Weights   09/06/13 0917  Weight: 69.4 kg (153 lb)     Exam:  General exam: Pleasant elderly female lying comfortably in bed Respiratory system: Clear. No increased work of breathing. Cardiovascular system: S1 & S2 heard, RRR. No JVD, murmurs, gallops, clicks or pedal edema. Telemetry: Sinus rhythm PVCs Gastrointestinal system: Abdomen is nondistended, soft and nontender. Normal bowel sounds heard. Central nervous system: Alert and oriented. No focal neurological deficits. Extremities: Symmetric 5 x 5 power.   Data Reviewed: Basic Metabolic Panel:  Recent Labs Lab 09/06/13 0931 09/07/13 0310  NA 140 136  K 3.3* 4.2  CL 101 101  CO2 26 26  GLUCOSE 103* 92  BUN 14 16  CREATININE 0.75 0.89  CALCIUM 9.4 8.9  MG  --  2.0   Liver Function Tests:  Recent Labs Lab 09/06/13 0931 09/07/13 0310  AST 20 24  ALT 12 15  ALKPHOS 52 47  BILITOT 0.6 0.4  PROT 6.6 6.1  ALBUMIN 3.5 3.1*  Recent Labs Lab 09/06/13 0931  LIPASE 31   No results found for this basename: AMMONIA,  in the last 168 hours CBC:  Recent Labs Lab 09/06/13 0931 09/07/13 0310  WBC 6.0 4.7  HGB 11.1* 11.6*  HCT 37.1 33.8*  MCV 92.1 90.6  PLT 155 172   Cardiac Enzymes:  Recent Labs Lab 09/06/13 1323  09/06/13 2110 09/07/13 0310  TROPONINI <0.30 <0.30 <0.30   BNP (last 3 results) No results found for this basename: PROBNP,  in the last 8760 hours CBG: No results found for this basename: GLUCAP,  in the last 168 hours  No results found for this or any previous visit (from the past 240 hour(s)).    Additional labs: 1. EKG: Sinus rhythm, frequent PVCs, left anterior fascicular block and QTC of 486 ms     Studies: Dg Chest 2 View  09/06/2013   CLINICAL DATA:  Chest pain.  EXAM: CHEST  2 VIEW  COMPARISON:  None.  FINDINGS: Dual lead pacer noted with lead tips projecting over the right atrium and ventricle, respectively. Mild cardiomegaly.  Old granulomatous disease noted with calcified bilateral hilar lymph nodes and calcified mediastinal nodes. No pleural effusion or airspace opacity. Mild thoracic spondylosis.  IMPRESSION: 1. Cardiomegaly, without edema. 2. Old granulomatous disease.   Electronically Signed   By: Herbie Baltimore M.D.   On: 09/06/2013 09:58        Scheduled Meds: . amLODipine  5 mg Oral q morning - 10a  . cholecalciferol  2,000 Units Oral Daily  . enoxaparin (LOVENOX) injection  40 mg Subcutaneous Q24H  . multivitamin-lutein  1 capsule Oral Daily  . sodium chloride  3 mL Intravenous Q12H  . sodium chloride  3 mL Intravenous Q12H   Continuous Infusions:   Active Problems:   Chest pain    Time spent: 35 minutes    Christophor Eick, MD, FACP, FHM. [facility] Pager [phone]  If 7PM-7AM, please contact night-coverage [email or web address] Password [email or web address] 09/07/2013, 11:42 AM    LOS: 1 day

## 2013-09-07 NOTE — Progress Notes (Signed)
*  PRELIMINARY RESULTS* Echocardiogram 2D Echocardiogram has been performed.  Barbara Marks 09/07/2013, 3:55 PM

## 2013-09-08 ENCOUNTER — Ambulatory Visit (HOSPITAL_COMMUNITY): Payer: Medicare Other

## 2013-09-08 ENCOUNTER — Encounter (HOSPITAL_COMMUNITY): Payer: Self-pay | Admitting: Physician Assistant

## 2013-09-08 ENCOUNTER — Observation Stay (HOSPITAL_COMMUNITY): Payer: Medicare Other

## 2013-09-08 DIAGNOSIS — R079 Chest pain, unspecified: Secondary | ICD-10-CM

## 2013-09-08 MED ORDER — TECHNETIUM TC 99M SESTAMIBI GENERIC - CARDIOLITE
10.0000 | Freq: Once | INTRAVENOUS | Status: AC | PRN
  Administered 2013-09-08: 10 via INTRAVENOUS

## 2013-09-08 MED ORDER — ESCITALOPRAM OXALATE 10 MG PO TABS
10.0000 mg | ORAL_TABLET | Freq: Every day | ORAL | Status: DC
  Filled 2013-09-08: qty 1

## 2013-09-08 MED ORDER — REGADENOSON 0.4 MG/5ML IV SOLN
INTRAVENOUS | Status: AC
  Administered 2013-09-08: 0.4 mg via INTRAVENOUS
  Filled 2013-09-08: qty 5

## 2013-09-08 MED ORDER — LORAZEPAM 0.5 MG PO TABS: 0.5000 mg | ORAL_TABLET | Freq: Three times a day (TID) | ORAL | Status: DC | PRN

## 2013-09-08 MED ORDER — REGADENOSON 0.4 MG/5ML IV SOLN
INTRAVENOUS | Status: AC
  Filled 2013-09-08: qty 5

## 2013-09-08 MED ORDER — ESCITALOPRAM OXALATE 10 MG PO TABS: 10.0000 mg | ORAL_TABLET | Freq: Every day | ORAL | Status: DC

## 2013-09-08 NOTE — Progress Notes (Signed)
Lexiscan cardiolite completed. [name] to follow. Dayna Dunn PA-C

## 2013-09-08 NOTE — Discharge Summary (Signed)
Physician Discharge Summary  Patient ID: Barbara Marks MRN: [id number] DOB/AGE: 02-22-1934 79 y.o.  Admit date: 09/06/2013 Discharge date: 09/08/2013  Primary Care Physician:  [name], MD  Discharge Diagnoses:    . Chest pain, most likely anxiety attack  . HYPERTENSION, UNSPECIFIED . Hypokalemia . Prolonged QT interval . Anemia . DEPRESSION  Consults: Cardiology   Recommendations for Outpatient Follow-up:  Patient was admitted to followup with Dr. Christella Hartigan on Monday, 09/11/2013, appointment was made She was also recommended to followup next week with her psychiatrist Dr.Caypak at [facility]  Allergies:   Allergies  Allergen Reactions  . Codeine     unknown  . Simvastatin     unknown     Discharge Medications:   Medication List         amLODipine 5 MG tablet  Commonly known as:  NORVASC  Take 5 mg by mouth every morning.     cholecalciferol 1000 UNITS tablet  Commonly known as:  VITAMIN D  Take 2,000 Units by mouth daily.     COQ10 150 MG Caps  Take 1 capsule by mouth daily.     escitalopram 10 MG tablet  Commonly known as:  LEXAPRO  Take 1 tablet (10 mg total) by mouth daily.     Glucosamine-Chondroitin Caps  Take 1 capsule by mouth daily.     LORazepam 0.5 MG tablet  Commonly known as:  ATIVAN  Take 1 tablet (0.5 mg total) by mouth every 8 (eight) hours as needed for anxiety.     multivitamin tablet  Take 1 tablet by mouth daily.     multivitamin-lutein Caps capsule  Take 1 capsule by mouth daily.     polyethylene glycol packet  Commonly known as:  MIRALAX / GLYCOLAX  Take 17 g by mouth as needed for mild constipation.     propafenone 225 MG 12 hr capsule  Commonly known as:  RYTHMOL SR  Take 225 mg by mouth daily as needed (if feels like heart rhythm is irregular).     VITAMIN B COMPLEX-C Caps  Take 1 capsule by mouth daily.         Brief H and P: For complete details please refer to admission H and P, but in brief patient is  79 year old female with history of PVCs on Rythmol, depression, getting ECT treatments, presented with left-sided chest wall pain radiating to left arm. Patient was admitted for further workup  Hospital Course:   Chest pain: Resolved, patient also feels that it could have been tested due to acute anxiety attack. Cardiac enzymes were negative  - EKG showed no acute ST-T wave changes chest of ischemia  - 2-D echo was done Which showed EF of 55%, grade 1 diastolic dysfunction, no regional wall motion abnormalities  - stress Myoview test showed No definite inducible or reversible ischemia with pharmacologic stress. No fixed defects. Mild inferior and septal hypokinesis, EF 41%.  - Discussed in detail with Dr. Patty Sermons and Dr. Gala Romney who cleared her from cardiac standpoint   DEPRESSION WITH ANXIETY  - Placed on Lexapro 10 mg daily, continue Ativan as needed  - Patient has appointment with psychiatrist next week at [facility]   HYPERTENSION, UNSPECIFIED: Currently stable   Mild right upper quadrant abdominal pain improved, intermittent - Abdominal pain has improved, abdominal ultrasound shows dilated common bile duct obscured distally stone vs versus mass. Discussed with GI, Jennye Moccasin, PA patient had EUS done in September 2014 had shown multiple cyst through the pancreas otherwise normal. Followup  appointment for patient with gastroenterology on Monday 12/8 was made.      Day of Discharge BP 152/66  Pulse 57  Temp(Src) 98.4 F (36.9 C) (Oral)  Resp 17  Ht 5\' 6"  (1.676 m)  Wt 69.4 kg (153 lb)  BMI 24.71 kg/m2  SpO2 99%  Physical Exam: General: Alert and awake oriented x3 not in any acute distress. HEENT: anicteric sclera, pupils reactive to light and accommodation CVS: S1-S2 clear no murmur rubs or gallops Chest: clear to auscultation bilaterally, no wheezing rales or rhonchi Abdomen: soft nontender, nondistended, normal bowel sounds, no organomegaly Extremities: no cyanosis,  clubbing or edema noted bilaterally   The results of significant diagnostics from this hospitalization (including imaging, microbiology, ancillary and laboratory) are listed below for reference.    LAB RESULTS: Basic Metabolic Panel:  Recent Labs Lab 09/06/13 0931 09/07/13 0310  NA 140 136  K 3.3* 4.2  CL 101 101  CO2 26 26  GLUCOSE 103* 92  BUN 14 16  CREATININE 0.75 0.89  CALCIUM 9.4 8.9  MG  --  2.0   Liver Function Tests:  Recent Labs Lab 09/06/13 0931 09/07/13 0310  AST 20 24  ALT 12 15  ALKPHOS 52 47  BILITOT 0.6 0.4  PROT 6.6 6.1  ALBUMIN 3.5 3.1*    Recent Labs Lab 09/06/13 0931  LIPASE 31   No results found for this basename: AMMONIA,  in the last 168 hours CBC:  Recent Labs Lab 09/06/13 0931 09/07/13 0310  WBC 6.0 4.7  HGB 11.1* 11.6*  HCT 37.1 33.8*  MCV 92.1 90.6  PLT 155 172   Cardiac Enzymes:  Recent Labs Lab 09/06/13 2110 09/07/13 0310  TROPONINI <0.30 <0.30   BNP: No components found with this basename: POCBNP,  CBG: No results found for this basename: GLUCAP,  in the last 168 hours  Significant Diagnostic Studies:  Dg Chest 2 View  09/06/2013   CLINICAL DATA:  Chest pain.  EXAM: CHEST  2 VIEW  COMPARISON:  None.  FINDINGS: Dual lead pacer noted with lead tips projecting over the right atrium and ventricle, respectively. Mild cardiomegaly.  Old granulomatous disease noted with calcified bilateral hilar lymph nodes and calcified mediastinal nodes. No pleural effusion or airspace opacity. Mild thoracic spondylosis.  IMPRESSION: 1. Cardiomegaly, without edema. 2. Old granulomatous disease.   Electronically Signed   By: Herbie Baltimore M.D.   On: 09/06/2013 09:58   US Abdomen Complete  09/08/2013   CLINICAL DATA:  Right upper quadrant abdominal pain. Previous cholecystectomy. Images submitted for interpretation on 09/08/2013.  EXAM: ULTRASOUND ABDOMEN COMPLETE  COMPARISON:  None.  FINDINGS: Gallbladder:  Surgically absent.  Common  bile duct:  Diameter: Dilated, measuring 12.9 mm in diameter proximally. The distal portions are obscured by overlying bowel gas. No visible stone.  Liver:  No focal lesion identified. Within normal limits in parenchymal echogenicity. No intrahepatic ductal dilatation seen.  IVC:  No abnormality visualized.  Pancreas:  Visualized portion unremarkable.  Spleen:  Size and appearance within normal limits.  Right Kidney:  Length: 11.0 cm. Diffuse cortical thinning and prominent renal sinus fat. Normal echotexture.  Left Kidney:  Length: 11.1 cm. Diffuse cortical thinning and prominent renal sinus fat. Normal echotexture. 1.1 cyst in the upper pole and 0.9 cm cyst in the lower pole.  Abdominal aorta:  Atheromatous changes without aneurysm.  Other findings:  None.  IMPRESSION: 1. Dilated common duct, obscured distally. This raises the possibility of a nonvisualized distal common duct  stone or mass. 2. Status post cholecystectomy. 3. Diffuse bilateral renal cortical atrophy.   Electronically Signed   By: Gordan Payment M.D.   On: 09/08/2013 16:13    2D ECHO:   Disposition and Follow-up:      Future Appointments Provider Department Dept Phone   09/11/2013 2:30 PM Meredith Pel, NP [facility] [phone]   10/18/2013 9:15 AM [facility] [employer] [phone]       DISPOSITION: Home DIET: Heart healthy diet   DISCHARGE FOLLOW-UP Follow-up Information   Follow up with BRONSTEIN,DAVID, MD. Schedule an appointment as soon as possible for a visit in 2 weeks.   Specialty:  Family Medicine   Contact information:   [address] [place] [address] [phone]       Follow up with Rachael Fee, MD On 09/11/2013. (at 2:30PM. Appointment is with Lucrezia Europe, [facility])    Specialty:  Gastroenterology   Contact information:   [address] [place] [address] [phone]       Time spent on Discharge: 35  mins  Signed:   RAI,RIPUDEEP M.D. [facility] 09/08/2013, 5:36 PM Pager: [phone]

## 2013-09-08 NOTE — Progress Notes (Addendum)
Patient: Barbara Marks / Admit Date: 09/06/2013 / Date of Encounter: 09/08/2013, 4:21 PM   Subjective  Slept well last night. Has had some L arm pain. No further CP.  Objective   Telemetry: while down in nuc, pt exhibited NSR with ventricular bigeminy  Physical Exam: Blood pressure 152/66, pulse 57, temperature 98.4 F (36.9 C), temperature source Oral, resp. rate 17, height 5\' 6"  (1.676 m), weight 153 lb (69.4 kg), SpO2 99.00%. General: Well developed, well nourished WF, in no acute distress. Laying flat in bed Head: Normocephalic, atraumatic, sclera non-icteric, no xanthomas, nares are without discharge. Neck: JVP not elevated. Lungs: Clear bilaterally to auscultation without wheezes, rales, or rhonchi. Breathing is unlabored. Heart: RRR S1 S2 soft SEM no rubs or gallops.  Abdomen: Soft, non-tender, non-distended with normoactive bowel sounds. No rebound/guarding. Extremities: No clubbing or cyanosis. No edema. Distal pedal pulses are 2+ and equal bilaterally. Neuro: Alert and oriented X 3. Moves all extremities spontaneously. Psych:  Responds to questions appropriately with a normal affect.   Intake/Output Summary (Last 24 hours) at 09/08/13 1621 Last data filed at 09/07/13 2155  Gross per 24 hour  Intake      0 ml  Output    600 ml  Net   -600 ml    Inpatient Medications:  . amLODipine  5 mg Oral q morning - 10a  . cholecalciferol  2,000 Units Oral Daily  . enoxaparin (LOVENOX) injection  40 mg Subcutaneous Q24H  . escitalopram  10 mg Oral Daily  . multivitamin-lutein  1 capsule Oral Daily  . sodium chloride  3 mL Intravenous Q12H  . sodium chloride  3 mL Intravenous Q12H   Infusions:    Labs:  Recent Labs  09/06/13 0931 09/07/13 0310  NA 140 136  K 3.3* 4.2  CL 101 101  CO2 26 26  GLUCOSE 103* 92  BUN 14 16  CREATININE 0.75 0.89  CALCIUM 9.4 8.9  MG  --  2.0    Recent Labs  09/06/13 0931 09/07/13 0310  AST 20 24  ALT 12 15  ALKPHOS 52 47  BILITOT  0.6 0.4  PROT 6.6 6.1  ALBUMIN 3.5 3.1*    Recent Labs  09/06/13 0931 09/07/13 0310  WBC 6.0 4.7  HGB 11.1* 11.6*  HCT 37.1 33.8*  MCV 92.1 90.6  PLT 155 172    Recent Labs  09/06/13 1323 09/06/13 2110 09/07/13 0310  TROPONINI <0.30 <0.30 <0.30   Radiology/Studies:  Dg Chest 2 View 09/06/2013   CLINICAL DATA:  Chest pain.  EXAM: CHEST  2 VIEW  COMPARISON:  None.  FINDINGS: Dual lead pacer noted with lead tips projecting over the right atrium and ventricle, respectively. Mild cardiomegaly.  Old granulomatous disease noted with calcified bilateral hilar lymph nodes and calcified mediastinal nodes. No pleural effusion or airspace opacity. Mild thoracic spondylosis.  IMPRESSION: 1. Cardiomegaly, without edema. 2. Old granulomatous disease.   Electronically Signed   By: Herbie Baltimore M.D.   On: 09/06/2013 09:58   US Abdomen Complete 09/08/2013   CLINICAL DATA:  Right upper quadrant abdominal pain. Previous cholecystectomy. Images submitted for interpretation on 09/08/2013.  EXAM: ULTRASOUND ABDOMEN COMPLETE  COMPARISON:  None.  FINDINGS: Gallbladder:  Surgically absent.  Common bile duct:  Diameter: Dilated, measuring 12.9 mm in diameter proximally. The distal portions are obscured by overlying bowel gas. No visible stone.  Liver:  No focal lesion identified. Within normal limits in parenchymal echogenicity. No intrahepatic ductal dilatation seen.  IVC:  No abnormality visualized.  Pancreas:  Visualized portion unremarkable.  Spleen:  Size and appearance within normal limits.  Right Kidney:  Length: 11.0 cm. Diffuse cortical thinning and prominent renal sinus fat. Normal echotexture.  Left Kidney:  Length: 11.1 cm. Diffuse cortical thinning and prominent renal sinus fat. Normal echotexture. 1.1 cyst in the upper pole and 0.9 cm cyst in the lower pole.  Abdominal aorta:  Atheromatous changes without aneurysm.  Other findings:  None.  IMPRESSION: 1. Dilated common duct, obscured distally. This  raises the possibility of a nonvisualized distal common duct stone or mass. 2. Status post cholecystectomy. 3. Diffuse bilateral renal cortical atrophy.   Electronically Signed   By: Gordan Payment M.D.   On: 09/08/2013 16:13    Assessment and Plan  1. Chest pain - several day history of this. Nuc results pending. Troponins negative. H/o nonobstructive CAD in 2004. Note abd [place] results with dilated common bile duct obscured distally question stone vs mass - further per IM. 2. HTN - continue Norvasc. BP slightly elevated during nuc but was otherwise acceptable this AM. 3. Hyperlipidemia - can add lipid panel to AM if patient still in hospital. Otherwise f/u PCP. 4. Systolic murmur - no significant valvular disease identified. 5. Depression - prev treated with ECT - does not appear to be on any antidepressant therapy at home. Per yesterday's note, she is requesting treatment. Given significant hx, may benefit from psychiatry eval and outpt f/u. Will defer to IM. 6. Ventricular bigeminy - known to patient. Lytes OK. Takes propafenone PRN for this. Not sure why she is not on BB but may have something to do with her depression. 7. Bradycardia s/p pacemaker - f/u [name] as outpatient.  Signed, Ronie Spies PA-C   Patient seen and examined with Ronie Spies, PA-C. We discussed all aspects of the encounter. I agree with the assessment and plan as stated above.   Myoview images reviewed personally. No scar or ischemia. EF 41%. Echo also reviewed. EF 55%. Suspect EF on Myoview falsely low due to PVCs. Her CP has resolved. Doubt CP is cardiac. No further cardiac w/u at this point. We will sign off.   Truman Hayward 5:46 PM

## 2013-09-08 NOTE — Progress Notes (Signed)
       Patient Name: Barbara Marks    Pt not on floor on rounds Doing myoview scan  To be discharged if scan is normal  Signed, Sherryl Manges MD  09/08/2013

## 2013-09-08 NOTE — Progress Notes (Signed)
Pt discharged to home per MD order. Pt received and reviewed all discharge instructions and medication information including follow-up appointments and prescriptions. Pt verbalized understanding. Pt alert and oriented at discharge with no complaints of pain.  IV site removed prior to discharge with no complications. Joylene Grapes

## 2013-09-11 ENCOUNTER — Ambulatory Visit: Payer: Medicare Other | Admitting: Nurse Practitioner

## 2013-10-05 ENCOUNTER — Ambulatory Visit: Payer: Self-pay | Admitting: Psychiatry

## 2013-10-18 ENCOUNTER — Encounter: Payer: Medicare Other | Admitting: *Deleted

## 2013-10-23 ENCOUNTER — Encounter: Payer: Self-pay | Admitting: *Deleted

## 2013-10-27 ENCOUNTER — Ambulatory Visit: Payer: Medicare Other | Admitting: Gastroenterology

## 2013-11-06 ENCOUNTER — Ambulatory Visit: Payer: Self-pay | Admitting: Psychiatry

## 2013-12-03 ENCOUNTER — Ambulatory Visit: Payer: Self-pay | Admitting: Psychiatry

## 2013-12-26 ENCOUNTER — Ambulatory Visit (INDEPENDENT_AMBULATORY_CARE_PROVIDER_SITE_OTHER): Payer: Medicare Other | Admitting: Cardiovascular Disease

## 2013-12-26 ENCOUNTER — Encounter: Payer: Self-pay | Admitting: Cardiovascular Disease

## 2013-12-26 VITALS — BP 120/80 | HR 93 | Ht 66.0 in | Wt 154.8 lb

## 2013-12-26 DIAGNOSIS — I4949 Other premature depolarization: Secondary | ICD-10-CM

## 2013-12-26 DIAGNOSIS — E785 Hyperlipidemia, unspecified: Secondary | ICD-10-CM

## 2013-12-26 DIAGNOSIS — F329 Major depressive disorder, single episode, unspecified: Secondary | ICD-10-CM

## 2013-12-26 DIAGNOSIS — F3289 Other specified depressive episodes: Secondary | ICD-10-CM

## 2013-12-26 DIAGNOSIS — I2510 Atherosclerotic heart disease of native coronary artery without angina pectoris: Secondary | ICD-10-CM

## 2013-12-26 DIAGNOSIS — I10 Essential (primary) hypertension: Secondary | ICD-10-CM

## 2013-12-26 DIAGNOSIS — R079 Chest pain, unspecified: Secondary | ICD-10-CM

## 2013-12-26 DIAGNOSIS — Z950 Presence of cardiac pacemaker: Secondary | ICD-10-CM

## 2013-12-26 NOTE — Assessment & Plan Note (Signed)
Followed by Dr. Klein 

## 2013-12-26 NOTE — Progress Notes (Signed)
Patient ID: Barbara Marks, female    DOB: 12-05-1933, 80 y.o.   MRN: [id number]  HPI Comments: Barbara Marks is a 80 year old woman,   cardiac cath in 2004 with nonobstructive CAD, pacemaker placed for bradycardia, chronic PVCs, profound significant long history of depression and anxiety requiring ECT treatment dating back to the 80s, history of kidney stones with lithotripsy, who presents for routine followup.  She reports that overall she is doing well.  Previous diagnosis of diverticuli, DJD. She has had chronic abdominal pain. Found to have "polyps on her pancreas". Uncertain if she means a cyst. She has been seeing GI at [facility].  Notes indicate recent admission to  for chest pain. She had normal echocardiogram and stress test These were reviewed with her  In terms of her cardiac issues, she denies any significant chest discomfort or shortness of breath.  She reports that she continues to take red yeast rice.  Depression has been a worsening issue through the winter. She also reports having eye problems with macular degeneration, corneal problems, cataract  previously referred to Dr. Caryl Comes for her symptomatic PVCs. started on Rythmol b.i.d.,  improvement of her symptoms. She continued to have PVCs and was asymptomatic. She stopped the Rythmol on her own She did not have any side effects on the Rythmol   Long history of  ECT treatments every 4-5 weeks.  She was followed by Dr. Thurmond Butts.  She reports that she is ECT treatment plan tomorrow She reports having some memory loss and wonders if this could be from ECT treatment   Normal sinus rhythm with rate 93 beats per minute with PVCs, otherwise normal-appearing EKG  Outpatient Encounter Prescriptions as of 12/26/2013  Medication Sig  . amLODipine (NORVASC) 5 MG tablet Take 5 mg by mouth every morning.   . cholecalciferol (VITAMIN D) 1000 UNITS tablet Take 2,000 Units by mouth daily.    . Coenzyme Q10 (COQ10) 150 MG CAPS Take 1  capsule by mouth daily.    [name] escitalopram (LEXAPRO) 10 MG tablet Take 1 tablet (10 mg total) by mouth daily.  . Glucosamine-Chondroit-Vit C-Mn (GLUCOSAMINE-CHONDROITIN) CAPS Take 1 capsule by mouth daily.   [name] LORazepam (ATIVAN) 0.5 MG tablet Take 1 tablet (0.5 mg total) by mouth every 8 (eight) hours as needed for anxiety.  . Multiple Vitamin (MULTIVITAMIN) tablet Take 1 tablet by mouth daily.    . multivitamin-lutein (OCUVITE-LUTEIN) CAPS capsule Take 1 capsule by mouth daily.  . polyethylene glycol (MIRALAX / GLYCOLAX) packet Take 17 g by mouth as needed for mild constipation.   . propafenone (RYTHMOL SR) 225 MG 12 hr capsule Take 225 mg by mouth daily as needed (if feels like heart rhythm is irregular).  . Vitamin B Complex-C CAPS Take 1 capsule by mouth daily.        Review of Systems  Constitutional: Negative.   HENT: Negative.   Eyes: Negative.   Respiratory: Negative.   Cardiovascular: Negative.   Gastrointestinal: Negative.        Right lower quadrant discomfort  Endocrine: Negative.   Musculoskeletal: Negative.   Skin: Negative.   Allergic/Immunologic: Negative.   Neurological: Negative.   Hematological: Negative.   Psychiatric/Behavioral: Positive for dysphoric mood.  All other systems reviewed and are negative.   BP 120/80  Pulse 93  Ht 5\' 6"  (1.676 m)  Wt 154 lb 12 oz (70.194 kg)  BMI 24.99 kg/m2  Physical Exam  Nursing note and vitals reviewed. Constitutional: She is oriented to person, place,  and time. She appears well-developed and well-nourished.  HENT:  Head: Normocephalic.  Nose: Nose normal.  Mouth/Throat: Oropharynx is clear and moist.  Eyes: Conjunctivae are normal. Pupils are equal, round, and reactive to light.  Neck: Normal range of motion. Neck supple. No JVD present.  Cardiovascular: Normal rate, regular rhythm, S1 normal, S2 normal, normal heart sounds and intact distal pulses.  Exam reveals no gallop and no friction rub.   No murmur  heard. Pulmonary/Chest: Effort normal and breath sounds normal. No respiratory distress. She has no wheezes. She has no rales. She exhibits no tenderness.  Abdominal: Soft. Bowel sounds are normal. She exhibits no distension. There is no tenderness.  Musculoskeletal: Normal range of motion. She exhibits no edema and no tenderness.  Lymphadenopathy:    She has no cervical adenopathy.  Neurological: She is alert and oriented to person, place, and time. Coordination normal.  Skin: Skin is warm and dry. No rash noted. No erythema.  Psychiatric: She has a normal mood and affect. Her behavior is normal. Judgment and thought content normal.    Assessment and Plan

## 2013-12-26 NOTE — Assessment & Plan Note (Signed)
Blood pressure is well controlled on today's visit. No changes made to the medications. 

## 2013-12-26 NOTE — Assessment & Plan Note (Signed)
This is her major issue. ECT treatment planned for tomorrow

## 2013-12-26 NOTE — Assessment & Plan Note (Signed)
She has taken herself off Rythmol. She reports that she is asymptomatic. We have suggested that she call her office if symptoms recur

## 2013-12-26 NOTE — Assessment & Plan Note (Signed)
Currently with no symptoms of angina. No further workup at this time. Continue current medication regimen. 

## 2013-12-26 NOTE — Assessment & Plan Note (Signed)
No recent lipid panel available. No significant CAD. Will defer to primary care

## 2013-12-26 NOTE — Patient Instructions (Addendum)
You are doing well. No medication changes were made.  Please call for palpitations  Please call us if you have new issues that need to be addressed before your next appt.  Your physician wants you to follow-up in: 12 months.  You will receive a reminder letter in the mail two months in advance. If you don't receive a letter, please call our office to schedule the follow-up appointment.

## 2014-01-03 ENCOUNTER — Ambulatory Visit: Payer: Self-pay | Admitting: Psychiatry

## 2014-01-18 ENCOUNTER — Encounter: Payer: Self-pay | Admitting: *Deleted

## 2014-03-06 ENCOUNTER — Encounter: Payer: Self-pay | Admitting: Cardiology

## 2014-03-28 ENCOUNTER — Encounter: Payer: Self-pay | Admitting: *Deleted

## 2014-04-09 ENCOUNTER — Encounter: Payer: Self-pay | Admitting: Internal Medicine

## 2014-05-15 ENCOUNTER — Encounter: Payer: Self-pay | Admitting: *Deleted

## 2014-05-21 DIAGNOSIS — F32A Depression, unspecified: Secondary | ICD-10-CM | POA: Insufficient documentation

## 2014-05-21 DIAGNOSIS — I10 Essential (primary) hypertension: Secondary | ICD-10-CM | POA: Insufficient documentation

## 2014-05-21 DIAGNOSIS — F329 Major depressive disorder, single episode, unspecified: Secondary | ICD-10-CM | POA: Insufficient documentation

## 2014-07-31 ENCOUNTER — Encounter: Payer: Self-pay | Admitting: Internal Medicine

## 2014-07-31 ENCOUNTER — Ambulatory Visit (INDEPENDENT_AMBULATORY_CARE_PROVIDER_SITE_OTHER): Payer: Medicare Other | Admitting: Internal Medicine

## 2014-07-31 VITALS — BP 110/60 | HR 83 | Ht 66.0 in | Wt 154.0 lb

## 2014-07-31 DIAGNOSIS — I2510 Atherosclerotic heart disease of native coronary artery without angina pectoris: Secondary | ICD-10-CM

## 2014-07-31 DIAGNOSIS — I4581 Long QT syndrome: Secondary | ICD-10-CM

## 2014-07-31 DIAGNOSIS — Z45018 Encounter for adjustment and management of other part of cardiac pacemaker: Secondary | ICD-10-CM

## 2014-07-31 DIAGNOSIS — I447 Left bundle-branch block, unspecified: Secondary | ICD-10-CM

## 2014-07-31 DIAGNOSIS — I493 Ventricular premature depolarization: Secondary | ICD-10-CM

## 2014-07-31 DIAGNOSIS — R9431 Abnormal electrocardiogram [ECG] [EKG]: Secondary | ICD-10-CM

## 2014-07-31 DIAGNOSIS — R001 Bradycardia, unspecified: Secondary | ICD-10-CM

## 2014-07-31 LAB — MDC_IDC_ENUM_SESS_TYPE_INCLINIC
Battery Voltage: 2.79 V
Brady Statistic AP VS Percent: 0 %
Brady Statistic AS VP Percent: 0 %
Brady Statistic AS VS Percent: 100 %
Date Time Interrogation Session: 20151027111810
Lead Channel Impedance Value: 347 Ohm
Lead Channel Impedance Value: 463 Ohm
Lead Channel Pacing Threshold Amplitude: 0.75 V
Lead Channel Pacing Threshold Amplitude: 1 V
Lead Channel Pacing Threshold Pulse Width: 0.4 ms
Lead Channel Pacing Threshold Pulse Width: 0.4 ms
Lead Channel Sensing Intrinsic Amplitude: 5.6 mV
Lead Channel Setting Pacing Amplitude: 2 V
Lead Channel Setting Pacing Amplitude: 2.5 V
Lead Channel Setting Sensing Sensitivity: 2.8 mV
MDC IDC MSMT BATTERY IMPEDANCE: 250 Ohm
MDC IDC MSMT BATTERY REMAINING LONGEVITY: 120 mo
MDC IDC MSMT LEADCHNL RA SENSING INTR AMPL: 2.8 mV
MDC IDC SET LEADCHNL RV PACING PULSEWIDTH: 0.4 ms
MDC IDC STAT BRADY AP VP PERCENT: 0 %

## 2014-07-31 NOTE — Patient Instructions (Signed)
Your physician wants you to follow-up in: 1 year. You will receive a reminder letter in the mail two months in advance. If you don't receive a letter, please call our office to schedule the follow-up appointment.  Your next appointment will be scheduled in our new office located at :  [facility]  [address] [place]  [place], [place] [address]

## 2014-07-31 NOTE — Progress Notes (Signed)
Patient Care Team: Juluis Pitch, MD as PCP - General (Family Medicine)   HPI  Barbara Marks is a 80 y.o. female Seen in followup for fatigue and apparent bradycardia. She is status post pacemaker implantation wit h generator replacement May 2011   She has known PVCs giving rise to bigeminy for which we ultimately  initiated propafenone and then stopped as it was not sufficiently effective    Has history cardiac cath in 2004 with nonobstructive CAD and profound significant long history of depression and anxiety requiring ECT treatment dating back to the 27s ; ECT regime has been terminated by her new psychiatrist; she is now on Cymbalta. She is doing much better.  No significant heart complaints     Past Medical History  Diagnosis Date  . Depression     a. h/o ECT  . Premature ventricular contraction     a. managed with propafenone.  . Hyperlipidemia, mixed   . Symptomatic bradycardia     a. s/p MDT PPM in 08/2005;  b. 02/2010 Gen change->MDT Adapta DC PPM, ser # [id number] H.  . HTN (hypertension)   . Nephrolithiasis   . Diverticulitis   . Memory problem     "states memory issues"  . CAD (coronary artery disease) Non-obstructive    a. 2004 Cath: nonobs dzs.  . Pancreatic cyst     a. 83151 Endoscopic U/S: nl UGI tract, 1-7mm pancreatic cysts, no masses/nodules.  . DDD (degenerative disc disease)     a. with chronic right sided back pain - improves after seeing chiropractor.    Past Surgical History  Procedure Laterality Date  . Appendectomy  1952  . Tumor excision      And nemamgeomas  . Cholecystectomy  1983  . Pacemaker insertion Left     Medtronic  . Eus N/A 06/22/2013    Procedure: UPPER ENDOSCOPIC ULTRASOUND (EUS) LINEAR;  Surgeon: Milus Banister, MD;  Location: [facility];  Service: Endoscopy;  Laterality: N/A;    Current Outpatient Prescriptions  Medication Sig Dispense Refill  . amLODipine (NORVASC) 5 MG tablet Take 5 mg by mouth every  morning.       . cholecalciferol (VITAMIN D) 1000 UNITS tablet Take 2,000 Units by mouth daily.        . Coenzyme Q10 (COQ10) 150 MG CAPS Take 1 capsule by mouth daily.        . Cyanocobalamin (VITAMIN B 12 PO) Take 2 tablets by mouth daily.       . Glucosamine-Chondroit-Vit C-Mn (GLUCOSAMINE-CHONDROITIN) CAPS Take 1 capsule by mouth daily.       [name] LORazepam (ATIVAN) 0.5 MG tablet Take 1 tablet (0.5 mg total) by mouth every 8 (eight) hours as needed for anxiety.  30 tablet  0  . Melatonin 1 MG TABS Take 1 mg by mouth daily.      . Multiple Vitamin (MULTIVITAMIN) tablet Take 1 tablet by mouth daily.        . Multiple Vitamins-Minerals (PRESERVISION AREDS) TABS Take by mouth daily.      . polyethylene glycol (MIRALAX / GLYCOLAX) packet Take 17 g by mouth as needed for mild constipation.       . potassium chloride (K-DUR) 10 MEQ tablet Take 10 mEq by mouth daily.      . Probiotic Product (PROBIOTIC DAILY PO) Take by mouth daily.      . Red Yeast Rice 600 MG CAPS Take 600 mg by mouth daily.      [name]  Vitamin B Complex-C CAPS Take 1 capsule by mouth daily.         No current facility-administered medications for this visit.    Allergies  Allergen Reactions  . Codeine     unknown  . Simvastatin     unknown    Review of Systems negative except from HPI and PMH  Physical Exam BP 110/60  Pulse 83  Ht 5\' 6"  (1.676 m)  Wt 154 lb (69.854 kg)  BMI 24.87 kg/m2 Well developed and well nourished in no acute distress HENT normal E scleral and icterus clear Neck Supple JVP flat; carotids brisk and full Clear to ausculation  regular rate and rhythm, no murmurs gallops or rub Soft with active bowel sounds No clubbing cyanosis none Edema Alert and oriented, grossly normal motor and sensory function Skin Warm and Dry  Sinus rhythm at 83 Intervals 16//11/42 PVCs with a right bundle inferior axis pathology in a pattern of bigeminy  Assessment and   Plan  Pacemaker-Medtronic  PVCs  Hypertension  Apparent bradycardia  Device function is normal. She continues with PVCs. No further interventions.  Her blood pressure is relatively low. It might be reasonable to see if we can wean her off her antihypertensives.

## 2014-10-16 ENCOUNTER — Ambulatory Visit: Payer: Self-pay | Admitting: Ophthalmology

## 2014-10-16 LAB — POTASSIUM: Potassium: 3.7 mmol/L (ref 3.5–5.1)

## 2014-10-23 ENCOUNTER — Ambulatory Visit: Payer: Self-pay | Admitting: Ophthalmology

## 2014-11-07 ENCOUNTER — Ambulatory Visit: Payer: Self-pay | Admitting: Gastroenterology

## 2014-11-26 ENCOUNTER — Ambulatory Visit: Payer: Self-pay | Admitting: Ophthalmology

## 2014-12-04 ENCOUNTER — Ambulatory Visit: Payer: Self-pay | Admitting: Ophthalmology

## 2014-12-11 DIAGNOSIS — E785 Hyperlipidemia, unspecified: Secondary | ICD-10-CM | POA: Insufficient documentation

## 2014-12-11 DIAGNOSIS — M1990 Unspecified osteoarthritis, unspecified site: Secondary | ICD-10-CM | POA: Insufficient documentation

## 2014-12-26 ENCOUNTER — Encounter: Payer: Self-pay | Admitting: Cardiovascular Disease

## 2014-12-26 ENCOUNTER — Ambulatory Visit (INDEPENDENT_AMBULATORY_CARE_PROVIDER_SITE_OTHER): Payer: PPO | Admitting: Cardiovascular Disease

## 2014-12-26 VITALS — BP 130/80 | HR 70 | Ht 66.0 in | Wt 161.5 lb

## 2014-12-26 DIAGNOSIS — I493 Ventricular premature depolarization: Secondary | ICD-10-CM

## 2014-12-26 DIAGNOSIS — I25119 Atherosclerotic heart disease of native coronary artery with unspecified angina pectoris: Secondary | ICD-10-CM | POA: Diagnosis not present

## 2014-12-26 DIAGNOSIS — R079 Chest pain, unspecified: Secondary | ICD-10-CM

## 2014-12-26 DIAGNOSIS — I10 Essential (primary) hypertension: Secondary | ICD-10-CM | POA: Diagnosis not present

## 2014-12-26 DIAGNOSIS — F332 Major depressive disorder, recurrent severe without psychotic features: Secondary | ICD-10-CM

## 2014-12-26 NOTE — Assessment & Plan Note (Signed)
Blood pressure is well controlled on today's visit. No changes made to the medications. 

## 2014-12-26 NOTE — Assessment & Plan Note (Signed)
No chest pain on today's visit. No further workup at this time

## 2014-12-26 NOTE — Assessment & Plan Note (Signed)
Currently with no symptoms of angina. No further workup at this time. Continue current medication regimen. 

## 2014-12-26 NOTE — Patient Instructions (Signed)
You are doing well. No medication changes were made.  Please call us if you have new issues that need to be addressed before your next appt.  Your physician wants you to follow-up in: 12 months.  You will receive a reminder letter in the mail two months in advance. If you don't receive a letter, please call our office to schedule the follow-up appointment. 

## 2014-12-26 NOTE — Progress Notes (Signed)
Patient ID: Barbara Marks, female    DOB: 11-16-1933, 81 y.o.   MRN: [id number]  HPI Comments: Barbara Marks is a 81 year old woman,   cardiac cath in 2004 with nonobstructive CAD, pacemaker placed for bradycardia, chronic PVCs, profound significant long history of depression and anxiety requiring ECT treatment dating back to the 80s, history of kidney stones with lithotripsy, who presents for routine followup of her PVCs.  She reports that overall she is doing well.  She continues to have frequent PVCs but is asymptomatic. She does not want further medications or workup for her PVCs Total cholesterol 245, LDL 150 She takes 1 red yeast rice daily She does not want any cholesterol medication No recent chest pain symptoms, otherwise she feels well with no complaints   EKG on today's visit shows Normal sinus rhythm with rate 70 beats per minute with PVCs in a bigeminal pattern, otherwise normal-appearing EKG  Other past medical history Previous admission to [facility] for chest pain. She had normal echocardiogram and stress test These were reviewed with her  previously referred to Dr. Caryl Comes for her symptomatic PVCs. started on Rythmol b.i.d.,  improvement of her symptoms. She continued to have PVCs and was asymptomatic. She stopped the Rythmol on her own She did not have any side effects on the Rythmol   Long history of  ECT treatments every 4-5 weeks.  She was followed by Dr. Thurmond Butts.  She reports that she is ECT treatment plan tomorrow She reports having some memory loss and wonders if this could be from ECT treatment    Allergies  Allergen Reactions  . Codeine     unknown  . Simvastatin     unknown    Outpatient Encounter Prescriptions as of 12/26/2014  Medication Sig  . amLODipine (NORVASC) 5 MG tablet Take 5 mg by mouth every morning.   [name] buPROPion (WELLBUTRIN XL) 300 MG 24 hr tablet Take 150 mg by mouth daily.   . cholecalciferol (VITAMIN D) 1000 UNITS tablet Take 2,000 Units  by mouth daily.    . Coenzyme Q10 (COQ10) 150 MG CAPS Take 1 capsule by mouth daily.    . Cyanocobalamin (VITAMIN B 12 PO) Take 2 tablets by mouth daily.   . DULoxetine (CYMBALTA) 60 MG capsule Take 60 mg by mouth every other day.  . Glucosamine-Chondroit-Vit C-Mn (GLUCOSAMINE-CHONDROITIN) CAPS Take 1 capsule by mouth daily.   [name] LORazepam (ATIVAN) 0.5 MG tablet Take 1 tablet (0.5 mg total) by mouth every 8 (eight) hours as needed for anxiety.  . Melatonin 1 MG TABS Take 1 mg by mouth daily.  . Multiple Vitamin (MULTIVITAMIN) tablet Take 1 tablet by mouth daily.    . Multiple Vitamins-Minerals (PRESERVISION AREDS) TABS Take by mouth daily.  . polyethylene glycol (MIRALAX / GLYCOLAX) packet Take 17 g by mouth as needed for mild constipation.   . potassium chloride (K-DUR) 10 MEQ tablet Take 10 mEq by mouth daily.  . Probiotic Product (PROBIOTIC DAILY PO) Take by mouth daily.  . Red Yeast Rice 600 MG CAPS Take 600 mg by mouth daily.  . Vitamin B Complex-C CAPS Take 1 capsule by mouth daily.      Past Medical History  Diagnosis Date  . Depression     a. h/o ECT  . Premature ventricular contraction     a. managed with propafenone.  . Hyperlipidemia, mixed   . Symptomatic bradycardia     a. s/p MDT PPM in 08/2005;  b. 02/2010 Gen change->MDT  Adapta DC PPM, ser # [id number] H.  . HTN (hypertension)   . Nephrolithiasis   . Diverticulitis   . Memory problem     "states memory issues"  . CAD (coronary artery disease) Non-obstructive    a. 2004 Cath: nonobs dzs.  . Pancreatic cyst     a. [id number] Endoscopic U/S: nl UGI tract, 1-27mm pancreatic cysts, no masses/nodules.  . DDD (degenerative disc disease)     a. with chronic right sided back pain - improves after seeing chiropractor.    Past Surgical History  Procedure Laterality Date  . Appendectomy  1952  . Tumor excision      And nemamgeomas  . Cholecystectomy  1983  . Pacemaker insertion Left     Medtronic  . Eus N/A 06/22/2013     Procedure: UPPER ENDOSCOPIC ULTRASOUND (EUS) LINEAR;  Surgeon: Milus Banister, MD;  Location: [facility];  Service: Endoscopy;  Laterality: N/A;  . Cataract extraction, bilateral      Social History  reports that she has never smoked. She does not have any smokeless tobacco history on file. She reports that she does not drink alcohol or use illicit drugs.  Family History family history includes Other in her mother; Stroke in her father.   Review of Systems  Constitutional: Negative.   Respiratory: Negative.   Cardiovascular: Negative.   Gastrointestinal: Negative.        Right lower quadrant discomfort  Musculoskeletal: Negative.   Skin: Negative.   Neurological: Negative.   Hematological: Negative.   Psychiatric/Behavioral: Positive for dysphoric mood.  All other systems reviewed and are negative.  BP 130/80 mmHg  Pulse 70  Ht 5\' 6"  (1.676 m)  Wt 161 lb 8 oz (73.256 kg)  BMI 26.08 kg/m2  Physical Exam  Constitutional: She is oriented to person, place, and time. She appears well-developed and well-nourished.  HENT:  Head: Normocephalic.  Nose: Nose normal.  Mouth/Throat: Oropharynx is clear and moist.  Eyes: Conjunctivae are normal. Pupils are equal, round, and reactive to light.  Neck: Normal range of motion. Neck supple. No JVD present.  Cardiovascular: Normal rate, regular rhythm, S1 normal, S2 normal, normal heart sounds and intact distal pulses.  Exam reveals no gallop and no friction rub.   No murmur heard. Pulmonary/Chest: Effort normal and breath sounds normal. No respiratory distress. She has no wheezes. She has no rales. She exhibits no tenderness.  Abdominal: Soft. Bowel sounds are normal. She exhibits no distension. There is no tenderness.  Musculoskeletal: Normal range of motion. She exhibits no edema or tenderness.  Lymphadenopathy:    She has no cervical adenopathy.  Neurological: She is alert and oriented to person, place, and time. Coordination normal.   Skin: Skin is warm and dry. No rash noted. No erythema.  Psychiatric: She has a normal mood and affect. Her behavior is normal. Judgment and thought content normal.    Assessment and Plan  Nursing note and vitals reviewed.

## 2014-12-26 NOTE — Assessment & Plan Note (Signed)
Currently in remission, chronic issue

## 2014-12-26 NOTE — Assessment & Plan Note (Signed)
She has taken herself off Rythmol. She reports that she is asymptomatic.  No further workup at this time

## 2015-01-11 DIAGNOSIS — F332 Major depressive disorder, recurrent severe without psychotic features: Secondary | ICD-10-CM | POA: Insufficient documentation

## 2015-01-11 DIAGNOSIS — F339 Major depressive disorder, recurrent, unspecified: Secondary | ICD-10-CM | POA: Insufficient documentation

## 2015-02-03 NOTE — Op Note (Signed)
PATIENT NAME:  Barbara Marks, Barbara Marks MR#:  [id number] DATE OF BIRTH:  [birth year]  DATE OF PROCEDURE:  10/23/2014  PREOPERATIVE DIAGNOSIS:  Nuclear sclerotic cataract of the right eye.   POSTOPERATIVE DIAGNOSIS:  Nuclear sclerotic cataract of the right eye.   OPERATIVE PROCEDURE:  Cataract extraction by phacoemulsification with implant of intraocular lens to right eye.   SURGEON:  Birder Robson, MD  ANESTHESIA:  1. Managed anesthesia care.  2. Topical tetracaine drops followed by 2% Xylocaine jelly applied in the preoperative holding area.   COMPLICATIONS:  None.   TECHNIQUE:   Stop and chop.   DESCRIPTION OF PROCEDURE:  The patient was examined and consented in the preoperative holding area where the aforementioned topical anesthesia was applied to the right eye and then brought back to the Operating Room where the right eye was prepped and draped in the usual sterile ophthalmic fashion and a lid speculum was placed. A paracentesis was created with the side port blade and the anterior chamber was filled with viscoelastic. A near clear corneal incision was performed with the steel keratome. A continuous curvilinear capsulorrhexis was performed with a cystotome followed by the capsulorrhexis forceps. Hydrodissection and hydrodelineation were carried out with BSS on a blunt cannula. The lens was removed in a stop and chop technique and the remaining cortical material was removed with the irrigation-aspiration handpiece. The capsular bag was inflated with viscoelastic and the Tecnis ZCB00, 22.5-diopter lens, serial number [id number], was placed in the capsular bag without complication. The remaining viscoelastic was removed from the eye with the irrigation-aspiration handpiece. The wounds were hydrated. The anterior chamber was flushed with Miostat and the eye was inflated to physiologic pressure. 0.1 mL of cefuroxime concentration 10 mg/mL was placed in the anterior chamber. The wounds were found to be  water tight. The eye was dressed with Vigamox. The patient was given protective glasses to wear throughout the day and a shield with which to sleep tonight. The patient was also given drops with which to begin a drop regimen today and will follow up with me in one day.    ____________________________ Livingston Diones. Aretta Stetzel, MD wlp:nb D: 10/23/2014 20:48:08 ET T: 10/23/2014 23:40:13 ET JOB#: [id number]  cc: Kuulei Kleier L. Lora Glomski, MD, <Dictator> Livingston Diones Malary Aylesworth MD ELECTRONICALLY SIGNED 10/24/2014 16:10

## 2015-02-03 NOTE — Op Note (Signed)
PATIENT NAME:  Barbara Marks, MELLING MR#:  [id number] DATE OF BIRTH:  Jan 07, 1934  DATE OF PROCEDURE:  12/04/2014  PREOPERATIVE DIAGNOSIS:  Nuclear sclerotic cataract of the left eye.   POSTOPERATIVE DIAGNOSIS:  Nuclear sclerotic cataract of the left eye.   OPERATIVE PROCEDURE:  Cataract extraction by phacoemulsification with implant of intraocular lens to left eye.   SURGEON:  Birder Robson, MD.   ANESTHESIA:  1. Managed anesthesia care.  2. Topical tetracaine drops followed by 2% Xylocaine jelly applied in the preoperative holding area.   COMPLICATIONS:  None.   TECHNIQUE:   Stop and chop.    DESCRIPTION OF PROCEDURE:  The patient was examined and consented in the preoperative holding area where the aforementioned topical anesthesia was applied to the left eye and then brought back to the Operating Room where the left eye was prepped and draped in the usual sterile ophthalmic fashion and a lid speculum was placed. A paracentesis was created with the side port blade and the anterior chamber was filled with viscoelastic. A near clear corneal incision was performed with the steel keratome. A continuous curvilinear capsulorrhexis was performed with a cystotome followed by the capsulorrhexis forceps. Hydrodissection and hydrodelineation were carried out with BSS on a blunt cannula. The lens was removed in a stop and chop technique and the remaining cortical material was removed with the irrigation-aspiration handpiece. The capsular bag was inflated with viscoelastic and the Tecnis ZCB00 22.0 -diopter lens, serial number [id number] was placed in the capsular bag without complication. The remaining viscoelastic was removed from the eye with the irrigation-aspiration handpiece. The wounds were hydrated. The anterior chamber was flushed with Miostat and the eye was inflated to physiologic pressure. 0.1 mL of cefuroxime concentration 10 mg/mL was placed in the anterior chamber. The wounds were found to be  water tight. The eye was dressed with Vigamox. The patient was given protective glasses to wear throughout the day and a shield with which to sleep tonight. The patient was also given drops with which to begin a drop regimen today and will follow-up with me in one day.    ____________________________ Livingston Diones. Traeton Bordas, MD wlp:mc D: 12/04/2014 21:17:55 ET T: 12/05/2014 09:25:38 ET JOB#: [id number]  cc: Bethany Cumming L. Inaya Gillham, MD, <Dictator> Livingston Diones Idelle Reimann MD ELECTRONICALLY SIGNED 12/05/2014 11:26

## 2015-04-10 ENCOUNTER — Encounter: Payer: Self-pay | Admitting: Internal Medicine

## 2015-04-23 ENCOUNTER — Encounter: Payer: Self-pay | Admitting: Internal Medicine

## 2015-05-14 ENCOUNTER — Encounter: Payer: Self-pay | Admitting: Psychiatry

## 2015-05-14 ENCOUNTER — Ambulatory Visit (INDEPENDENT_AMBULATORY_CARE_PROVIDER_SITE_OTHER): Payer: PPO | Admitting: Psychiatry

## 2015-05-14 VITALS — BP 122/80 | HR 48 | Temp 98.1°F | Ht 65.5 in | Wt 162.0 lb

## 2015-05-14 DIAGNOSIS — F324 Major depressive disorder, single episode, in partial remission: Secondary | ICD-10-CM | POA: Diagnosis not present

## 2015-05-14 DIAGNOSIS — F325 Major depressive disorder, single episode, in full remission: Secondary | ICD-10-CM

## 2015-05-14 MED ORDER — BUPROPION HCL ER (XL) 300 MG PO TB24: 300.0000 mg | ORAL_TABLET | ORAL | Status: DC

## 2015-05-14 MED ORDER — DULOXETINE HCL 60 MG PO CPEP
60.0000 mg | ORAL_CAPSULE | ORAL | Status: DC
Start: 2015-05-14 — End: 2015-11-18

## 2015-05-15 NOTE — Progress Notes (Signed)
[facility] MD Progress Note  05/15/2015 8:21 PM Barbara Marks  MRN:  [id number] Subjective:  Patient reports that her mood is been good. She still has some fatigue from time to time. No return of serious depression. Stays active. Sleeping well. No other new complaints. She seems to have a positive social life. Principal Problem: @PPROB @ Diagnosis:   Patient Active Problem List   Diagnosis Date Noted  . Depression, major, severe recurrence [F33.2] 01/11/2015  . Depression, major, recurrent [F33.9] 01/11/2015  . Arthritis [M19.90] 12/11/2014  . HLD (hyperlipidemia) [E78.5] 12/11/2014  . BP (high blood pressure) [I10] 05/21/2014  . Hypokalemia [E87.6] 09/07/2013  . Prolonged QT interval [I45.81] 09/07/2013  . Anemia [D64.9] 09/07/2013  . Chest pain [R07.9] 09/06/2013  . Nonspecific (abnormal) findings on radiological and other examination of gastrointestinal tract [R93.3] 06/22/2013  . Left bundle branch block [I44.7] 06/09/2012  . TRANSAMINASES, SERUM, ELEVATED [R74.0] 11/28/2010  . CAD, NATIVE VESSEL [I25.10] 11/03/2010  . Acalcerosis [E83.50] 08/27/2010  . Urethral caruncle [N36.2] 05/07/2010  . PPM-Medtronic [Z95.0] 02/04/2010  . Calculus of kidney [N20.0] 12/16/2009  . Hematuria, microscopic [R31.2] 12/16/2009  . Renal colic [F81] 82/99/3716  . CHEST PAIN, NON-CARDIAC [R07.89] 03/22/2009  . HYPERLIPIDEMIA-MIXED [E78.5] 02/22/2009  . Major depression [F32.2] 02/22/2009  . Essential hypertension [I10] 02/22/2009  . Frequent PVCs [I49.3] 02/22/2009  . BRADYCARDIA [I49.8] 02/22/2009   Total Time spent with patient: 30 minutes   Past Medical History:  Past Medical History  Diagnosis Date  . Depression     a. h/o ECT  . Premature ventricular contraction     a. managed with propafenone.  . Hyperlipidemia, mixed   . Symptomatic bradycardia     a. s/p MDT PPM in 08/2005;  b. 02/2010 Gen change->MDT Adapta DC PPM, ser # [id number] H.  . HTN (hypertension)   . Nephrolithiasis   .  Diverticulitis   . Memory problem     "states memory issues"  . CAD (coronary artery disease) Non-obstructive    a. 2004 Cath: nonobs dzs.  . Pancreatic cyst     a. 17510 Endoscopic U/S: nl UGI tract, 1-82mm pancreatic cysts, no masses/nodules.  . DDD (degenerative disc disease)     a. with chronic right sided back pain - improves after seeing chiropractor.    Past Surgical History  Procedure Laterality Date  . Appendectomy  1952  . Tumor excision      And nemamgeomas  . Cholecystectomy  1983  . Pacemaker insertion Left     Medtronic  . Eus N/A 06/22/2013    Procedure: UPPER ENDOSCOPIC ULTRASOUND (EUS) LINEAR;  Surgeon: Milus Banister, MD;  Location: [facility];  Service: Endoscopy;  Laterality: N/A;  . Cataract extraction, bilateral     Family History:  Family History  Problem Relation Age of Onset  . Stroke Father     cva after cea in his 23's, died @ 18.  . Other Mother     died @ 100 - old age.   Social History:  History  Alcohol Use No     History  Drug Use No    Social History   Social History  . Marital Status: Widowed    Spouse Name: N/A  . Number of Children: N/A  . Years of Education: N/A   Occupational History  . Retired    Social History Main Topics  . Smoking status: Never Smoker   . Smokeless tobacco: None  . Alcohol Use: No  . Drug Use: No  .  Sexual Activity: No   Other Topics Concern  . None   Social History Narrative   Lives in [place].  She does not routinely exercise.   Additional History:    Sleep: Fair  Appetite:  Fair   Assessment:quite stable and tolerating current medicine. No indication to change any treatment. No indication to return to ECT. Supportive counseling completed. We can follow-up in another 4 months. Refilling medicine.   Musculoskeletal: Strength & Muscle Tone: within normal limits Gait & Station: normal Patient leans: N/A   Psychiatric Specialty Exam: Physical Exam  ROS  Blood pressure 122/80,  pulse 48, temperature 98.1 F (36.7 C), temperature source Tympanic, height 5' 5.5" (1.664 m), weight 73.483 kg (162 lb), SpO2 97 %.Body mass index is 26.54 kg/(m^2).  General Appearance: Well Groomed  Engineer, water::  Good  Speech:  Normal Rate  Volume:  Normal  Mood:  Euthymic  Affect:  Appropriate  Thought Process:  Coherent  Orientation:  Full (Time, Place, and Person)  Thought Content:  Negative  Suicidal Thoughts:  No  Homicidal Thoughts:  No  Memory:  Immediate;   Good Recent;   Good Remote;   Good  Judgement:  Fair  Insight:  Fair  Psychomotor Activity:  Normal  Concentration:  Good  Recall:  Good  Fund of Knowledge:Good  Language: Good  Akathisia:  No  Handed:  Right  AIMS (if indicated):     Assets:  Communication Skills Desire for Improvement Financial Resources/Insurance Social Support  ADL's:  Intact  Cognition: WNL  Sleep:        Current Medications: Current Outpatient Prescriptions  Medication Sig Dispense Refill  . amLODipine (NORVASC) 5 MG tablet Take 5 mg by mouth every morning.     [name] buPROPion (WELLBUTRIN XL) 300 MG 24 hr tablet Take 1 tablet (300 mg total) by mouth every other day. 30 tablet 3  . cholecalciferol (VITAMIN D) 1000 UNITS tablet Take 2,000 Units by mouth daily.      . Coenzyme Q10 (COQ10) 150 MG CAPS Take 1 capsule by mouth daily.      . Cyanocobalamin (VITAMIN B 12 PO) Take 2 tablets by mouth daily.     . DULoxetine (CYMBALTA) 60 MG capsule Take 1 capsule (60 mg total) by mouth every other day. 30 capsule 3  . Glucosamine-Chondroit-Vit C-Mn (GLUCOSAMINE-CHONDROITIN) CAPS Take 1 capsule by mouth daily.     [name] ketoconazole (NIZORAL) 2 % shampoo APPLY TO AFFECTED AREA EVERY DAY  6  . KLOR-CON M10 10 MEQ tablet Take 10 mEq by mouth daily.  11  . Melatonin 1 MG TABS Take 1 mg by mouth daily.    . Multiple Vitamin (MULTIVITAMIN) tablet Take 1 tablet by mouth daily.      . Multiple Vitamins-Minerals (PRESERVISION AREDS) TABS Take by mouth  daily.    . polyethylene glycol (MIRALAX / GLYCOLAX) packet Take 17 g by mouth as needed for mild constipation.     . potassium chloride (K-DUR) 10 MEQ tablet Take 10 mEq by mouth daily.    . Probiotic Product (PROBIOTIC DAILY PO) Take by mouth daily.    . Red Yeast Rice 600 MG CAPS Take 600 mg by mouth daily.    . Vitamin B Complex-C CAPS Take 1 capsule by mouth daily.      [name] LORazepam (ATIVAN) 0.5 MG tablet Take 1 tablet (0.5 mg total) by mouth every 8 (eight) hours as needed for anxiety. (Patient not taking: Reported on 05/14/2015) 30 tablet 0  No current facility-administered medications for this visit.    Lab Results: No results found for this or any previous visit (from the past 48 hour(s)).  Physical Findings: AIMS:  , ,  ,  ,    CIWA:    COWS:     Treatment Plan Summary: Medication management and Plan patient is tolerating antidepressives well. She alternates her to medications and does not have significant withdrawal problems. Supportive counseling. If she feels this is working out well for her there is no indication to change it. Follow-up 4 months.   Medical Decision Making:  Established Problem, Stable/Improving (1), Review of Psycho-Social Stressors (1) and Review of Medication Regimen & Side Effects (2)     Petina Muraski 05/15/2015, 8:21 PM

## 2015-06-26 ENCOUNTER — Ambulatory Visit: Payer: No Typology Code available for payment source | Admitting: Cardiovascular Disease

## 2015-08-08 ENCOUNTER — Ambulatory Visit (INDEPENDENT_AMBULATORY_CARE_PROVIDER_SITE_OTHER): Payer: PPO | Admitting: Cardiovascular Disease

## 2015-08-08 ENCOUNTER — Encounter: Payer: Self-pay | Admitting: Cardiovascular Disease

## 2015-08-08 VITALS — BP 140/62 | HR 78 | Ht 65.5 in | Wt 168.5 lb

## 2015-08-08 DIAGNOSIS — F332 Major depressive disorder, recurrent severe without psychotic features: Secondary | ICD-10-CM

## 2015-08-08 DIAGNOSIS — I493 Ventricular premature depolarization: Secondary | ICD-10-CM

## 2015-08-08 DIAGNOSIS — I10 Essential (primary) hypertension: Secondary | ICD-10-CM | POA: Diagnosis not present

## 2015-08-08 DIAGNOSIS — I25119 Atherosclerotic heart disease of native coronary artery with unspecified angina pectoris: Secondary | ICD-10-CM

## 2015-08-08 NOTE — Assessment & Plan Note (Signed)
Currently with no symptoms of angina. No further workup at this time. Continue current medication regimen. 

## 2015-08-08 NOTE — Patient Instructions (Signed)
You are doing well. No medication changes were made.  Please call us if you have new issues that need to be addressed before your next appt.  Your physician wants you to follow-up in: 12 months.  You will receive a reminder letter in the mail two months in advance. If you don't receive a letter, please call our office to schedule the follow-up appointment. 

## 2015-08-08 NOTE — Assessment & Plan Note (Signed)
She has taken herself off Rythmol.  she is asymptomatic.  No further workup at this time

## 2015-08-08 NOTE — Progress Notes (Signed)
Patient ID: Barbara Marks, female    DOB: August 22, 1934, 81 y.o.   MRN: [id number]  HPI Comments: Barbara Marks is a 81 year old woman,   cardiac cath in 2004 with nonobstructive CAD, pacemaker placed for bradycardia, chronic PVCs, profound significant long history of depression and anxiety requiring ECT treatment dating back to the 80s, history of kidney stones with lithotripsy, who presents for routine followup of her PVCs.  In follow-up, she reports that she is doing very well. She has stopped ECT treatments. She has come under the care of a new psychiatrist Medication changes have been made Overall she feels well In addition has a new live-in partner, they are doing things together on a frequent basis  Has frequent PVCs but is asymptomatic  previous lab work showing Total cholesterol 245, LDL 150 She takes 1 red yeast rice daily She does not want any cholesterol medication No recent chest pain symptoms, otherwise she feels well with no complaints  EKG on today's visit shows Normal sinus rhythm with rate 78 beats per minute with PVCs in a bigeminal pattern  Other past medical history Previous admission to [facility] for chest pain. She had normal echocardiogram and stress test These were reviewed with her  previously referred to Dr. Caryl Comes for her symptomatic PVCs. started on Rythmol b.i.d.,  improvement of her symptoms. She continued to have PVCs and was asymptomatic. She stopped the Rythmol on her own She did not have any side effects on the Rythmol   Long history of  ECT treatments every 4-5 weeks.  She was followed by Dr. Thurmond Butts.  She reports that she is ECT treatment plan tomorrow She reports having some memory loss and wonders if this could be from ECT treatment    Allergies  Allergen Reactions  . Codeine     unknown  . Simvastatin     unknown    Outpatient Encounter Prescriptions as of 08/08/2015  Medication Sig  . amLODipine (NORVASC) 5 MG tablet Take 5 mg by mouth  every morning.   [name] buPROPion (WELLBUTRIN XL) 300 MG 24 hr tablet Take 1 tablet (300 mg total) by mouth every other day.  . cholecalciferol (VITAMIN D) 1000 UNITS tablet Take 2,000 Units by mouth daily.    . Coenzyme Q10 (COQ10) 150 MG CAPS Take 1 capsule by mouth daily.    . Cyanocobalamin (VITAMIN B 12 PO) Take 2 tablets by mouth daily.   . DULoxetine (CYMBALTA) 60 MG capsule Take 1 capsule (60 mg total) by mouth every other day.  . Glucosamine-Chondroit-Vit C-Mn (GLUCOSAMINE-CHONDROITIN) CAPS Take 1 capsule by mouth daily.   [name] ketoconazole (NIZORAL) 2 % shampoo APPLY TO AFFECTED AREA EVERY DAY  . KLOR-CON M10 10 MEQ tablet Take 10 mEq by mouth daily.  [name] LORazepam (ATIVAN) 0.5 MG tablet Take 1 tablet (0.5 mg total) by mouth every 8 (eight) hours as needed for anxiety.  . Melatonin 1 MG TABS Take 1 mg by mouth daily.  . Multiple Vitamin (MULTIVITAMIN) tablet Take 1 tablet by mouth daily.    . Multiple Vitamins-Minerals (PRESERVISION AREDS) TABS Take by mouth daily.  . polyethylene glycol (MIRALAX / GLYCOLAX) packet Take 17 g by mouth as needed for mild constipation.   . potassium chloride (K-DUR) 10 MEQ tablet Take 10 mEq by mouth daily.  . Probiotic Product (PROBIOTIC DAILY PO) Take by mouth daily.  . Red Yeast Rice 600 MG CAPS Take 600 mg by mouth daily.  . Vitamin B Complex-C CAPS Take 1  capsule by mouth daily.     No facility-administered encounter medications on file as of 08/08/2015.    Past Medical History  Diagnosis Date  . Depression     a. h/o ECT  . Premature ventricular contraction     a. managed with propafenone.  . Hyperlipidemia, mixed   . Symptomatic bradycardia     a. s/p MDT PPM in 08/2005;  b. 02/2010 Gen change->MDT Adapta DC PPM, ser # [id number] H.  . HTN (hypertension)   . Nephrolithiasis   . Diverticulitis   . Memory problem     "states memory issues"  . CAD (coronary artery disease) Non-obstructive    a. 2004 Cath: nonobs dzs.  . Pancreatic cyst     a.  46503 Endoscopic U/S: nl UGI tract, 1-69mm pancreatic cysts, no masses/nodules.  . DDD (degenerative disc disease)     a. with chronic right sided back pain - improves after seeing chiropractor.    Past Surgical History  Procedure Laterality Date  . Appendectomy  1952  . Tumor excision      And nemamgeomas  . Cholecystectomy  1983  . Pacemaker insertion Left     Medtronic  . Eus N/A 06/22/2013    Procedure: UPPER ENDOSCOPIC ULTRASOUND (EUS) LINEAR;  Surgeon: Milus Banister, MD;  Location: [facility];  Service: Endoscopy;  Laterality: N/A;  . Cataract extraction, bilateral      Social History  reports that she has never smoked. She does not have any smokeless tobacco history on file. She reports that she does not drink alcohol or use illicit drugs.  Family History family history includes Other in her mother; Stroke in her father.   Review of Systems  Constitutional: Negative.   Respiratory: Negative.   Cardiovascular: Negative.   Gastrointestinal: Negative.        Right lower quadrant discomfort  Musculoskeletal: Negative.   Skin: Negative.   Neurological: Negative.   Hematological: Negative.   Psychiatric/Behavioral: Positive for dysphoric mood.  All other systems reviewed and are negative.  BP 140/62 mmHg  Pulse 78  Ht 5' 5.5" (1.664 m)  Wt 168 lb 8 oz (76.431 kg)  BMI 27.60 kg/m2  Physical Exam  Constitutional: She is oriented to person, place, and time. She appears well-developed and well-nourished.  HENT:  Head: Normocephalic.  Nose: Nose normal.  Mouth/Throat: Oropharynx is clear and moist.  Eyes: Conjunctivae are normal. Pupils are equal, round, and reactive to light.  Neck: Normal range of motion. Neck supple. No JVD present.  Cardiovascular: Normal rate, regular rhythm, S1 normal, S2 normal, normal heart sounds and intact distal pulses.  Exam reveals no gallop and no friction rub.   No murmur heard. Pulmonary/Chest: Effort normal and breath sounds  normal. No respiratory distress. She has no wheezes. She has no rales. She exhibits no tenderness.  Abdominal: Soft. Bowel sounds are normal. She exhibits no distension. There is no tenderness.  Musculoskeletal: Normal range of motion. She exhibits no edema or tenderness.  Lymphadenopathy:    She has no cervical adenopathy.  Neurological: She is alert and oriented to person, place, and time. Coordination normal.  Skin: Skin is warm and dry. No rash noted. No erythema.  Psychiatric: She has a normal mood and affect. Her behavior is normal. Judgment and thought content normal.    Assessment and Plan  Nursing note and vitals reviewed.

## 2015-08-08 NOTE — Assessment & Plan Note (Signed)
Seen by psychiatry No longer undergoing ECT which she is happy about Mood is better with new companion

## 2015-08-08 NOTE — Assessment & Plan Note (Signed)
Blood pressure is well controlled on today's visit. No changes made to the medications. 

## 2015-09-11 ENCOUNTER — Telehealth: Payer: Self-pay | Admitting: Internal Medicine

## 2015-09-11 NOTE — Telephone Encounter (Signed)
Called to schedule 1 year fu with Caryl Comes / pacer check.  Patient says she is not going to schedule.  Doesn't need to be seen bc pacer no longer works.   Just FYI.  Deleting recall.

## 2015-09-11 NOTE — Telephone Encounter (Signed)
S/w pt who states pacer wires were disconnected "a long time ago" but was not removed therefore, she states she does not need a f/u w/Dr. Caryl Comes. Inquired as to when it was disconnected. Pt responded "I don't know. Why don't you look in the records?" I do not see this in Dr. Olin Pia notes. Pt declined f/u w/[facility]. Forward to MD to make aware.

## 2015-10-15 DIAGNOSIS — M9903 Segmental and somatic dysfunction of lumbar region: Secondary | ICD-10-CM | POA: Diagnosis not present

## 2015-10-15 DIAGNOSIS — M5033 Other cervical disc degeneration, cervicothoracic region: Secondary | ICD-10-CM | POA: Diagnosis not present

## 2015-10-15 DIAGNOSIS — M9901 Segmental and somatic dysfunction of cervical region: Secondary | ICD-10-CM | POA: Diagnosis not present

## 2015-10-15 DIAGNOSIS — M5416 Radiculopathy, lumbar region: Secondary | ICD-10-CM | POA: Diagnosis not present

## 2015-11-12 DIAGNOSIS — M5033 Other cervical disc degeneration, cervicothoracic region: Secondary | ICD-10-CM | POA: Diagnosis not present

## 2015-11-12 DIAGNOSIS — M9903 Segmental and somatic dysfunction of lumbar region: Secondary | ICD-10-CM | POA: Diagnosis not present

## 2015-11-12 DIAGNOSIS — M9901 Segmental and somatic dysfunction of cervical region: Secondary | ICD-10-CM | POA: Diagnosis not present

## 2015-11-12 DIAGNOSIS — M5416 Radiculopathy, lumbar region: Secondary | ICD-10-CM | POA: Diagnosis not present

## 2015-11-14 ENCOUNTER — Ambulatory Visit: Payer: Self-pay | Admitting: Psychiatry

## 2015-11-18 ENCOUNTER — Ambulatory Visit (INDEPENDENT_AMBULATORY_CARE_PROVIDER_SITE_OTHER): Payer: PPO | Admitting: Psychiatry

## 2015-11-18 ENCOUNTER — Ambulatory Visit: Payer: PPO | Admitting: Psychiatry

## 2015-11-18 ENCOUNTER — Encounter: Payer: Self-pay | Admitting: Psychiatry

## 2015-11-18 VITALS — BP 142/90 | HR 53 | Temp 97.3°F | Ht 65.5 in | Wt 163.6 lb

## 2015-11-18 DIAGNOSIS — F332 Major depressive disorder, recurrent severe without psychotic features: Secondary | ICD-10-CM

## 2015-11-18 MED ORDER — BUPROPION HCL ER (XL) 300 MG PO TB24: 300.0000 mg | ORAL_TABLET | ORAL | Status: DC

## 2015-11-18 MED ORDER — DULOXETINE HCL 60 MG PO CPEP
60.0000 mg | ORAL_CAPSULE | ORAL | Status: DC
Start: 2015-11-18 — End: 2015-11-19

## 2015-11-18 NOTE — Progress Notes (Signed)
[facility] MD Progress Note  11/18/2015 6:51 PM Barbara Marks  MRN:  [id number] Subjective:  Follow-up for this 82 year old woman with a history of recurrent severe depression. She comes in today saying that for the past month she is been feeling much worse. She feels like her depression has returned. She feels tired and run down. She has no interest in any of her usual activities. Her motivation is low. Sleep is poor with frequent wakening. At the same time she denies any suicidal thoughts and denies any psychotic symptoms. Patient has been compliant with medicine. She does not report any clear new stress that could be making her feel worse. Patient is interested in considering having her return to at least a brief period of electroconvulsive therapy. She has no new physical complaints. Principal Problem: @PPROB @ Diagnosis:   Patient Active Problem List   Diagnosis Date Noted  . Depression, major, severe recurrence ([place]) [F33.2] 01/11/2015  . Depression, major, recurrent ([place]) [F33.9] 01/11/2015  . Arthritis [M19.90] 12/11/2014  . HLD (hyperlipidemia) [E78.5] 12/11/2014  . BP (high blood pressure) [I10] 05/21/2014  . Clinical depression [F32.9] 05/21/2014  . Hypokalemia [E87.6] 09/07/2013  . Prolonged QT interval [I45.81] 09/07/2013  . Anemia [D64.9] 09/07/2013  . Chest pain [R07.9] 09/06/2013  . Nonspecific (abnormal) findings on radiological and other examination of gastrointestinal tract [R93.3] 06/22/2013  . Left bundle branch block [I44.7] 06/09/2012  . TRANSAMINASES, SERUM, ELEVATED [R74.0] 11/28/2010  . CAD, NATIVE VESSEL [I25.10] 11/03/2010  . Acalcerosis [E83.50] 08/27/2010  . Urethral caruncle [N36.2] 05/07/2010  . PPM-Medtronic [Z95.0] 02/04/2010  . Calculus of kidney [N20.0] 12/16/2009  . Hematuria, microscopic [R31.29] 12/16/2009  . Renal colic 123XX123 123XX123  . CHEST PAIN, NON-CARDIAC [R07.89] 03/22/2009  . HYPERLIPIDEMIA-MIXED [E78.5] 02/22/2009  . Major depression ([place])  [F32.9] 02/22/2009  . Essential hypertension [I10] 02/22/2009  . Frequent PVCs [I49.3] 02/22/2009  . BRADYCARDIA [I49.8] 02/22/2009   Total Time spent with patient: 25  Past Psychiatric History: Patient has long history of depression going back decades which has responded well to ECT in the past. Patient was getting maintenance ECT for years but decided to discontinue to avoid side effects and indeed has been doing well for about 2 years now without ECT. She has a history of distant hospitalizations.  Past Medical History:  Past Medical History  Diagnosis Date  . Depression     a. h/o ECT  . Premature ventricular contraction     a. managed with propafenone.  . Hyperlipidemia, mixed   . Symptomatic bradycardia     a. s/p MDT PPM in 08/2005;  b. 02/2010 Gen change->MDT Adapta DC PPM, ser # [id number] H.  . HTN (hypertension)   . Nephrolithiasis   . Diverticulitis   . Memory problem     "states memory issues"  . CAD (coronary artery disease) Non-obstructive    a. 2004 Cath: nonobs dzs.  . Pancreatic cyst     a. [id number] Endoscopic U/S: nl UGI tract, 1-77mm pancreatic cysts, no masses/nodules.  . DDD (degenerative disc disease)     a. with chronic right sided back pain - improves after seeing chiropractor.    Past Surgical History  Procedure Laterality Date  . Appendectomy  1952  . Tumor excision      And nemamgeomas  . Cholecystectomy  1983  . Pacemaker insertion Left     Medtronic  . Eus N/A 06/22/2013    Procedure: UPPER ENDOSCOPIC ULTRASOUND (EUS) LINEAR;  Surgeon: Milus Banister, MD;  Location: [facility];  Service: Endoscopy;  Laterality: N/A;  . Cataract extraction, bilateral     Family History:  Family History  Problem Relation Age of Onset  . Stroke Father     cva after cea in his 43's, died @ 22.  . Other Mother     died @ 49 - old age.   Family Psychiatric  History: Positive for depression Social History:  History  Alcohol Use No     History  Drug Use No     Social History   Social History  . Marital Status: Widowed    Spouse Name: N/A  . Number of Children: N/A  . Years of Education: N/A   Occupational History  . Retired    Social History Main Topics  . Smoking status: Never Smoker   . Smokeless tobacco: None  . Alcohol Use: No  . Drug Use: No  . Sexual Activity: No   Other Topics Concern  . None   Social History Narrative   Lives in [place].  She does not routinely exercise.   Additional Social History:                         Sleep: Poor  Appetite:  Fair  Current Medications: Current Outpatient Prescriptions  Medication Sig Dispense Refill  . amLODipine (NORVASC) 5 MG tablet Take 5 mg by mouth every morning.     [name] buPROPion (WELLBUTRIN XL) 300 MG 24 hr tablet Take 1 tablet (300 mg total) by mouth every other day. 30 tablet 3  . cholecalciferol (VITAMIN D) 1000 UNITS tablet Take 2,000 Units by mouth daily.      . Coenzyme Q10 (COQ10) 150 MG CAPS Take 1 capsule by mouth daily.      . Cyanocobalamin (VITAMIN B 12 PO) Take 2 tablets by mouth daily.     . DULoxetine (CYMBALTA) 60 MG capsule Take 1 capsule (60 mg total) by mouth every other day. 30 capsule 3  . Glucosamine-Chondroit-Vit C-Mn (GLUCOSAMINE-CHONDROITIN) CAPS Take 1 capsule by mouth daily.     [name] ketoconazole (NIZORAL) 2 % shampoo APPLY TO AFFECTED AREA EVERY DAY  6  . KLOR-CON M10 10 MEQ tablet Take 10 mEq by mouth daily.  11  . LORazepam (ATIVAN) 0.5 MG tablet Take 1 tablet (0.5 mg total) by mouth every 8 (eight) hours as needed for anxiety. 30 tablet 0  . Melatonin 1 MG TABS Take 1 mg by mouth daily.    . Multiple Vitamin (MULTIVITAMIN) tablet Take 1 tablet by mouth daily.      . Multiple Vitamins-Minerals (PRESERVISION AREDS) TABS Take by mouth daily.    . polyethylene glycol (MIRALAX / GLYCOLAX) packet Take 17 g by mouth as needed for mild constipation.     . potassium chloride (K-DUR) 10 MEQ tablet Take 10 mEq by mouth daily.    . Probiotic  Product (PROBIOTIC DAILY PO) Take by mouth daily.    . Red Yeast Rice 600 MG CAPS Take 600 mg by mouth daily.    . Vitamin B Complex-C CAPS Take 1 capsule by mouth daily.       No current facility-administered medications for this visit.    Lab Results: No results found for this or any previous visit (from the past 48 hour(s)).  Physical Findings: AIMS:  , ,  ,  ,    CIWA:    COWS:     Musculoskeletal: Strength & Muscle Tone: within normal limits Gait & Station: normal  Patient leans: N/A  Psychiatric Specialty Exam: ROS  Blood pressure 142/90, pulse 53, temperature 97.3 F (36.3 C), temperature source Tympanic, height 5' 5.5" (1.664 m), weight 163 lb 9.6 oz (74.208 kg), SpO2 95 %.Body mass index is 26.8 kg/(m^2).  General Appearance: Casual  Eye Contact::  Fair  Speech:  Slow  Volume:  Decreased  Mood:  Depressed  Affect:  Depressed  Thought Process:  Circumstantial  Orientation:  Full (Time, Place, and Person)  Thought Content:  Negative  Suicidal Thoughts:  No  Homicidal Thoughts:  No  Memory:  Immediate;   Fair Recent;   Fair Remote;   Good  Judgement:  Fair  Insight:  Fair  Psychomotor Activity:  Psychomotor Retardation  Concentration:  Fair  Recall:  [place] of Knowledge:Fair  Language: Fair  Akathisia:  No  Handed:  Right  AIMS (if indicated):     Assets:  Desire for Improvement Financial Resources/Insurance Housing Physical Health Social Support Transportation  ADL's:  Intact  Cognition: WNL  Sleep:      Treatment Plan Summary: Medication management and Plan We reviewed her history and the patient does appear to have a return of depression. She has been compliant with her medicine. I agreed with her suggestion that we try ECT as she has tolerated it extremely well with good response in the past. I will have her put on the schedule for ECT this Friday. I have sent her to get all of the usual workup labs including chest x-ray EKG urine analysis and  basic blood tests. I have contacted the ECT Department. Meanwhile we will continue her current medicines of Wellbutrin and Cymbalta. Encouragement to the patient. No indication for hospitalization. We can follow-up in the office in 3 months about.  [name], MD 11/18/2015, 6:51 PM

## 2015-11-19 ENCOUNTER — Encounter
Admission: RE | Admit: 2015-11-19 | Discharge: 2015-11-19 | Disposition: A | Discharge status: Home / Self Care | Payer: PPO | Source: Ambulatory Visit | Attending: Psychiatry | Admitting: Psychiatry

## 2015-11-19 ENCOUNTER — Other Ambulatory Visit: Payer: Self-pay | Admitting: Psychiatry

## 2015-11-19 ENCOUNTER — Ambulatory Visit: Payer: PPO | Admitting: Psychiatry

## 2015-11-19 ENCOUNTER — Ambulatory Visit
Admission: RE | Admit: 2015-11-19 | Discharge: 2015-11-19 | Disposition: A | Discharge status: Home / Self Care | Payer: PPO | Source: Ambulatory Visit | Attending: Psychiatry | Admitting: Psychiatry

## 2015-11-19 DIAGNOSIS — F332 Major depressive disorder, recurrent severe without psychotic features: Secondary | ICD-10-CM | POA: Diagnosis not present

## 2015-11-19 DIAGNOSIS — I10 Essential (primary) hypertension: Secondary | ICD-10-CM | POA: Diagnosis not present

## 2015-11-19 DIAGNOSIS — I2510 Atherosclerotic heart disease of native coronary artery without angina pectoris: Secondary | ICD-10-CM

## 2015-11-19 DIAGNOSIS — I517 Cardiomegaly: Secondary | ICD-10-CM | POA: Insufficient documentation

## 2015-11-19 HISTORY — DX: Presence of cardiac pacemaker: Z95.0

## 2015-11-19 HISTORY — DX: Anxiety disorder, unspecified: F41.9

## 2015-11-19 LAB — CBC
HCT: 38.7 % (ref 35.0–47.0)
Hemoglobin: 12.7 g/dL (ref 12.0–16.0)
MCH: 29.2 pg (ref 26.0–34.0)
MCHC: 32.9 g/dL (ref 32.0–36.0)
MCV: 88.8 fL (ref 80.0–100.0)
PLATELETS: 190 10*3/uL (ref 150–440)
RBC: 4.36 MIL/uL (ref 3.80–5.20)
RDW: 14.7 % — ABNORMAL HIGH (ref 11.5–14.5)
WBC: 5.3 10*3/uL (ref 3.6–11.0)

## 2015-11-19 LAB — BASIC METABOLIC PANEL
Anion gap: 7 (ref 5–15)
BUN: 13 mg/dL (ref 6–20)
CO2: 31 mmol/L (ref 22–32)
CREATININE: 0.72 mg/dL (ref 0.44–1.00)
Calcium: 9 mg/dL (ref 8.9–10.3)
Chloride: 102 mmol/L (ref 101–111)
GFR calc Af Amer: >60 mL/min (ref >60)
Glucose, Bld: 106 mg/dL — ABNORMAL HIGH (ref 65–99)
Potassium: 3.1 mmol/L — ABNORMAL LOW (ref 3.5–5.1)
SODIUM: 140 mmol/L (ref 135–145)

## 2015-11-19 LAB — URINALYSIS COMPLETE WITH MICROSCOPIC (ARMC ONLY)
BACTERIA UA: NONE SEEN
Bilirubin Urine: NEGATIVE
Glucose, UA: NEGATIVE mg/dL
Hgb urine dipstick: NEGATIVE
Ketones, ur: NEGATIVE mg/dL
Leukocytes, UA: NEGATIVE
Nitrite: NEGATIVE
PROTEIN: NEGATIVE mg/dL
Specific Gravity, Urine: 1.013 (ref 1.005–1.030)
pH: 7 (ref 5.0–8.0)

## 2015-11-20 ENCOUNTER — Telehealth: Payer: Self-pay | Admitting: Psychiatry

## 2015-11-20 NOTE — Telephone Encounter (Signed)
I had followed up with her about her lab studies for ECT. We will recheck the potassium but she feels that should not be a problem because she simply missed her daily potassium supplement. I spoke with her outpatient cardiologist, Dr. Karl Bales who looked at her current EKG and feels that there is no contraindication to ECT. Asian is on scheduled for Friday. I will pass this along to anesthesia.

## 2015-11-22 ENCOUNTER — Encounter: Payer: Self-pay | Admitting: Anesthesiology

## 2015-11-22 ENCOUNTER — Encounter
Admission: RE | Admit: 2015-11-22 | Discharge: 2015-11-22 | Disposition: A | Discharge status: Home / Self Care | Payer: PPO | Source: Ambulatory Visit | Attending: Psychiatry | Admitting: Psychiatry

## 2015-11-22 DIAGNOSIS — Z9889 Other specified postprocedural states: Secondary | ICD-10-CM | POA: Diagnosis not present

## 2015-11-22 DIAGNOSIS — Z885 Allergy status to narcotic agent status: Secondary | ICD-10-CM | POA: Diagnosis not present

## 2015-11-22 DIAGNOSIS — Z85820 Personal history of malignant melanoma of skin: Secondary | ICD-10-CM | POA: Diagnosis not present

## 2015-11-22 DIAGNOSIS — I2510 Atherosclerotic heart disease of native coronary artery without angina pectoris: Secondary | ICD-10-CM | POA: Insufficient documentation

## 2015-11-22 DIAGNOSIS — Z888 Allergy status to other drugs, medicaments and biological substances status: Secondary | ICD-10-CM | POA: Insufficient documentation

## 2015-11-22 DIAGNOSIS — F332 Major depressive disorder, recurrent severe without psychotic features: Secondary | ICD-10-CM | POA: Insufficient documentation

## 2015-11-22 DIAGNOSIS — E782 Mixed hyperlipidemia: Secondary | ICD-10-CM | POA: Insufficient documentation

## 2015-11-22 DIAGNOSIS — Z85828 Personal history of other malignant neoplasm of skin: Secondary | ICD-10-CM | POA: Insufficient documentation

## 2015-11-22 DIAGNOSIS — I10 Essential (primary) hypertension: Secondary | ICD-10-CM | POA: Insufficient documentation

## 2015-11-22 DIAGNOSIS — Z87442 Personal history of urinary calculi: Secondary | ICD-10-CM | POA: Insufficient documentation

## 2015-11-22 DIAGNOSIS — Z950 Presence of cardiac pacemaker: Secondary | ICD-10-CM | POA: Insufficient documentation

## 2015-11-22 DIAGNOSIS — K862 Cyst of pancreas: Secondary | ICD-10-CM | POA: Insufficient documentation

## 2015-11-22 LAB — POCT I-STAT 4, (NA,K, GLUC, HGB,HCT)
GLUCOSE: 99 mg/dL (ref 65–99)
HEMATOCRIT: 39 % (ref 36.0–46.0)
HEMOGLOBIN: 13.3 g/dL (ref 12.0–15.0)
POTASSIUM: 3.8 mmol/L (ref 3.5–5.1)
Sodium: 140 mmol/L (ref 135–145)

## 2015-11-22 MED ORDER — SODIUM CHLORIDE 0.9 % IV SOLN
250.0000 mL | Freq: Once | INTRAVENOUS | Status: AC
  Administered 2015-11-22: 500 mL via INTRAVENOUS

## 2015-11-22 MED ORDER — SUCCINYLCHOLINE CHLORIDE 20 MG/ML IJ SOLN
INTRAMUSCULAR | Status: DC | PRN
  Administered 2015-11-22: 90 mg via INTRAVENOUS

## 2015-11-22 MED ORDER — KETOROLAC TROMETHAMINE 30 MG/ML IJ SOLN
30.0000 mg | Freq: Once | INTRAMUSCULAR | Status: AC
  Administered 2015-11-22: 30 mg via INTRAVENOUS

## 2015-11-22 MED ORDER — SODIUM CHLORIDE 0.9 % IV SOLN
INTRAVENOUS | Status: DC | PRN
  Administered 2015-11-22: 10:00:00 via INTRAVENOUS

## 2015-11-22 MED ORDER — METHOHEXITAL SODIUM 100 MG/10ML IV SOSY
PREFILLED_SYRINGE | INTRAVENOUS | Status: DC | PRN
  Administered 2015-11-22: 80 mg via INTRAVENOUS

## 2015-11-22 NOTE — Procedures (Signed)
ECT SERVICES Physician's Interval Evaluation & Treatment Note  Patient Identification: LEYTON STRAIN MRN:  [id number] Date of Evaluation:  11/22/2015 TX #: 1  MADRS: 24  MMSE: 29  P.E. Findings:  Blood pressure slightly elevated today otherwise no significant findings lungs clear heart normal rhythm  Psychiatric Interval Note:  Mood is returned to depressed withdrawn and lack of interest and motivation  Subjective:  Patient is a 82 y.o. female seen for evaluation for Electroconvulsive Therapy. Depressed  Treatment Summary:   []   Right Unilateral             [x]  Bilateral   % Energy : 1.0 ms 75%   Impedance: 1250 ohms  Seizure Energy Index: 3191 V squared  Postictal Suppression Index: 67%  Seizure Concordance Index: 87%  Medications  Pre Shock: Toradol 30 mg, Brevital 80 mg, succinylcholine 90 mg  Post Shock:    Seizure Duration: 24 seconds by EMG 63 seconds by EEG   Comments: Appears to of tolerated treatment well. We discussed options for doing this short series and she agrees to come back for scheduled treatment on Monday the 20th.   Lungs:  [x]   Clear to auscultation               []  Other:   Heart:    [x]   Regular rhythm             []  irregular rhythm    [x]   Previous H&P reviewed, patient examined and there are NO CHANGES                 []   Previous H&P reviewed, patient examined and there are changes noted.   [name], MD 2/17/201710:36 AM

## 2015-11-22 NOTE — H&P (Signed)
Barbara Marks is an 82 y.o. female.   Chief Complaint: Patient has had a return of depression. Fatigue run down negative feeling. No active suicidal thoughts no psychosis. HPI: Patient with a history of recurrent severe depression who had been stable without ECT for the last couple years has returned with a new episode of depression requesting ECT at least briefly  Past Medical History  Diagnosis Date  . Depression     a. h/o ECT  . Premature ventricular contraction     a. managed with propafenone.  . Hyperlipidemia, mixed   . Symptomatic bradycardia     a. s/p MDT PPM in 08/2005;  b. 02/2010 Gen change->MDT Adapta DC PPM, ser # [id number] H.  . HTN (hypertension)   . Nephrolithiasis   . Diverticulitis   . Memory problem     "states memory issues"  . CAD (coronary artery disease) Non-obstructive    a. 2004 Cath: nonobs dzs.  . Pancreatic cyst     a. [id number] Endoscopic U/S: nl UGI tract, 1-33mm pancreatic cysts, no masses/nodules.  . DDD (degenerative disc disease)     a. with chronic right sided back pain - improves after seeing chiropractor.  [name] Anxiety   . Cancer ([place])     melanoma skin cancer  . Presence of permanent cardiac pacemaker     Past Surgical History  Procedure Laterality Date  . Appendectomy  1952  . Tumor excision      And nemamgeomas  . Cholecystectomy  1983  . Pacemaker insertion Left     Medtronic  . Eus N/A 06/22/2013    Procedure: UPPER ENDOSCOPIC ULTRASOUND (EUS) LINEAR;  Surgeon: Milus Banister, MD;  Location: [facility];  Service: Endoscopy;  Laterality: N/A;  . Cataract extraction, bilateral    . Eye surgery Bilateral     Cataract Extraction with IOL    Family History  Problem Relation Age of Onset  . Stroke Father     cva after cea in his 53's, died @ 66.  . Other Mother     died @ 39 - old age.   Social History:  reports that she has never smoked. She has never used smokeless tobacco. She reports that she does not drink alcohol or use illicit  drugs.  Allergies:  Allergies  Allergen Reactions  . Codeine     unknown  . Simvastatin Other (See Comments)    "leg cramps"     (Not in a hospital admission)  Results for orders placed or performed during the hospital encounter of 11/22/15 (from the past 48 hour(s))  I-STAT 4, (NA,K, GLUC, HGB,HCT)     Status: None   Collection Time: 11/22/15  9:48 AM  Result Value Ref Range   Sodium 140 135 - 145 mmol/L   Potassium 3.8 3.5 - 5.1 mmol/L   Glucose, Bld 99 65 - 99 mg/dL   HCT 39.0 36.0 - 46.0 %   Hemoglobin 13.3 12.0 - 15.0 g/dL   No results found.  Review of Systems  Constitutional: Positive for weight loss and malaise/fatigue.  HENT: Negative.   Eyes: Negative.   Respiratory: Negative.   Cardiovascular: Negative.   Gastrointestinal: Negative.   Musculoskeletal: Negative.   Skin: Negative.   Neurological: Negative.   Psychiatric/Behavioral: Positive for depression and memory loss. Negative for suicidal ideas, hallucinations and substance abuse. The patient is nervous/anxious and has insomnia.     Blood pressure 160/64, pulse 80, temperature 97.1 F (36.2 C), temperature source Oral, resp. rate  16, weight 73.029 kg (161 lb), SpO2 100 %. Physical Exam  Nursing note and vitals reviewed. Constitutional: She appears well-developed and well-nourished.  HENT:  Head: Normocephalic and atraumatic.  Eyes: Conjunctivae are normal. Pupils are equal, round, and reactive to light.  Neck: Normal range of motion.  Cardiovascular: Normal rate, regular rhythm and normal heart sounds.   Respiratory: Effort normal and breath sounds normal. No respiratory distress.  GI: Soft.  Musculoskeletal: Normal range of motion.  Neurological: She is alert.  Skin: Skin is warm and dry.  Psychiatric: Judgment normal. Her affect is blunt. Her speech is delayed. She is slowed. She exhibits a depressed mood. She exhibits abnormal recent memory.     Assessment/Plan Bilateral treatment today we  will schedule her for Monday as well and then assess the improvement.  Barbara Berthold, MD 11/22/2015, 10:34 AM

## 2015-11-22 NOTE — Anesthesia Procedure Notes (Signed)
Date/Time: 11/22/2015 10:41 AM Performed by: Johnna Acosta Pre-anesthesia Checklist: Patient identified, Emergency Drugs available, Suction available, Patient being monitored and Timeout performed Oxygen Delivery Method: Ambu bag

## 2015-11-22 NOTE — Anesthesia Preprocedure Evaluation (Signed)
Anesthesia Evaluation  Patient identified by MRN, date of birth, ID band Patient awake    Reviewed: Allergy & Precautions, H&P , NPO status , Patient's Chart, lab work & pertinent test results, reviewed documented beta blocker date and time   History of Anesthesia Complications Negative for: history of anesthetic complications  Airway Mallampati: II  TM Distance: >3 FB Neck ROM: full    Dental no notable dental hx. (+) Edentulous Upper, Partial Lower   Pulmonary neg pulmonary ROS,    Pulmonary exam normal breath sounds clear to auscultation       Cardiovascular Exercise Tolerance: Good hypertension, On Medications (-) angina+ CAD  (-) Past MI, (-) Cardiac Stents and (-) CABG Normal cardiovascular exam+ dysrhythmias (PVCs) + pacemaker (-) Valvular Problems/Murmurs Rhythm:regular Rate:Normal     Neuro/Psych PSYCHIATRIC DISORDERS (Depression) negative neurological ROS     GI/Hepatic negative GI ROS, Neg liver ROS,   Endo/Other  negative endocrine ROS  Renal/GU Renal disease (kidney stones)  negative genitourinary   Musculoskeletal   Abdominal   Peds  Hematology negative hematology ROS (+)   Anesthesia Other Findings Past Medical History:   Depression                                                     Comment:a. h/o ECT   Premature ventricular contraction                              Comment:a. managed with propafenone.   Hyperlipidemia, mixed                                        Symptomatic bradycardia                                        Comment:a. s/p MDT PPM in 08/2005;  b. 02/2010 Gen               change->MDT Adapta DC PPM, ser # [id number] H.   HTN (hypertension)                                           Nephrolithiasis                                              Diverticulitis                                               Memory problem                                                 Comment:"states  memory issues"  CAD (coronary artery disease)                   Non-obstr*     Comment:a. 2004 Cath: nonobs dzs.   Pancreatic cyst                                                Comment:a. [id number] Endoscopic U/S: nl UGI tract, 1-65mm               pancreatic cysts, no masses/nodules.   DDD (degenerative disc disease)                                Comment:a. with chronic right sided back pain -               improves after seeing chiropractor.   Anxiety                                                      Cancer ([place])                                                   Comment:melanoma skin cancer   Presence of permanent cardiac pacemaker                      Reproductive/Obstetrics negative OB ROS                             Anesthesia Physical Anesthesia Plan  ASA: II  Anesthesia Plan: General   Post-op Pain Management:    Induction:   Airway Management Planned:   Additional Equipment:   Intra-op Plan:   Post-operative Plan:   Informed Consent: I have reviewed the patients History and Physical, chart, labs and discussed the procedure including the risks, benefits and alternatives for the proposed anesthesia with the patient or authorized representative who has indicated his/her understanding and acceptance.   Dental Advisory Given  Plan Discussed with: Anesthesiologist, CRNA and Surgeon  Anesthesia Plan Comments:         Anesthesia Quick Evaluation

## 2015-11-22 NOTE — Transfer of Care (Signed)
Immediate Anesthesia Transfer of Care Note  Patient: Barbara Marks  Procedure(s) Performed: * No procedures listed *  Patient Location: PACU  Anesthesia Type:General  Level of Consciousness: sedated  Airway & Oxygen Therapy: Patient Spontanous Breathing and Patient connected to face mask oxygen  Post-op Assessment: Report given to RN and Post -op Vital signs reviewed and stable  Post vital signs: Reviewed  Last Vitals:  Filed Vitals:   11/22/15 0944 11/22/15 1051  BP: 160/64 152/73  Pulse: 80 77  Temp: 36.2 C 37 C  Resp: 16 16    Complications: No apparent anesthesia complications

## 2015-11-22 NOTE — Anesthesia Postprocedure Evaluation (Signed)
Anesthesia Post Note  Patient: Barbara Marks  Procedure(s) Performed: * No procedures listed *  Patient location during evaluation: PACU Anesthesia Type: General Level of consciousness: awake and alert Pain management: pain level controlled Vital Signs Assessment: post-procedure vital signs reviewed and stable Respiratory status: spontaneous breathing, nonlabored ventilation, respiratory function stable and patient connected to nasal cannula oxygen Cardiovascular status: blood pressure returned to baseline and stable Postop Assessment: no signs of nausea or vomiting Anesthetic complications: no    Last Vitals:  Filed Vitals:   11/22/15 1118 11/22/15 1125  BP: 164/64 155/67  Pulse: 62 66  Temp:    Resp: 19 16    Last Pain: There were no vitals filed for this visit.               Martha Clan

## 2015-11-25 ENCOUNTER — Encounter: Payer: Self-pay | Admitting: Certified Registered Nurse Anesthetist

## 2015-11-25 ENCOUNTER — Encounter: Admission: RE | Admit: 2015-11-25 | Payer: PPO | Source: Ambulatory Visit

## 2015-11-28 DIAGNOSIS — H26493 Other secondary cataract, bilateral: Secondary | ICD-10-CM | POA: Diagnosis not present

## 2015-12-17 DIAGNOSIS — M5416 Radiculopathy, lumbar region: Secondary | ICD-10-CM | POA: Diagnosis not present

## 2015-12-17 DIAGNOSIS — M9903 Segmental and somatic dysfunction of lumbar region: Secondary | ICD-10-CM | POA: Diagnosis not present

## 2015-12-17 DIAGNOSIS — M5033 Other cervical disc degeneration, cervicothoracic region: Secondary | ICD-10-CM | POA: Diagnosis not present

## 2015-12-17 DIAGNOSIS — M9901 Segmental and somatic dysfunction of cervical region: Secondary | ICD-10-CM | POA: Diagnosis not present

## 2016-01-14 DIAGNOSIS — M5033 Other cervical disc degeneration, cervicothoracic region: Secondary | ICD-10-CM | POA: Diagnosis not present

## 2016-01-14 DIAGNOSIS — M9901 Segmental and somatic dysfunction of cervical region: Secondary | ICD-10-CM | POA: Diagnosis not present

## 2016-01-14 DIAGNOSIS — M5416 Radiculopathy, lumbar region: Secondary | ICD-10-CM | POA: Diagnosis not present

## 2016-01-14 DIAGNOSIS — M9903 Segmental and somatic dysfunction of lumbar region: Secondary | ICD-10-CM | POA: Diagnosis not present

## 2016-01-21 ENCOUNTER — Other Ambulatory Visit: Payer: Self-pay | Admitting: Gastroenterology

## 2016-01-21 DIAGNOSIS — R1900 Intra-abdominal and pelvic swelling, mass and lump, unspecified site: Secondary | ICD-10-CM

## 2016-01-21 DIAGNOSIS — K862 Cyst of pancreas: Secondary | ICD-10-CM | POA: Diagnosis not present

## 2016-01-27 ENCOUNTER — Ambulatory Visit: Payer: PPO

## 2016-01-27 ENCOUNTER — Ambulatory Visit
Admission: RE | Admit: 2016-01-27 | Discharge: 2016-01-27 | Disposition: A | Discharge status: Home / Self Care | Payer: PPO | Source: Ambulatory Visit | Attending: Gastroenterology | Admitting: Gastroenterology

## 2016-01-27 DIAGNOSIS — N281 Cyst of kidney, acquired: Secondary | ICD-10-CM | POA: Insufficient documentation

## 2016-01-27 DIAGNOSIS — Z9049 Acquired absence of other specified parts of digestive tract: Secondary | ICD-10-CM | POA: Diagnosis not present

## 2016-01-27 DIAGNOSIS — R1900 Intra-abdominal and pelvic swelling, mass and lump, unspecified site: Secondary | ICD-10-CM | POA: Diagnosis not present

## 2016-02-25 DIAGNOSIS — M9903 Segmental and somatic dysfunction of lumbar region: Secondary | ICD-10-CM | POA: Diagnosis not present

## 2016-02-25 DIAGNOSIS — M9901 Segmental and somatic dysfunction of cervical region: Secondary | ICD-10-CM | POA: Diagnosis not present

## 2016-02-25 DIAGNOSIS — M5033 Other cervical disc degeneration, cervicothoracic region: Secondary | ICD-10-CM | POA: Diagnosis not present

## 2016-02-25 DIAGNOSIS — M5416 Radiculopathy, lumbar region: Secondary | ICD-10-CM | POA: Diagnosis not present

## 2016-03-31 DIAGNOSIS — M9903 Segmental and somatic dysfunction of lumbar region: Secondary | ICD-10-CM | POA: Diagnosis not present

## 2016-03-31 DIAGNOSIS — M5033 Other cervical disc degeneration, cervicothoracic region: Secondary | ICD-10-CM | POA: Diagnosis not present

## 2016-03-31 DIAGNOSIS — M5416 Radiculopathy, lumbar region: Secondary | ICD-10-CM | POA: Diagnosis not present

## 2016-03-31 DIAGNOSIS — M9901 Segmental and somatic dysfunction of cervical region: Secondary | ICD-10-CM | POA: Diagnosis not present

## 2016-04-10 DIAGNOSIS — R030 Elevated blood-pressure reading, without diagnosis of hypertension: Secondary | ICD-10-CM | POA: Diagnosis not present

## 2016-04-10 DIAGNOSIS — H60501 Unspecified acute noninfective otitis externa, right ear: Secondary | ICD-10-CM | POA: Diagnosis not present

## 2016-04-16 DIAGNOSIS — H902 Conductive hearing loss, unspecified: Secondary | ICD-10-CM | POA: Diagnosis not present

## 2016-04-16 DIAGNOSIS — H6121 Impacted cerumen, right ear: Secondary | ICD-10-CM | POA: Diagnosis not present

## 2016-04-23 DIAGNOSIS — D485 Neoplasm of uncertain behavior of skin: Secondary | ICD-10-CM | POA: Diagnosis not present

## 2016-04-23 DIAGNOSIS — L239 Allergic contact dermatitis, unspecified cause: Secondary | ICD-10-CM | POA: Diagnosis not present

## 2016-04-30 DIAGNOSIS — D485 Neoplasm of uncertain behavior of skin: Secondary | ICD-10-CM | POA: Diagnosis not present

## 2016-04-30 DIAGNOSIS — C44622 Squamous cell carcinoma of skin of right upper limb, including shoulder: Secondary | ICD-10-CM | POA: Diagnosis not present

## 2016-04-30 DIAGNOSIS — L239 Allergic contact dermatitis, unspecified cause: Secondary | ICD-10-CM | POA: Diagnosis not present

## 2016-05-12 DIAGNOSIS — M5033 Other cervical disc degeneration, cervicothoracic region: Secondary | ICD-10-CM | POA: Diagnosis not present

## 2016-05-12 DIAGNOSIS — M9901 Segmental and somatic dysfunction of cervical region: Secondary | ICD-10-CM | POA: Diagnosis not present

## 2016-05-12 DIAGNOSIS — M5416 Radiculopathy, lumbar region: Secondary | ICD-10-CM | POA: Diagnosis not present

## 2016-05-12 DIAGNOSIS — M9903 Segmental and somatic dysfunction of lumbar region: Secondary | ICD-10-CM | POA: Diagnosis not present

## 2016-06-09 DIAGNOSIS — L570 Actinic keratosis: Secondary | ICD-10-CM | POA: Diagnosis not present

## 2016-06-09 DIAGNOSIS — Z85828 Personal history of other malignant neoplasm of skin: Secondary | ICD-10-CM | POA: Diagnosis not present

## 2016-06-09 DIAGNOSIS — Z85820 Personal history of malignant melanoma of skin: Secondary | ICD-10-CM | POA: Diagnosis not present

## 2016-06-09 DIAGNOSIS — L821 Other seborrheic keratosis: Secondary | ICD-10-CM | POA: Diagnosis not present

## 2016-06-09 DIAGNOSIS — L309 Dermatitis, unspecified: Secondary | ICD-10-CM | POA: Diagnosis not present

## 2016-06-25 DIAGNOSIS — H26493 Other secondary cataract, bilateral: Secondary | ICD-10-CM | POA: Diagnosis not present

## 2016-06-26 DIAGNOSIS — L821 Other seborrheic keratosis: Secondary | ICD-10-CM | POA: Diagnosis not present

## 2016-06-26 DIAGNOSIS — L820 Inflamed seborrheic keratosis: Secondary | ICD-10-CM | POA: Diagnosis not present

## 2016-06-26 DIAGNOSIS — Z85828 Personal history of other malignant neoplasm of skin: Secondary | ICD-10-CM | POA: Diagnosis not present

## 2016-06-26 DIAGNOSIS — L578 Other skin changes due to chronic exposure to nonionizing radiation: Secondary | ICD-10-CM | POA: Diagnosis not present

## 2016-07-02 ENCOUNTER — Telehealth: Payer: Self-pay | Admitting: Psychiatry

## 2016-07-02 NOTE — Telephone Encounter (Signed)
I returned her call. We spoke. Plan for ECT for Monday.

## 2016-07-03 ENCOUNTER — Telehealth: Payer: Self-pay | Admitting: *Deleted

## 2016-07-05 ENCOUNTER — Other Ambulatory Visit: Payer: Self-pay | Admitting: Psychiatry

## 2016-07-06 ENCOUNTER — Encounter: Payer: Self-pay | Admitting: Anesthesiology

## 2016-07-06 ENCOUNTER — Encounter
Admission: RE | Admit: 2016-07-06 | Discharge: 2016-07-06 | Disposition: A | Discharge status: Home / Self Care | Payer: PPO | Source: Ambulatory Visit | Attending: Psychiatry | Admitting: Psychiatry

## 2016-07-06 DIAGNOSIS — D649 Anemia, unspecified: Secondary | ICD-10-CM | POA: Diagnosis not present

## 2016-07-06 DIAGNOSIS — Z9841 Cataract extraction status, right eye: Secondary | ICD-10-CM | POA: Insufficient documentation

## 2016-07-06 DIAGNOSIS — Z823 Family history of stroke: Secondary | ICD-10-CM | POA: Diagnosis not present

## 2016-07-06 DIAGNOSIS — Z9842 Cataract extraction status, left eye: Secondary | ICD-10-CM | POA: Diagnosis not present

## 2016-07-06 DIAGNOSIS — I10 Essential (primary) hypertension: Secondary | ICD-10-CM | POA: Diagnosis not present

## 2016-07-06 DIAGNOSIS — Z9049 Acquired absence of other specified parts of digestive tract: Secondary | ICD-10-CM | POA: Insufficient documentation

## 2016-07-06 DIAGNOSIS — Z87442 Personal history of urinary calculi: Secondary | ICD-10-CM | POA: Insufficient documentation

## 2016-07-06 DIAGNOSIS — F332 Major depressive disorder, recurrent severe without psychotic features: Secondary | ICD-10-CM

## 2016-07-06 DIAGNOSIS — I2510 Atherosclerotic heart disease of native coronary artery without angina pectoris: Secondary | ICD-10-CM | POA: Insufficient documentation

## 2016-07-06 DIAGNOSIS — Z888 Allergy status to other drugs, medicaments and biological substances status: Secondary | ICD-10-CM | POA: Diagnosis not present

## 2016-07-06 DIAGNOSIS — Z85820 Personal history of malignant melanoma of skin: Secondary | ICD-10-CM | POA: Insufficient documentation

## 2016-07-06 DIAGNOSIS — E782 Mixed hyperlipidemia: Secondary | ICD-10-CM | POA: Diagnosis not present

## 2016-07-06 DIAGNOSIS — Z950 Presence of cardiac pacemaker: Secondary | ICD-10-CM | POA: Insufficient documentation

## 2016-07-06 DIAGNOSIS — F329 Major depressive disorder, single episode, unspecified: Secondary | ICD-10-CM | POA: Diagnosis not present

## 2016-07-06 DIAGNOSIS — Z885 Allergy status to narcotic agent status: Secondary | ICD-10-CM | POA: Diagnosis not present

## 2016-07-06 LAB — COMPREHENSIVE METABOLIC PANEL
ALBUMIN: 3.7 g/dL (ref 3.5–5.0)
ALK PHOS: 54 U/L (ref 38–126)
ALT: 16 U/L (ref 14–54)
ANION GAP: 8 (ref 5–15)
AST: 23 U/L (ref 15–41)
BILIRUBIN TOTAL: 0.8 mg/dL (ref 0.3–1.2)
BUN: 12 mg/dL (ref 6–20)
CALCIUM: 9.2 mg/dL (ref 8.9–10.3)
CO2: 29 mmol/L (ref 22–32)
CREATININE: 0.72 mg/dL (ref 0.44–1.00)
Chloride: 104 mmol/L (ref 101–111)
GFR calc Af Amer: >60 mL/min (ref >60)
GFR calc non Af Amer: >60 mL/min (ref >60)
GLUCOSE: 102 mg/dL — AB (ref 65–99)
Potassium: 2.9 mmol/L — ABNORMAL LOW (ref 3.5–5.1)
Sodium: 141 mmol/L (ref 135–145)
TOTAL PROTEIN: 6.8 g/dL (ref 6.5–8.1)

## 2016-07-06 MED ORDER — POTASSIUM CHLORIDE CRYS ER 20 MEQ PO TBCR
20.0000 meq | EXTENDED_RELEASE_TABLET | ORAL | Status: AC
  Administered 2016-07-06: 20 meq via ORAL
  Filled 2016-07-06: qty 1

## 2016-07-06 MED ORDER — POTASSIUM CHLORIDE ER 20 MEQ PO TBCR: 20.0000 meq | EXTENDED_RELEASE_TABLET | Freq: Every day | ORAL | 0 refills | Status: DC

## 2016-07-06 MED ORDER — SODIUM CHLORIDE 0.9 % IV SOLN
500.0000 mL | Freq: Once | INTRAVENOUS | Status: AC
  Administered 2016-07-06: 500 mL via INTRAVENOUS

## 2016-07-06 MED ORDER — SODIUM CHLORIDE 0.9 % IV SOLN
INTRAVENOUS | Status: DC | PRN
  Administered 2016-07-06: 11:00:00 via INTRAVENOUS

## 2016-07-06 MED ORDER — KETOROLAC TROMETHAMINE 30 MG/ML IJ SOLN
INTRAMUSCULAR | Status: AC
  Administered 2016-07-06: 30 mg via INTRAVENOUS
  Filled 2016-07-06: qty 1

## 2016-07-06 MED ORDER — METHOHEXITAL SODIUM 100 MG/10ML IV SOSY
PREFILLED_SYRINGE | INTRAVENOUS | Status: DC | PRN
  Administered 2016-07-06: 80 mg via INTRAVENOUS

## 2016-07-06 MED ORDER — FENTANYL CITRATE (PF) 100 MCG/2ML IJ SOLN: 25.0000 ug | INTRAMUSCULAR | Status: DC | PRN

## 2016-07-06 MED ORDER — KETOROLAC TROMETHAMINE 30 MG/ML IJ SOLN
30.0000 mg | Freq: Once | INTRAMUSCULAR | Status: AC
  Administered 2016-07-06: 30 mg via INTRAVENOUS

## 2016-07-06 MED ORDER — ONDANSETRON HCL 4 MG/2ML IJ SOLN: 4.0000 mg | Freq: Once | INTRAMUSCULAR | Status: DC | PRN

## 2016-07-06 MED ORDER — SUCCINYLCHOLINE CHLORIDE 20 MG/ML IJ SOLN
INTRAMUSCULAR | Status: DC | PRN
  Administered 2016-07-06: 90 mg via INTRAVENOUS

## 2016-07-06 NOTE — Procedures (Signed)
ECT SERVICES Physician's Interval Evaluation & Treatment Note  Patient Identification: Barbara Marks MRN:  [id number] Date of Evaluation:  07/06/2016 TX #: 1  MADRS:   MMSE:   P.E. Findings:  No new findings. Blood pressure was a little bit up pretreatment. Heart and lungs normal.  Psychiatric Interval Note:  Feeling more depressed. No psychosis or suicidal ideation. Energy low motivation low.  Subjective:  Patient is a 82 y.o. female seen for evaluation for Electroconvulsive Therapy. Patient is aware that she is feeling more depressed. Has been going on for maybe a couple weeks.  Treatment Summary:   []   Right Unilateral             [x]  Bilateral   % Energy : 1.0 ms, 75%   Impedance: 2510 ohms  Seizure Energy Index: 3700 V squared  Postictal Suppression Index: 24%  Seizure Concordance Index: 91%  Medications  Pre Shock: Toradol 30 mg, Brevital 80 mg, succinylcholine 90 mg  Post Shock:    Seizure Duration: 45 seconds by EMG, 72 seconds by EEG   Comments: We are going to put her in for Wednesday treatment and then go by her response as to how many more treatment she will need this cycle.   Lungs:  [x]   Clear to auscultation               []  Other:   Heart:    [x]   Regular rhythm             []  irregular rhythm    [x]   Previous H&P reviewed, patient examined and there are NO CHANGES                 []   Previous H&P reviewed, patient examined and there are changes noted.   [name], MD 10/2/201710:46 AM

## 2016-07-06 NOTE — Transfer of Care (Signed)
Immediate Anesthesia Transfer of Care Note  Patient: Barbara Marks  Procedure(s) Performed: * No procedures listed *  Patient Location: PACU  Anesthesia Type:General  Level of Consciousness: sedated  Airway & Oxygen Therapy: Patient Spontanous Breathing and Patient connected to face mask oxygen  Post-op Assessment: Report given to RN and Post -op Vital signs reviewed and stable  Post vital signs: Reviewed and stable  Last Vitals:  Vitals:   07/06/16 0840 07/06/16 1100  BP: (!) 184/72 (!) 190/76  Pulse: (!) 43 (!) 38  Resp: 17 16  Temp: 36.8 C 0000000 C    Complications: No apparent anesthesia complications

## 2016-07-06 NOTE — Anesthesia Postprocedure Evaluation (Signed)
Anesthesia Post Note  Patient: Barbara Marks  Procedure(s) Performed: * No procedures listed *  Patient location during evaluation: PACU Anesthesia Type: General Level of consciousness: awake and alert Pain management: pain level controlled Vital Signs Assessment: post-procedure vital signs reviewed and stable Respiratory status: spontaneous breathing, nonlabored ventilation and respiratory function stable Cardiovascular status: blood pressure returned to baseline and stable Postop Assessment: no signs of nausea or vomiting Anesthetic complications: no    Last Vitals:  Vitals:   07/06/16 1130 07/06/16 1141  BP: (!) 166/89 (!) 165/95  Pulse: 82 86  Resp: 17 17  Temp: 36.7 C     Last Pain:  Vitals:   07/06/16 1110  TempSrc:   PainSc: 0-No pain                 Treana Lacour

## 2016-07-06 NOTE — H&P (Signed)
Barbara Marks is an 82 y.o. female.   Chief Complaint: Return of depression no suicidal ideation no psychosis. Fatigue lack of interest and motivation. HPI: History of recurrent severe depression intermittently requiring boost her ECT  Past Medical History:  Diagnosis Date  . Anxiety   . CAD (coronary artery disease) Non-obstructive   a. 2004 Cath: nonobs dzs.  . Cancer ([place])    melanoma skin cancer  . DDD (degenerative disc disease)    a. with chronic right sided back pain - improves after seeing chiropractor.  . Depression    a. h/o ECT  . Diverticulitis   . HTN (hypertension)   . Hyperlipidemia, mixed   . Memory problem    "states memory issues"  . Nephrolithiasis   . Pancreatic cyst    a. [id number] Endoscopic U/S: nl UGI tract, 1-69mm pancreatic cysts, no masses/nodules.  . Premature ventricular contraction    a. managed with propafenone.  . Presence of permanent cardiac pacemaker   . Symptomatic bradycardia    a. s/p MDT PPM in 08/2005;  b. 02/2010 Gen change->MDT Adapta DC PPM, ser # [id number] H.    Past Surgical History:  Procedure Laterality Date  . APPENDECTOMY  1952  . CATARACT EXTRACTION, BILATERAL    . CHOLECYSTECTOMY  1983  . EUS N/A 06/22/2013   Procedure: UPPER ENDOSCOPIC ULTRASOUND (EUS) LINEAR;  Surgeon: Milus Banister, MD;  Location: [facility];  Service: Endoscopy;  Laterality: N/A;  . EYE SURGERY Bilateral    Cataract Extraction with IOL  . PACEMAKER INSERTION Left    [employer]  . TUMOR EXCISION     And nemamgeomas    Family History  Problem Relation Age of Onset  . Other Mother     died @ 50 - old age.  . Stroke Father     cva after cea in his 14's, died @ 89.   Social History:  reports that she has never smoked. She has never used smokeless tobacco. She reports that she does not drink alcohol or use drugs.  Allergies:  Allergies  Allergen Reactions  . Codeine     unknown  . Simvastatin Other (See Comments)    "leg cramps"     (Not in  a hospital admission)  No results found for this or any previous visit (from the past 48 hour(s)). No results found.  Review of Systems  Constitutional: Positive for malaise/fatigue.  HENT: Negative.   Eyes: Negative.   Respiratory: Negative.   Cardiovascular: Negative.   Gastrointestinal: Negative.   Musculoskeletal: Negative.   Skin: Negative.   Neurological: Negative.   Psychiatric/Behavioral: Positive for depression. Negative for hallucinations, memory loss, substance abuse and suicidal ideas. The patient is nervous/anxious. The patient does not have insomnia.     Blood pressure (!) 184/72, pulse (!) 43, temperature 98.2 F (36.8 C), temperature source Oral, resp. rate 17, weight 70.3 kg (155 lb), SpO2 100 %. Physical Exam  Nursing note and vitals reviewed. Constitutional: She appears well-developed and well-nourished.  HENT:  Head: Normocephalic and atraumatic.  Eyes: Conjunctivae are normal. Pupils are equal, round, and reactive to light.  Neck: Normal range of motion.  Cardiovascular: Regular rhythm and normal heart sounds.   Respiratory: Effort normal. No respiratory distress.  GI: Soft.  Musculoskeletal: Normal range of motion.  Neurological: She is alert.  Skin: Skin is warm and dry.  Psychiatric: Her speech is normal. Judgment normal. She is slowed. Cognition and memory are normal. She exhibits a depressed mood. She expresses  no suicidal ideation.     Assessment/Plan Bilateral ECT treatment and scheduled for Wednesday with follow-up as needed.  [name], MD 07/06/2016, 10:45 AM

## 2016-07-06 NOTE — Discharge Instructions (Signed)
1)  The drugs that you have been given will stay in your system until tomorrow so for the       next 24 hours you should not:  A. Drive an automobile  B. Make any legal decisions  C. Drink any alcoholic beverages  2)  You may resume your regular meals upon return home.  3)  A responsible adult must take you home.  Someone should stay with you for a few          hours, then be available by phone for the remainder of the treatment day.  4)  You May experience any of the following symptoms:  Headache, Nausea and a dry mouth (due to the medications you were given),  temporary memory loss and some confusion, or sore muscles (a warm bath  should help this).  If you you experience any of these symptoms let us know on                your return visit.  5)  Report any of the following: any acute discomfort, severe headache, or temperature        greater than 100.5 F.   Also report any unusual redness, swelling, drainage, or pain         at your IV site.    You may report Symptoms to:  [place] [facility]          Phone: [phone], ECT Department           or Dr. Prescott Gum office [phone]  6)  Your next ECT Treatment is Day Wednesday  Date October 4,2017  We will call 2 days prior to your scheduled appointment for arrival times.  7)  Nothing to eat or drink after midnight the night before your procedure.  8)  Take NORVASC  With a sip of water the morning of your procedure.  9)  Other Instructions: Call [phone] to cancel the morning of your procedure due         to illness or emergency.  10) We will call within 72 hours to assess how you are feeling.

## 2016-07-06 NOTE — Anesthesia Preprocedure Evaluation (Signed)
Anesthesia Evaluation  Patient identified by MRN, date of birth, ID band Patient awake    Reviewed: Allergy & Precautions, H&P , NPO status , Patient's Chart, lab work & pertinent test results, reviewed documented beta blocker date and time   Airway Mallampati: II   Neck ROM: full    Dental  (+) Poor Dentition   Pulmonary neg pulmonary ROS,    Pulmonary exam normal        Cardiovascular hypertension, + CAD  negative cardio ROS Normal cardiovascular exam+ dysrhythmias + pacemaker  Rate:Normal     Neuro/Psych PSYCHIATRIC DISORDERS negative neurological ROS  negative psych ROS   GI/Hepatic negative GI ROS, Neg liver ROS,   Endo/Other  negative endocrine ROS  Renal/GU Renal diseasenegative Renal ROS  negative genitourinary   Musculoskeletal   Abdominal   Peds  Hematology negative hematology ROS (+) anemia ,   Anesthesia Other Findings Past Medical History: No date: Anxiety Non-obstructive: CAD (coronary artery disease)     Comment: a. 2004 Cath: nonobs dzs. No date: Cancer [facility])     Comment: melanoma skin cancer No date: DDD (degenerative disc disease)     Comment: a. with chronic right sided back pain -               improves after seeing chiropractor. No date: Depression     Comment: a. h/o ECT No date: Diverticulitis No date: HTN (hypertension) No date: Hyperlipidemia, mixed No date: Memory problem     Comment: "states memory issues" No date: Nephrolithiasis No date: Pancreatic cyst     Comment: a. [id number] Endoscopic U/S: nl UGI tract, 1-40mm               pancreatic cysts, no masses/nodules. No date: Premature ventricular contraction     Comment: a. managed with propafenone. No date: Presence of permanent cardiac pacemaker No date: Symptomatic bradycardia     Comment: a. s/p MDT PPM in 08/2005;  b. 02/2010 Gen               change->MDT Adapta DC PPM, ser # [id number] H. Past Surgical History: 1952:  APPENDECTOMY No date: CATARACT EXTRACTION, BILATERAL 1983: CHOLECYSTECTOMY 06/22/2013: EUS N/A     Comment: Procedure: UPPER ENDOSCOPIC ULTRASOUND (EUS)               LINEAR;  Surgeon: Milus Banister, MD;                Location: [facility];  Service: Endoscopy;                Laterality: N/A; No date: EYE SURGERY Bilateral     Comment: Cataract Extraction with IOL No date: PACEMAKER INSERTION Left     Comment: [employer] No date: TUMOR EXCISION     Comment: And nemamgeomas BMI    Body Mass Index:  25.02 kg/m     Reproductive/Obstetrics                             Anesthesia Physical Anesthesia Plan  ASA: III  Anesthesia Plan: General   Post-op Pain Management:    Induction:   Airway Management Planned:   Additional Equipment:   Intra-op Plan:   Post-operative Plan:   Informed Consent: I have reviewed the patients History and Physical, chart, labs and discussed the procedure including the risks, benefits and alternatives for the proposed anesthesia with the patient or authorized representative who has indicated his/her understanding and acceptance.  Dental Advisory Given  Plan Discussed with: CRNA  Anesthesia Plan Comments:         Anesthesia Quick Evaluation

## 2016-07-07 ENCOUNTER — Other Ambulatory Visit: Payer: Self-pay | Admitting: Psychiatry

## 2016-07-08 ENCOUNTER — Encounter
Admission: RE | Admit: 2016-07-08 | Discharge: 2016-07-08 | Disposition: A | Discharge status: Home / Self Care | Payer: PPO | Source: Ambulatory Visit | Attending: Psychiatry | Admitting: Psychiatry

## 2016-07-08 ENCOUNTER — Encounter: Payer: Self-pay | Admitting: Anesthesiology

## 2016-07-08 DIAGNOSIS — Z885 Allergy status to narcotic agent status: Secondary | ICD-10-CM | POA: Diagnosis not present

## 2016-07-08 DIAGNOSIS — Z87442 Personal history of urinary calculi: Secondary | ICD-10-CM | POA: Diagnosis not present

## 2016-07-08 DIAGNOSIS — I2510 Atherosclerotic heart disease of native coronary artery without angina pectoris: Secondary | ICD-10-CM | POA: Diagnosis not present

## 2016-07-08 DIAGNOSIS — F338 Other recurrent depressive disorders: Secondary | ICD-10-CM | POA: Diagnosis not present

## 2016-07-08 DIAGNOSIS — Z9841 Cataract extraction status, right eye: Secondary | ICD-10-CM | POA: Insufficient documentation

## 2016-07-08 DIAGNOSIS — F332 Major depressive disorder, recurrent severe without psychotic features: Secondary | ICD-10-CM | POA: Diagnosis not present

## 2016-07-08 DIAGNOSIS — F329 Major depressive disorder, single episode, unspecified: Secondary | ICD-10-CM | POA: Diagnosis not present

## 2016-07-08 DIAGNOSIS — I10 Essential (primary) hypertension: Secondary | ICD-10-CM | POA: Diagnosis not present

## 2016-07-08 DIAGNOSIS — Z9049 Acquired absence of other specified parts of digestive tract: Secondary | ICD-10-CM | POA: Diagnosis not present

## 2016-07-08 DIAGNOSIS — Z85820 Personal history of malignant melanoma of skin: Secondary | ICD-10-CM | POA: Insufficient documentation

## 2016-07-08 DIAGNOSIS — E782 Mixed hyperlipidemia: Secondary | ICD-10-CM | POA: Insufficient documentation

## 2016-07-08 DIAGNOSIS — Z888 Allergy status to other drugs, medicaments and biological substances status: Secondary | ICD-10-CM | POA: Insufficient documentation

## 2016-07-08 DIAGNOSIS — Z9842 Cataract extraction status, left eye: Secondary | ICD-10-CM | POA: Insufficient documentation

## 2016-07-08 DIAGNOSIS — Z823 Family history of stroke: Secondary | ICD-10-CM | POA: Diagnosis not present

## 2016-07-08 DIAGNOSIS — Z950 Presence of cardiac pacemaker: Secondary | ICD-10-CM | POA: Diagnosis not present

## 2016-07-08 DIAGNOSIS — D649 Anemia, unspecified: Secondary | ICD-10-CM | POA: Diagnosis not present

## 2016-07-08 LAB — POCT I-STAT 4, (NA,K, GLUC, HGB,HCT)
Glucose, Bld: 104 mg/dL — ABNORMAL HIGH (ref 65–99)
HEMATOCRIT: 47 % — AB (ref 36.0–46.0)
Hemoglobin: 16 g/dL — ABNORMAL HIGH (ref 12.0–15.0)
Potassium: 3.7 mmol/L (ref 3.5–5.1)
Sodium: 143 mmol/L (ref 135–145)

## 2016-07-08 MED ORDER — SODIUM CHLORIDE 0.9 % IV SOLN
INTRAVENOUS | Status: DC | PRN
  Administered 2016-07-08: 10:00:00 via INTRAVENOUS

## 2016-07-08 MED ORDER — METHOHEXITAL SODIUM 100 MG/10ML IV SOSY
PREFILLED_SYRINGE | INTRAVENOUS | Status: DC | PRN
  Administered 2016-07-08: 80 mg via INTRAVENOUS

## 2016-07-08 MED ORDER — SUCCINYLCHOLINE CHLORIDE 20 MG/ML IJ SOLN
INTRAMUSCULAR | Status: DC | PRN
  Administered 2016-07-08: 90 mg via INTRAVENOUS

## 2016-07-08 NOTE — H&P (Signed)
Barbara Marks is an 82 y.o. female.   Chief Complaint: No new complaint feeling much better. No side effects or memory loss HPI: Recurrent severe depression with intermittent ECT booster  Past Medical History:  Diagnosis Date  . Anxiety   . CAD (coronary artery disease) Non-obstructive   a. 2004 Cath: nonobs dzs.  . Cancer ([place])    melanoma skin cancer  . DDD (degenerative disc disease)    a. with chronic right sided back pain - improves after seeing chiropractor.  . Depression    a. h/o ECT  . Diverticulitis   . HTN (hypertension)   . Hyperlipidemia, mixed   . Memory problem    "states memory issues"  . Nephrolithiasis   . Pancreatic cyst    a. [id number] Endoscopic U/S: nl UGI tract, 1-13mm pancreatic cysts, no masses/nodules.  . Premature ventricular contraction    a. managed with propafenone.  . Presence of permanent cardiac pacemaker   . Symptomatic bradycardia    a. s/p MDT PPM in 08/2005;  b. 02/2010 Gen change->MDT Adapta DC PPM, ser # [id number].    Past Surgical History:  Procedure Laterality Date  . APPENDECTOMY  1952  . CATARACT EXTRACTION, BILATERAL    . CHOLECYSTECTOMY  1983  . EUS N/A 06/22/2013   Procedure: UPPER ENDOSCOPIC ULTRASOUND (EUS) LINEAR;  Surgeon: Milus Banister, MD;  [facility];  Service: Endoscopy;  Laterality: N/A;  . EYE SURGERY Bilateral    Cataract Extraction with IOL  . PACEMAKER INSERTION Left    [employer]  . TUMOR EXCISION     And nemamgeomas    Family History  Problem Relation Age of Onset  . Other Mother     died @ 73 - old age.  . Stroke Father     cva after cea in his 87's, died @ 72.   Social History:  reports that she has never smoked. She has never used smokeless tobacco. She reports that she does not drink alcohol or use drugs.  Allergies:  Allergies  Allergen Reactions  . Codeine     unknown  . Simvastatin Other (See Comments)    "leg cramps"     (Not in a hospital admission)  Results for orders  placed or performed during the hospital encounter of 07/08/16 (from the past 48 hour(s))  I-STAT 4, (NA,K, GLUC, HGB,HCT)     Status: Abnormal   Collection Time: 07/08/16  8:53 AM  Result Value Ref Range   Sodium 143 135 - 145 mmol/L   Potassium 3.7 3.5 - 5.1 mmol/L   Glucose, Bld 104 (H) 65 - 99 mg/dL   HCT 47.0 (H) 36.0 - 46.0 %   Hemoglobin 16.0 (H) 12.0 - 15.0 g/dL   No results found.  Review of Systems  Constitutional: Negative.   HENT: Negative.   Eyes: Negative.   Respiratory: Negative.   Cardiovascular: Negative.   Gastrointestinal: Negative.   Musculoskeletal: Negative.   Skin: Negative.   Neurological: Negative.   Psychiatric/Behavioral: Negative for depression, hallucinations, memory loss, substance abuse and suicidal ideas. The patient is not nervous/anxious and does not have insomnia.     Blood pressure (!) 168/68, pulse (!) 44, temperature 98 F (36.7 C), temperature source Oral, resp. rate 16, height 5\' 6"  (1.676 m), weight 69.4 kg (153 lb), SpO2 100 %. Physical Exam  Nursing note and vitals reviewed. Constitutional: She appears well-developed and well-nourished.  HENT:  Head: Normocephalic and atraumatic.  Eyes: Conjunctivae are normal. Pupils are equal,  round, and reactive to light.  Neck: Normal range of motion.  Cardiovascular: Normal heart sounds.   Respiratory: Effort normal.  GI: Soft.  Musculoskeletal: Normal range of motion.  Neurological: She is alert.  Skin: Skin is warm and dry.  Psychiatric: She has a normal mood and affect. Her behavior is normal. Judgment and thought content normal.     Assessment/Plan Feeling much better. Treatment today and then stop with this course and follow-up as needed  [name], MD 07/08/2016, 10:16 AM

## 2016-07-08 NOTE — Procedures (Signed)
ECT SERVICES Physician's Interval Evaluation & Treatment Note  Patient Identification: Barbara Marks MRN:  [id number] Date of Evaluation:  07/08/2016 TX #: 2  MADRS:   MMSE:   P.E. Findings:  Lungs and heart clear vitals normal no change  Psychiatric Interval Note:  Mood feeling much better.  Subjective:  Patient is a 82 y.o. female seen for evaluation for Electroconvulsive Therapy. Treatment today and then follow up just as needed.  Treatment Summary:   []   Right Unilateral             [x]  Bilateral   % Energy : 1.0 ms 75%   Impedance: 2140 ohms  Seizure Energy Index: 42,738 V squared  Postictal Suppression Index: No finding was made due to computer error  Seizure Concordance Index: 89%  Medications  Pre Shock: Toradol 30 mg Brevital 80 mg succinylcholine 90 mg  Post Shock: None  Seizure Duration: 37 seconds by EMG 60 seconds by EEG   Comments: Follow-up as needed   Lungs:  [x]   Clear to auscultation               []  Other:   Heart:    [x]   Regular rhythm             []  irregular rhythm    [x]   Previous H&P reviewed, patient examined and there are NO CHANGES                 []   Previous H&P reviewed, patient examined and there are changes noted.   [name], MD 10/4/201710:34 AM

## 2016-07-08 NOTE — Anesthesia Preprocedure Evaluation (Signed)
Anesthesia Evaluation  Patient identified by MRN, date of birth, ID band Patient awake    Reviewed: Allergy & Precautions, H&P , NPO status , Patient's Chart, lab work & pertinent test results, reviewed documented beta blocker date and time   Airway Mallampati: II   Neck ROM: full    Dental  (+) Poor Dentition   Pulmonary neg pulmonary ROS,    Pulmonary exam normal        Cardiovascular hypertension, + CAD  negative cardio ROS Normal cardiovascular exam+ dysrhythmias + pacemaker  Rate:Normal     Neuro/Psych PSYCHIATRIC DISORDERS negative neurological ROS  negative psych ROS   GI/Hepatic negative GI ROS, Neg liver ROS,   Endo/Other  negative endocrine ROS  Renal/GU Renal diseasenegative Renal ROS  negative genitourinary   Musculoskeletal   Abdominal   Peds  Hematology negative hematology ROS (+) anemia ,   Anesthesia Other Findings Past Medical History: No date: Anxiety Non-obstructive: CAD (coronary artery disease)     Comment: a. 2004 Cath: nonobs dzs. No date: Cancer [facility])     Comment: melanoma skin cancer No date: DDD (degenerative disc disease)     Comment: a. with chronic right sided back pain -               improves after seeing chiropractor. No date: Depression     Comment: a. h/o ECT No date: Diverticulitis No date: HTN (hypertension) No date: Hyperlipidemia, mixed No date: Memory problem     Comment: "states memory issues" No date: Nephrolithiasis No date: Pancreatic cyst     Comment: a. [id number] Endoscopic U/S: nl UGI tract, 1-19mm               pancreatic cysts, no masses/nodules. No date: Premature ventricular contraction     Comment: a. managed with propafenone. No date: Presence of permanent cardiac pacemaker No date: Symptomatic bradycardia     Comment: a. s/p MDT PPM in 08/2005;  b. 02/2010 Gen               change->MDT Adapta DC PPM, ser # [id number] H. Past Surgical History: 1952:  APPENDECTOMY No date: CATARACT EXTRACTION, BILATERAL 1983: CHOLECYSTECTOMY 06/22/2013: EUS N/A     Comment: Procedure: UPPER ENDOSCOPIC ULTRASOUND (EUS)               LINEAR;  Surgeon: Milus Banister, MD;                [facility];  Service: Endoscopy;                Laterality: N/A; No date: EYE SURGERY Bilateral     Comment: Cataract Extraction with IOL No date: PACEMAKER INSERTION Left     Comment: [employer] No date: TUMOR EXCISION     Comment: And nemamgeomas BMI    Body Mass Index:  25.02 kg/m     Reproductive/Obstetrics                             Anesthesia Physical  Anesthesia Plan  ASA: III  Anesthesia Plan: General   Post-op Pain Management:    Induction:   Airway Management Planned:   Additional Equipment:   Intra-op Plan:   Post-operative Plan:   Informed Consent: I have reviewed the patients History and Physical, chart, labs and discussed the procedure including the risks, benefits and alternatives for the proposed anesthesia with the patient or authorized representative who has indicated his/her understanding and  acceptance.   Dental Advisory Given  Plan Discussed with: CRNA  Anesthesia Plan Comments:         Anesthesia Quick Evaluation

## 2016-07-08 NOTE — Transfer of Care (Signed)
Immediate Anesthesia Transfer of Care Note  Patient: Barbara Marks  Procedure(s) Performed: * No procedures listed *  Patient Location: PACU  Anesthesia Type:General  Level of Consciousness: awake and patient cooperative  Airway & Oxygen Therapy: Patient Spontanous Breathing and Patient connected to face mask oxygen  Post-op Assessment: Report given to RN, Post -op Vital signs reviewed and stable and Patient moving all extremities X 4  Post vital signs: Reviewed and stable  Last Vitals:  Vitals:   07/08/16 0850  BP: (!) 168/68  Pulse: (!) 44  Resp: 16  Temp: 36.7 C    Last Pain:  Vitals:   07/08/16 0850  TempSrc: Oral         Complications: No apparent anesthesia complications

## 2016-07-08 NOTE — Discharge Instructions (Signed)
1)  The drugs that you have been given will stay in your system until tomorrow so for the       next 24 hours you should not:  A. Drive an automobile  B. Make any legal decisions  C. Drink any alcoholic beverages  2)  You may resume your regular meals upon return home.  3)  A responsible adult must take you home.  Someone should stay with you for a few          hours, then be available by phone for the remainder of the treatment day.  4)  You May experience any of the following symptoms:  Headache, Nausea and a dry mouth (due to the medications you were given),  temporary memory loss and some confusion, or sore muscles (a warm bath  should help this).  If you you experience any of these symptoms let us know on                your return visit.  5)  Report any of the following: any acute discomfort, severe headache, or temperature        greater than 100.5 F.   Also report any unusual redness, swelling, drainage, or pain         at your IV site.    You may report Symptoms to:  [place] at [facility]          Phone: [phone], ECT Department           or Dr. Prescott Gum office [phone]  6)  Your next ECT Treatment is for you to call as you feel the need for a treatment.  We will call 2 days prior to your scheduled appointment for arrival times.  7)  Nothing to eat or drink after midnight the night before your procedure.  8)  Take .    With a sip of water the morning of your procedure.  9)  Other Instructions: Call [phone] to cancel the morning of your procedure due         to illness or emergency.  10) We will call within 72 hours to assess how you are feeling.

## 2016-07-09 NOTE — Anesthesia Postprocedure Evaluation (Signed)
Anesthesia Post Note  Patient: Barbara Marks  Procedure(s) Performed: * No procedures listed *  Patient location during evaluation: PACU Anesthesia Type: General Level of consciousness: awake and alert Pain management: pain level controlled Vital Signs Assessment: post-procedure vital signs reviewed and stable Respiratory status: spontaneous breathing, nonlabored ventilation, respiratory function stable and patient connected to nasal cannula oxygen Cardiovascular status: blood pressure returned to baseline and stable Postop Assessment: no signs of nausea or vomiting Anesthetic complications: no    Last Vitals:  Vitals:   07/08/16 1105 07/08/16 1110  BP: (!) 174/73 (!) 167/99  Pulse:  86  Resp:  16  Temp: 36.4 C     Last Pain:  Vitals:   07/08/16 1039  TempSrc:   PainSc: Asleep                 Martha Clan

## 2016-07-15 ENCOUNTER — Other Ambulatory Visit: Payer: Self-pay | Admitting: Psychiatry

## 2016-07-16 ENCOUNTER — Other Ambulatory Visit: Payer: Self-pay | Admitting: Psychiatry

## 2016-09-17 DIAGNOSIS — E785 Hyperlipidemia, unspecified: Secondary | ICD-10-CM | POA: Diagnosis not present

## 2016-09-17 DIAGNOSIS — Z0000 Encounter for general adult medical examination without abnormal findings: Secondary | ICD-10-CM | POA: Diagnosis not present

## 2016-09-17 DIAGNOSIS — F329 Major depressive disorder, single episode, unspecified: Secondary | ICD-10-CM | POA: Diagnosis not present

## 2016-09-17 DIAGNOSIS — I10 Essential (primary) hypertension: Secondary | ICD-10-CM | POA: Diagnosis not present

## 2016-09-22 DIAGNOSIS — E785 Hyperlipidemia, unspecified: Secondary | ICD-10-CM | POA: Diagnosis not present

## 2016-09-22 DIAGNOSIS — I10 Essential (primary) hypertension: Secondary | ICD-10-CM | POA: Diagnosis not present

## 2016-10-28 ENCOUNTER — Encounter: Payer: Self-pay | Admitting: Cardiovascular Disease

## 2016-10-28 ENCOUNTER — Ambulatory Visit (INDEPENDENT_AMBULATORY_CARE_PROVIDER_SITE_OTHER): Payer: PPO | Admitting: Cardiovascular Disease

## 2016-10-28 VITALS — BP 124/64 | HR 82 | Ht 66.0 in | Wt 157.8 lb

## 2016-10-28 DIAGNOSIS — R0789 Other chest pain: Secondary | ICD-10-CM

## 2016-10-28 DIAGNOSIS — I447 Left bundle-branch block, unspecified: Secondary | ICD-10-CM

## 2016-10-28 DIAGNOSIS — E782 Mixed hyperlipidemia: Secondary | ICD-10-CM

## 2016-10-28 DIAGNOSIS — I493 Ventricular premature depolarization: Secondary | ICD-10-CM | POA: Diagnosis not present

## 2016-10-28 DIAGNOSIS — F332 Major depressive disorder, recurrent severe without psychotic features: Secondary | ICD-10-CM

## 2016-10-28 DIAGNOSIS — I10 Essential (primary) hypertension: Secondary | ICD-10-CM | POA: Diagnosis not present

## 2016-10-28 NOTE — Progress Notes (Signed)
Cardiology Office Note  Date:  10/28/2016   ID:  FLORRIE FRITSCHE, DOB June 07, 1934, MRN [id number]  PCP:  Juluis Pitch, MD   Chief Complaint  Patient presents with  . other    Last seen 07-Jan-2015 c/o fatigue and sleepless. Meds reviewed verbally with pt.    HPI:  Ms. Pranke is a 83 year old woman,   cardiac cath in 01-07-03 with [place]obstructive [place], pacemaker placed for "bradycardia" And PVCs, profound significant long history of depression and anxiety requiring ECT treatment dating back to the 80s, history of kidney stones with lithotripsy, who presents for routine followup of her PVCs. Husband passed away 01/06/2010  Review of previous EKGs as below ekg 10/15: bigeminal pattern ekg 01-07-15: bigeminal pattern ekg 08/08/2015: PVCs in bigeminal pattern ekg 11/19/15, PVC x 3 EKG 07/2016: PVC bigeminal pattern  Previously tried on Rythmol for PVCs, she stopped this on her own as she did not feel it was helping. She previously requested to have her pacemaker removed, this was felt to be a high risk procedure, and this was not performed  Review of previous studies shows Echo 09/2013: normal EF  Otherwise she reports that she is doing very well. Rare ECT treatments.  under the care of psychiatrist, dr. Weber Cooks Takes antidepression meds every other day, side effects scared her Takes amlodipine 5 mg QOD, read side effects Continues to have frequent PVCs but is asymptomatic  previous lab work showing Total cholesterol 245, LDL 150 She takes 2 red yeast rice daily She does not want any cholesterol medication, had myalgias on statins  Down 11 pounds in the past 2 years, eating healthy Poor sleep at night, unable to nap in the day Takes melatonin No recent chest pain symptoms  EKG on today's visit shows Normal sinus rhythm with rate 82 beats per minute with PVCs in a bigeminal pattern  Other past medical history Previous admission to [facility] for chest pain. She had normal  echocardiogram and stress test  previously referred to Dr. Caryl Comes for her symptomatic PVCs. started on Rythmol b.i.d.,  improvement of her symptoms. She continued to have PVCs and was asymptomatic. She stopped the Rythmol on her own She did not have any side effects on the Rythmol   Long history of  ECT treatments every 4-5 weeks.  She was followed by Dr. Thurmond Butts.  She reports that she is ECT treatment plan tomorrow She reports having some memory loss and wonders if this could be from ECT treatment     PMH:   has a past medical history of Anxiety; CAD (coronary artery disease) (Non-obstructive); Cancer [facility]); DDD (degenerative disc disease); Depression; Diverticulitis; HTN (hypertension); Hyperlipidemia, mixed; Memory problem; Nephrolithiasis; Pancreatic cyst; Premature ventricular contraction; Presence of permanent cardiac pacemaker; and Symptomatic bradycardia.  PSH:    Past Surgical History:  Procedure Laterality Date  . APPENDECTOMY  1952  . CATARACT EXTRACTION, BILATERAL    . CHOLECYSTECTOMY  1983  . EUS N/A 06/22/2013   Procedure: UPPER ENDOSCOPIC ULTRASOUND (EUS) LINEAR;  Surgeon: Milus Banister, MD;  Location: [facility];  Service: Endoscopy;  Laterality: N/A;  . EYE SURGERY Bilateral    Cataract Extraction with IOL  . PACEMAKER INSERTION Left    Medtronic  . TUMOR EXCISION     And nemamgeomas    Current Outpatient Prescriptions  Medication Sig Dispense Refill  . amLODipine (NORVASC) 5 MG tablet Take 5 mg by mouth every morning.     [name] buPROPion (WELLBUTRIN XL) 300  MG 24 hr tablet Take 1 tablet (300 mg total) by mouth every other day. 30 tablet 3  . cholecalciferol (VITAMIN D) 1000 UNITS tablet Take 2,000 Units by mouth daily.      . Coenzyme Q10 (COQ10) 150 MG CAPS Take 1 capsule by mouth daily.      . Cyanocobalamin (VITAMIN B 12 PO) Take 2 tablets by mouth daily.     . DULoxetine (CYMBALTA) 60 MG capsule TAKE ONE CAPSULE BY MOUTH EVERY DAY (Patient taking differently:  TAKE ONE CAPSULE BY MOUTH EVERY OTHER DAY) 30 capsule 3  . Glucosamine-Chondroit-Vit C-Mn (GLUCOSAMINE-CHONDROITIN) CAPS Take 1 capsule by mouth daily.     [name] ketoconazole (NIZORAL) 2 % shampoo APPLY TO AFFECTED AREA EVERY DAY  6  . LORazepam (ATIVAN) 0.5 MG tablet Take 1 tablet (0.5 mg total) by mouth every 8 (eight) hours as needed for anxiety. 30 tablet 0  . Melatonin 1 MG TABS Take 1 mg by mouth at bedtime.     . Multiple Vitamin (MULTIVITAMIN) tablet Take 1 tablet by mouth daily.      . Multiple Vitamins-Minerals (PRESERVISION AREDS) TABS Take 1 tablet by mouth daily.     . potassium chloride (K-DUR) 10 MEQ tablet Take 10 mEq by mouth daily.    . Probiotic Product (PROBIOTIC DAILY PO) Take by mouth daily.    . Red Yeast Rice 600 MG CAPS Take 600 mg by mouth daily.    . Vitamin B Complex-C CAPS Take 1 capsule by mouth daily.       No current facility-administered medications for this visit.      Allergies:   Codeine and Simvastatin   Social History:  The patient  reports that she has never smoked. She has never used smokeless tobacco. She reports that she does not drink alcohol or use drugs.   Family History:   family history includes Other in her mother; Stroke in her father.    Review of Systems: Review of Systems  Constitutional: Negative.   Respiratory: Negative.   Cardiovascular: Negative.   Gastrointestinal: Negative.   Musculoskeletal: Negative.   Neurological: Negative.   Psychiatric/Behavioral: Negative.   All other systems reviewed and are negative.    PHYSICAL EXAM: VS:  BP 124/64 (BP Location: Left Arm, Patient Position: Sitting, Cuff Size: Normal)   Pulse 82   Ht 5\' 6"  (1.676 m)   Wt 157 lb 12 oz (71.6 kg)   BMI 25.46 kg/m  , BMI Body mass index is 25.46 kg/m. GEN: Well nourished, well developed, in no acute distress  HEENT: normal  Neck: no JVD, carotid bruits, or masses Cardiac: Regularly irregular rhythm,; no murmurs, rubs, or gallops,no edema   Respiratory:  clear to auscultation bilaterally, normal work of breathing GI: soft, nontender, nondistended, + BS MS: no deformity or atrophy  Skin: warm and dry, no rash Neuro:  Strength and sensation are intact Psych: euthymic mood, full affect    Recent Labs: 11/19/2015: Platelets 190 07/06/2016: ALT 16; BUN 12; Creatinine, Ser 0.72 07/08/2016: Hemoglobin 16.0; Potassium 3.7; Sodium 143    Lipid Panel No results found for: CHOL, HDL, LDLCALC, TRIG    Wt Readings from Last 3 Encounters:  10/28/16 157 lb 12 oz (71.6 kg)  11/19/15 161 lb (73 kg)  08/08/15 168 lb 8 oz (76.4 kg)       ASSESSMENT AND PLAN:  Frequent PVCs - Plan: EKG 12-Lead Long discussion concerning her PVCs. Prior studies several years ago showing normal ejection fraction. She  does not want repeat echocardiogram to confirm cardiac function. She is not bothered by her PVCs, asymptomatic in general, does not want medication to treat her PVCs. Disappointed that she cannot have her pacemaker removed  Left bundle branch block - Plan: EKG 12-Lead Long history of bundle branch block, nonischemic etiology  Mixed hyperlipidemia Not interested in a statin. Prefers to take red yeast rice 2 per day  Essential hypertension Blood pressure is well controlled on today's visit. No changes made to the medications.  Severe recurrent major depression without psychotic features ([place]) She is followed by psychiatry Has periodic ECT treatments  CHEST PAIN, NON-CARDIAC Remote history of chest pain, denies having any symptoms recently   Total encounter time more than 25 minutes  Greater than 50% was spent in counseling and coordination of care with the patient   Disposition:   F/U  12 months as needed   Orders Placed This Encounter  Procedures  . EKG 12-Lead     Signed, Esmond Plants, M.D., Ph.D. 10/28/2016  [place], [place]

## 2016-10-28 NOTE — Patient Instructions (Addendum)

## 2016-11-24 ENCOUNTER — Telehealth: Payer: Self-pay | Admitting: Psychiatry

## 2016-11-24 NOTE — Telephone Encounter (Signed)
I returned her call this evening. We would not be able to put her on the schedule for Wednesday but we will put her on for Friday. She agrees to the plan.

## 2016-11-27 ENCOUNTER — Encounter: Payer: Self-pay | Admitting: Anesthesiology

## 2016-11-27 ENCOUNTER — Other Ambulatory Visit: Payer: Self-pay | Admitting: Psychiatry

## 2016-11-27 ENCOUNTER — Encounter
Admission: RE | Admit: 2016-11-27 | Discharge: 2016-11-27 | Disposition: A | Discharge status: Home / Self Care | Payer: PPO | Source: Ambulatory Visit | Attending: Psychiatry | Admitting: Psychiatry

## 2016-11-27 DIAGNOSIS — F329 Major depressive disorder, single episode, unspecified: Secondary | ICD-10-CM | POA: Insufficient documentation

## 2016-11-27 DIAGNOSIS — F332 Major depressive disorder, recurrent severe without psychotic features: Secondary | ICD-10-CM

## 2016-11-27 DIAGNOSIS — I10 Essential (primary) hypertension: Secondary | ICD-10-CM | POA: Diagnosis not present

## 2016-11-27 MED ORDER — SODIUM CHLORIDE 0.9 % IV SOLN
500.0000 mL | Freq: Once | INTRAVENOUS | Status: AC
  Administered 2016-11-27: 10:00:00 via INTRAVENOUS

## 2016-11-27 MED ORDER — SUCCINYLCHOLINE CHLORIDE 20 MG/ML IJ SOLN
INTRAMUSCULAR | Status: AC
  Filled 2016-11-27: qty 1

## 2016-11-27 MED ORDER — SUCCINYLCHOLINE CHLORIDE 200 MG/10ML IV SOSY
PREFILLED_SYRINGE | INTRAVENOUS | Status: DC | PRN
  Administered 2016-11-27: 90 mg via INTRAVENOUS

## 2016-11-27 MED ORDER — METHOHEXITAL SODIUM 100 MG/10ML IV SOSY
PREFILLED_SYRINGE | INTRAVENOUS | Status: DC | PRN
  Administered 2016-11-27: 80 mg via INTRAVENOUS

## 2016-11-27 MED ORDER — KETOROLAC TROMETHAMINE 30 MG/ML IJ SOLN
INTRAMUSCULAR | Status: AC
  Filled 2016-11-27: qty 1

## 2016-11-27 MED ORDER — KETOROLAC TROMETHAMINE 30 MG/ML IJ SOLN
30.0000 mg | Freq: Once | INTRAMUSCULAR | Status: AC
  Administered 2016-11-27: 30 mg via INTRAVENOUS

## 2016-11-27 MED ORDER — SODIUM CHLORIDE 0.9 % IV SOLN
INTRAVENOUS | Status: DC | PRN
  Administered 2016-11-27: 12:00:00 via INTRAVENOUS

## 2016-11-27 NOTE — Discharge Instructions (Signed)
1)  The drugs that you have been given will stay in your system until tomorrow so for the       next 24 hours you should not:  A. Drive an automobile  B. Make any legal decisions  C. Drink any alcoholic beverages  2)  You may resume your regular meals upon return home.  3)  A responsible adult must take you home.  Someone should stay with you for a few          hours, then be available by phone for the remainder of the treatment day.  4)  You May experience any of the following symptoms:  Headache, Nausea and a dry mouth (due to the medications you were given),  temporary memory loss and some confusion, or sore muscles (a warm bath  should help this).  If you you experience any of these symptoms let us know on                your return visit.  5)  Report any of the following: any acute discomfort, severe headache, or temperature        greater than 100.5 F.   Also report any unusual redness, swelling, drainage, or pain         at your IV site.    You may report Symptoms to:  [facility]          Phone: [phone], ECT Department           or Dr. Prescott Gum office [phone]  6)  Your next ECT Treatment is Day . Date .  Call for appointment when needed. f   We will call 2 days prior to your scheduled appointment for arrival times.  7)  Nothing to eat or drink after midnight the night before your procedure.  8)  Take amlodipine  With a sip of water the morning of your procedure.  9)  Other Instructions: Call [phone] to cancel the morning of your procedure due         to illness or emergency.  10) We will call within 72 hours to assess how you are feeling.

## 2016-11-27 NOTE — Procedures (Signed)
ECT SERVICES Physician's Interval Evaluation & Treatment Note  Patient Identification: Barbara Marks MRN:  [id number] Date of Evaluation:  11/27/2016 TX #: 3  MADRS:   MMSE:   P.E. Findings:  normal  Psychiatric Interval Note:  depresed not suicidal or psychotic  Subjective:  Patient is a 83 y.o. female seen for evaluation for Electroconvulsive Therapy. depressed  Treatment Summary:   []   Right Unilateral             [x]  Bilateral   % Energy : 1.24ms 75%   Impedance: 1560 ohms  Seizure Energy Index: 3762 microvolts squared  Postictal Suppression Index: 28%  Seizure Concordance Index: 92%  Medications  Pre Shock: tor 30mg , brev 80mg , suc 90mg   Post Shock:   Seizure Duration: 31sec emg, 68sec eeg   Comments: Follow up prn  Lungs:  [x]   Clear to auscultation               []  Other:   Heart:    [x]   Regular rhythm             []  irregular rhythm    [x]   Previous H&P reviewed, patient examined and there are NO CHANGES                 []   Previous H&P reviewed, patient examined and there are changes noted.   [name], MD 2/23/201812:18 PM

## 2016-11-27 NOTE — Anesthesia Postprocedure Evaluation (Signed)
Anesthesia Post Note  Patient: Barbara Marks  Procedure(s) Performed: * No procedures listed *  Patient location during evaluation: PACU Anesthesia Type: General Level of consciousness: awake and alert Pain management: pain level controlled Vital Signs Assessment: post-procedure vital signs reviewed and stable Respiratory status: spontaneous breathing, nonlabored ventilation, respiratory function stable and patient connected to nasal cannula oxygen Cardiovascular status: blood pressure returned to baseline and stable Postop Assessment: no signs of nausea or vomiting Anesthetic complications: no     Last Vitals:  Vitals:   11/27/16 1306 11/27/16 1315  BP: (!) 169/80 (!) 149/80  Pulse: (!) 42 (!) 45  Resp: 18 18  Temp: 36.7 C     Last Pain:  Vitals:   11/27/16 0952  TempSrc: Oral                 Precious Haws Piscitello

## 2016-11-27 NOTE — Transfer of Care (Signed)
Immediate Anesthesia Transfer of Care Note  Patient: Barbara Marks  Procedure(s) Performed: ECT  Patient Location: PACU  Anesthesia Type:General  Level of Consciousness: sedated  Airway & Oxygen Therapy: Patient Spontanous Breathing and Patient connected to face mask oxygen  Post-op Assessment: Report given to RN and Post -op Vital signs reviewed and stable  Post vital signs: Reviewed and stable  Last Vitals:  Vitals:   11/27/16 0952 11/27/16 1238  BP: (!) 161/62 (!) 174/75  Pulse: (!) 42 (!) 40  Resp: 16 12  Temp: 36.6 C 37.2 C    Last Pain:  Vitals:   11/27/16 0952  TempSrc: Oral         Complications: No apparent anesthesia complications

## 2016-11-27 NOTE — Anesthesia Procedure Notes (Signed)
Date/Time: 11/27/2016 12:26 PM Performed by: Dionne Bucy Pre-anesthesia Checklist: Patient identified, Emergency Drugs available, Suction available and Patient being monitored Patient Re-evaluated:Patient Re-evaluated prior to inductionOxygen Delivery Method: Circle system utilized Preoxygenation: Pre-oxygenation with 100% oxygen Intubation Type: IV induction Ventilation: Mask ventilation without difficulty and Mask ventilation throughout procedure Airway Equipment and Method: Bite block Placement Confirmation: positive ETCO2 Dental Injury: Teeth and Oropharynx as per pre-operative assessment

## 2016-11-27 NOTE — Anesthesia Post-op Follow-up Note (Cosign Needed)
Anesthesia QCDR form completed.        

## 2016-11-27 NOTE — H&P (Signed)
Barbara Marks is an 83 y.o. female.   Chief Complaint: return of severe derpession HPI: recurrent depression  Past Medical History:  Diagnosis Date  . Anxiety   . CAD (coronary artery disease) Non-obstructive   a. 2004 Cath: nonobs dzs.  . Cancer ([place])    melanoma skin cancer  . DDD (degenerative disc disease)    a. with chronic right sided back pain - improves after seeing chiropractor.  . Depression    a. h/o ECT  . Diverticulitis   . HTN (hypertension)   . Hyperlipidemia, mixed   . Memory problem    "states memory issues"  . Nephrolithiasis   . Pancreatic cyst    a. [id number] Endoscopic U/S: nl UGI tract, 1-34mm pancreatic cysts, no masses/nodules.  . Premature ventricular contraction    a. managed with propafenone.  . Presence of permanent cardiac pacemaker   . Symptomatic bradycardia    a. s/p MDT PPM in 08/2005;  b. 02/2010 Gen change->MDT Adapta DC PPM, ser # [id number] H.    Past Surgical History:  Procedure Laterality Date  . APPENDECTOMY  1952  . CATARACT EXTRACTION, BILATERAL    . CHOLECYSTECTOMY  1983  . EUS N/A 06/22/2013   Procedure: UPPER ENDOSCOPIC ULTRASOUND (EUS) LINEAR;  Surgeon: Milus Banister, MD;  Location: [facility];  Service: Endoscopy;  Laterality: N/A;  . EYE SURGERY Bilateral    Cataract Extraction with IOL  . PACEMAKER INSERTION Left    Medtronic  . TUMOR EXCISION     And nemamgeomas    Family History  Problem Relation Age of Onset  . Other Mother     died @ 31 - old age.  . Stroke Father     cva after cea in his 56's, died @ 30.   Social History:  reports that she has never smoked. She has never used smokeless tobacco. She reports that she does not drink alcohol or use drugs.  Allergies:  Allergies  Allergen Reactions  . Codeine     unknown  . Simvastatin Other (See Comments)    "leg cramps"     (Not in a hospital admission)  No results found for this or any previous visit (from the past 48 hour(s)). No results  found.  Review of Systems  Constitutional: Negative.   HENT: Negative.   Eyes: Negative.   Respiratory: Negative.   Cardiovascular: Negative.   Gastrointestinal: Negative.   Musculoskeletal: Negative.   Skin: Negative.   Neurological: Negative.   Psychiatric/Behavioral: Positive for depression. Negative for hallucinations, memory loss, substance abuse and suicidal ideas. The patient has insomnia. The patient is not nervous/anxious.     Blood pressure (!) 161/62, pulse (!) 42, temperature 97.9 F (36.6 C), temperature source Oral, resp. rate 16, height 5\' 6"  (1.676 m), weight 70.3 kg (155 lb), SpO2 98 %. Physical Exam  Nursing note and vitals reviewed. Constitutional: She appears well-developed and well-nourished.  HENT:  Head: Normocephalic and atraumatic.  Eyes: Conjunctivae are normal. Pupils are equal, round, and reactive to light.  Neck: Normal range of motion.  Cardiovascular: Regular rhythm and normal heart sounds.   Respiratory: Effort normal. No respiratory distress.  GI: Soft.  Musculoskeletal: Normal range of motion.  Neurological: She is alert.  Skin: Skin is warm and dry.  Psychiatric: Judgment normal. Her affect is blunt. Her speech is delayed. She is slowed and withdrawn. Cognition and memory are normal. She exhibits a depressed mood. She expresses no homicidal and no suicidal ideation.  Assessment/Plan ECT today and she will call back next week if more treatment is needed  [name], MD 11/27/2016, 12:16 PM

## 2016-11-27 NOTE — Anesthesia Preprocedure Evaluation (Signed)
Anesthesia Evaluation  Patient identified by MRN, date of birth, ID band Patient awake    Reviewed: Allergy & Precautions, H&P , NPO status , Patient's Chart, lab work & pertinent test results, reviewed documented beta blocker date and time   Airway Mallampati: II  TM Distance: >3 FB Neck ROM: limited    Dental  (+) Poor Dentition, Chipped, Missing, Partial Lower, Partial Upper   Pulmonary neg pulmonary ROS,    Pulmonary exam normal        Cardiovascular hypertension, + CAD  negative cardio ROS Normal cardiovascular exam+ dysrhythmias + pacemaker  Rate:Normal     Neuro/Psych PSYCHIATRIC DISORDERS Anxiety Depression negative neurological ROS  negative psych ROS   GI/Hepatic negative GI ROS, Neg liver ROS,   Endo/Other  negative endocrine ROS  Renal/GU Renal diseasenegative Renal ROS  negative genitourinary   Musculoskeletal  (+) Arthritis ,   Abdominal   Peds  Hematology negative hematology ROS (+) anemia ,   Anesthesia Other Findings Past Medical History: No date: Anxiety Non-obstructive: CAD (coronary artery disease)     Comment: a. 2004 Cath: nonobs dzs. No date: Cancer [facility])     Comment: melanoma skin cancer No date: DDD (degenerative disc disease)     Comment: a. with chronic right sided back pain -               improves after seeing chiropractor. No date: Depression     Comment: a. h/o ECT No date: Diverticulitis No date: HTN (hypertension) No date: Hyperlipidemia, mixed No date: Memory problem     Comment: "states memory issues" No date: Nephrolithiasis No date: Pancreatic cyst     Comment: a. [id number] Endoscopic U/S: nl UGI tract, 1-56mm               pancreatic cysts, no masses/nodules. No date: Premature ventricular contraction     Comment: a. managed with propafenone. No date: Presence of permanent cardiac pacemaker No date: Symptomatic bradycardia     Comment: a. s/p MDT PPM in 08/2005;  b.  02/2010 Gen               change->MDT Adapta DC PPM, ser # [id number] H. Past Surgical History: 1952: APPENDECTOMY No date: CATARACT EXTRACTION, BILATERAL 1983: CHOLECYSTECTOMY 06/22/2013: EUS N/A     Comment: Procedure: UPPER ENDOSCOPIC ULTRASOUND (EUS)               LINEAR;  Surgeon: Milus Banister, MD;                Location: [facility];  Service: Endoscopy;                Laterality: N/A; No date: EYE SURGERY Bilateral     Comment: Cataract Extraction with IOL No date: PACEMAKER INSERTION Left     Comment: Medtronic No date: TUMOR EXCISION     Comment: And nemamgeomas BMI    Body Mass Index:  25.02 kg/m     Reproductive/Obstetrics                             Anesthesia Physical  Anesthesia Plan  ASA: III  Anesthesia Plan: General   Post-op Pain Management:    Induction:   Airway Management Planned:   Additional Equipment:   Intra-op Plan:   Post-operative Plan:   Informed Consent: I have reviewed the patients History and Physical, chart, labs and discussed the procedure including the risks, benefits and alternatives for  the proposed anesthesia with the patient or authorized representative who has indicated his/her understanding and acceptance.   Dental Advisory Given  Plan Discussed with: CRNA  Anesthesia Plan Comments:         Anesthesia Quick Evaluation

## 2016-12-25 ENCOUNTER — Other Ambulatory Visit: Payer: Self-pay | Admitting: Psychiatry

## 2016-12-30 ENCOUNTER — Other Ambulatory Visit: Payer: Self-pay | Admitting: Psychiatry

## 2017-01-07 ENCOUNTER — Other Ambulatory Visit: Payer: Self-pay | Admitting: Psychiatry

## 2017-01-07 ENCOUNTER — Telehealth: Payer: Self-pay

## 2017-01-07 MED ORDER — BUPROPION HCL ER (XL) 300 MG PO TB24: 300.0000 mg | ORAL_TABLET | ORAL | 3 refills | Status: DC

## 2017-01-07 MED ORDER — DULOXETINE HCL 60 MG PO CPEP: 60.0000 mg | ORAL_CAPSULE | Freq: Every day | ORAL | 3 refills | Status: DC

## 2017-01-07 NOTE — Telephone Encounter (Signed)
I refilled these prescriptions anyway.

## 2017-01-07 NOTE — Telephone Encounter (Signed)
pt called states that [facility] states she doesn't have any more refills. pt was told that she needed an appt to be seen last seen in the office was 11-18-15.  so patient was transfered to front desk to make an appt to be seen.

## 2017-01-12 ENCOUNTER — Ambulatory Visit: Payer: PPO | Admitting: Psychiatry

## 2017-01-12 NOTE — Addendum Note (Signed)
Addendum  created 01/12/17 0743 by Andria Frames, MD   Anesthesia Attestations filed

## 2017-02-08 DIAGNOSIS — H43813 Vitreous degeneration, bilateral: Secondary | ICD-10-CM | POA: Diagnosis not present

## 2017-09-20 DIAGNOSIS — I10 Essential (primary) hypertension: Secondary | ICD-10-CM | POA: Diagnosis not present

## 2017-09-20 DIAGNOSIS — E785 Hyperlipidemia, unspecified: Secondary | ICD-10-CM | POA: Diagnosis not present

## 2017-09-20 DIAGNOSIS — Z0000 Encounter for general adult medical examination without abnormal findings: Secondary | ICD-10-CM | POA: Diagnosis not present

## 2017-09-20 DIAGNOSIS — F329 Major depressive disorder, single episode, unspecified: Secondary | ICD-10-CM | POA: Diagnosis not present

## 2017-10-05 ENCOUNTER — Other Ambulatory Visit: Payer: Self-pay | Admitting: Psychiatry

## 2017-10-06 ENCOUNTER — Encounter: Payer: Self-pay | Admitting: Anesthesiology

## 2017-10-06 ENCOUNTER — Encounter
Admission: RE | Admit: 2017-10-06 | Discharge: 2017-10-06 | Disposition: A | Discharge status: Home / Self Care | Payer: PPO | Source: Ambulatory Visit | Attending: Psychiatry | Admitting: Psychiatry

## 2017-10-06 DIAGNOSIS — Z90710 Acquired absence of both cervix and uterus: Secondary | ICD-10-CM | POA: Insufficient documentation

## 2017-10-06 DIAGNOSIS — F329 Major depressive disorder, single episode, unspecified: Secondary | ICD-10-CM | POA: Insufficient documentation

## 2017-10-06 DIAGNOSIS — F332 Major depressive disorder, recurrent severe without psychotic features: Secondary | ICD-10-CM

## 2017-10-06 DIAGNOSIS — F419 Anxiety disorder, unspecified: Secondary | ICD-10-CM | POA: Diagnosis not present

## 2017-10-06 DIAGNOSIS — Z950 Presence of cardiac pacemaker: Secondary | ICD-10-CM | POA: Diagnosis not present

## 2017-10-06 DIAGNOSIS — Z9049 Acquired absence of other specified parts of digestive tract: Secondary | ICD-10-CM | POA: Insufficient documentation

## 2017-10-06 DIAGNOSIS — I10 Essential (primary) hypertension: Secondary | ICD-10-CM | POA: Diagnosis not present

## 2017-10-06 DIAGNOSIS — F339 Major depressive disorder, recurrent, unspecified: Secondary | ICD-10-CM | POA: Diagnosis not present

## 2017-10-06 MED ORDER — FENTANYL CITRATE (PF) 100 MCG/2ML IJ SOLN: 25.0000 ug | INTRAMUSCULAR | Status: DC | PRN

## 2017-10-06 MED ORDER — KETOROLAC TROMETHAMINE 30 MG/ML IJ SOLN
INTRAMUSCULAR | Status: AC
  Filled 2017-10-06: qty 1

## 2017-10-06 MED ORDER — METHOHEXITAL SODIUM 100 MG/10ML IV SOSY
PREFILLED_SYRINGE | INTRAVENOUS | Status: DC | PRN
  Administered 2017-10-06: 80 mg via INTRAVENOUS

## 2017-10-06 MED ORDER — ONDANSETRON HCL 4 MG/2ML IJ SOLN: 4.0000 mg | Freq: Once | INTRAMUSCULAR | Status: DC | PRN

## 2017-10-06 MED ORDER — SODIUM CHLORIDE 0.9 % IV SOLN
500.0000 mL | Freq: Once | INTRAVENOUS | Status: AC
  Administered 2017-10-06: 500 mL via INTRAVENOUS

## 2017-10-06 MED ORDER — SUCCINYLCHOLINE CHLORIDE 20 MG/ML IJ SOLN
INTRAMUSCULAR | Status: AC
  Filled 2017-10-06: qty 1

## 2017-10-06 MED ORDER — SODIUM CHLORIDE 0.9 % IV SOLN
INTRAVENOUS | Status: DC | PRN
  Administered 2017-10-06: 10:00:00 via INTRAVENOUS

## 2017-10-06 MED ORDER — KETOROLAC TROMETHAMINE 30 MG/ML IJ SOLN
30.0000 mg | Freq: Once | INTRAMUSCULAR | Status: AC
  Administered 2017-10-06: 30 mg via INTRAVENOUS

## 2017-10-06 MED ORDER — SUCCINYLCHOLINE CHLORIDE 200 MG/10ML IV SOSY
PREFILLED_SYRINGE | INTRAVENOUS | Status: DC | PRN
  Administered 2017-10-06: 90 mg via INTRAVENOUS

## 2017-10-06 NOTE — Anesthesia Post-op Follow-up Note (Signed)
Anesthesia QCDR form completed.        

## 2017-10-06 NOTE — Anesthesia Postprocedure Evaluation (Signed)
Anesthesia Post Note  Patient: Barbara Marks  Procedure(s) Performed: ECT TX  Patient location during evaluation: PACU Anesthesia Type: General Level of consciousness: awake and alert Pain management: pain level controlled Vital Signs Assessment: post-procedure vital signs reviewed and stable Respiratory status: spontaneous breathing, nonlabored ventilation, respiratory function stable and patient connected to nasal cannula oxygen Cardiovascular status: blood pressure returned to baseline and stable Postop Assessment: no apparent nausea or vomiting Anesthetic complications: no     Last Vitals:  Vitals:   10/06/17 1129 10/06/17 1138  BP: (!) 147/80   Pulse: 83 85  Resp: (!) 21 18  Temp: 36.7 C 36.7 C  SpO2: 100%     Last Pain:  Vitals:   10/06/17 1138  TempSrc: Oral  PainSc:                  [name] S

## 2017-10-06 NOTE — Procedures (Signed)
ECT SERVICES Physician's Interval Evaluation & Treatment Note  Patient Identification: Barbara Marks MRN:  [id number] Date of Evaluation:  10/06/2017 TX #: 1  MADRS: 32  MMSE: 30  P.E. Findings:  No change to physical exam heart and lungs normal vitals unremarkable.  Elevated blood pressure but that is very typical when she comes in for ECT  Psychiatric Interval Note:  Feeling much more nervous down not eating well and sleeping well.  No suicidal thought no psychosis  Subjective:  Patient is a 84 y.o. female seen for evaluation for Electroconvulsive Therapy. Subjectively aware of the depressive symptoms are returning for about 2 weeks  Treatment Summary:   []   Right Unilateral             [x]  Bilateral   % Energy : 1.0 ms 75%   Impedance: 1230 ohms  Seizure Energy Index: 6237 V squared  Postictal Suppression Index: 88%  Seizure Concordance Index: 89%  Medications  Pre Shock: Toradol 30 mg Brevital 80 mg succinylcholine 90mg   Post Shock:    Seizure Duration: 55 seconds by EMG 79 seconds by EEG   Comments: Follow-up as needed she will let us know  Lungs:  [x]   Clear to auscultation               []  Other:   Heart:    [x]   Regular rhythm             []  irregular rhythm    [x]   Previous H&P reviewed, patient examined and there are NO CHANGES                 []   Previous H&P reviewed, patient examined and there are changes noted.   [name], MD 1/2/201910:44 AM

## 2017-10-06 NOTE — H&P (Signed)
Barbara Marks is an 84 y.o. female.   Chief Complaint: Return of anxiety low mood nervousness lack of appetite poor sleep.  Patient can feel that depressive symptoms which are typical for her are returning for about the last 2 weeks history of long-standing recurrent episodes of depression.  She has been doing quite well HPI: Without maintenance ECT for the past couple of years although she will occasionally have periods of return that have responded to single treatments which is what she is requesting today  Past Medical History:  Diagnosis Date  . Anxiety   . CAD (coronary artery disease) Non-obstructive   a. 2004 Cath: nonobs dzs.  . Cancer ([place])    melanoma skin cancer  . DDD (degenerative disc disease)    a. with chronic right sided back pain - improves after seeing chiropractor.  . Depression    a. h/o ECT  . Diverticulitis   . HTN (hypertension)   . Hyperlipidemia, mixed   . Memory problem    "states memory issues"  . Nephrolithiasis   . Pancreatic cyst    a. 40981 Endoscopic U/S: nl UGI tract, 1-89mm pancreatic cysts, no masses/nodules.  . Premature ventricular contraction    a. managed with propafenone.  . Presence of permanent cardiac pacemaker   . Symptomatic bradycardia    a. s/p MDT PPM in 08/2005;  b. 02/2010 Gen change->MDT Adapta DC PPM, ser # [id number].    Past Surgical History:  Procedure Laterality Date  . APPENDECTOMY  1952  . CATARACT EXTRACTION, BILATERAL    . CHOLECYSTECTOMY  1983  . EUS N/A 06/22/2013   Procedure: UPPER ENDOSCOPIC ULTRASOUND (EUS) LINEAR;  Surgeon: Milus Banister, MD;  Location: [facility];  Service: Endoscopy;  Laterality: N/A;  . EYE SURGERY Bilateral    Cataract Extraction with IOL  . PACEMAKER INSERTION Left    [employer]  . TUMOR EXCISION     And nemamgeomas    Family History  Problem Relation Age of Onset  . Other Mother        died @ 53 - old age.  . Stroke Father        cva after cea in his 21's, died @ 9.    Social History:  reports that  has never smoked. she has never used smokeless tobacco. She reports that she does not drink alcohol or use drugs.  Allergies:  Allergies  Allergen Reactions  . Codeine     unknown  . Simvastatin Other (See Comments)    "leg cramps"     (Not in a hospital admission)  No results found for this or any previous visit (from the past 48 hour(s)). No results found.  Review of Systems  Constitutional: Negative.   HENT: Negative.   Eyes: Negative.   Respiratory: Negative.   Cardiovascular: Negative.   Gastrointestinal: Negative.   Musculoskeletal: Negative.   Skin: Negative.   Neurological: Negative.   Psychiatric/Behavioral: Positive for depression. Negative for hallucinations, memory loss, substance abuse and suicidal ideas. The patient is nervous/anxious and has insomnia.     Blood pressure (!) 168/93, pulse 87, temperature (!) 96.1 F (35.6 C), temperature source Oral, height 5\' 6"  (1.676 m), weight 154 lb (69.9 kg). Physical Exam  Nursing note and vitals reviewed. Constitutional: She appears well-developed and well-nourished.  HENT:  Head: Normocephalic and atraumatic.  Eyes: Conjunctivae are normal. Pupils are equal, round, and reactive to light.  Neck: Normal range of motion.  Cardiovascular: Regular rhythm and normal heart sounds.  Respiratory: Effort normal. No respiratory distress.  GI: Soft.  Musculoskeletal: Normal range of motion.  Neurological: She is alert.  Skin: Skin is warm and dry.  Psychiatric: Her speech is normal and behavior is normal. Judgment and thought content normal. Her mood appears anxious. Cognition and memory are normal. She exhibits a depressed mood.     Assessment/Plan We will do a single treatment today.  [name] is extremely tuned to her own symptoms and will notify us if she feels it is necessary to do another treatment in the near future.  [name], MD 10/06/2017, 10:40 AM

## 2017-10-06 NOTE — Discharge Instructions (Signed)
1)  The drugs that you have been given will stay in your system until tomorrow so for the       next 24 hours you should not:  A. Drive an automobile  B. Make any legal decisions  C. Drink any alcoholic beverages  2)  You may resume your regular meals upon return home.  3)  A responsible adult must take you home.  Someone should stay with you for a few          hours, then be available by phone for the remainder of the treatment day.  4)  You May experience any of the following symptoms:  Headache, Nausea and a dry mouth (due to the medications you were given),  temporary memory loss and some confusion, or sore muscles (a warm bath  should help this).  If you you experience any of these symptoms let us know on                your return visit.  5)  Report any of the following: any acute discomfort, severe headache, or temperature        greater than 100.5 F.   Also report any unusual redness, swelling, drainage, or pain         at your IV site.    You may report Symptoms to:  [place] at [facility]          Phone: [phone], ECT Department           or Dr. Prescott Gum office [phone]  6)  Your next ECT Treatment is When you call us     7)  Nothing to eat or drink after midnight the night before your procedure.  8)  Take     With a sip of water the morning of your procedure.  9)  Other Instructions: Call [phone] to cancel the morning of your procedure due         to illness or emergency.  10) We will call within 72 hours to assess how you are feeling.

## 2017-10-06 NOTE — Transfer of Care (Signed)
Immediate Anesthesia Transfer of Care Note  Patient: Barbara Marks  Procedure(s) Performed: ECT TX  Patient Location: PACU  Anesthesia Type:General  Level of Consciousness: sedated  Airway & Oxygen Therapy: Patient Spontanous Breathing and Patient connected to face mask oxygen  Post-op Assessment: Report given to RN and Post -op Vital signs reviewed and stable  Post vital signs: Reviewed and stable  Last Vitals:  Vitals:   10/06/17 0850 10/06/17 1059  BP: (!) 168/93 (!) 155/77  Pulse: 87 75  Resp:  (!) 23  Temp: (!) 35.6 C (!) 36.3 C  SpO2:  100%    Last Pain:  Vitals:   10/06/17 0850  TempSrc: Oral         Complications: No apparent anesthesia complications

## 2017-10-06 NOTE — Anesthesia Preprocedure Evaluation (Signed)
Anesthesia Evaluation  Patient identified by MRN, date of birth, ID band Patient awake    Reviewed: Allergy & Precautions, NPO status , Patient's Chart, lab work & pertinent test results, reviewed documented beta blocker date and time   Airway Mallampati: III  TM Distance: >3 FB     Dental  (+) Chipped   Pulmonary           Cardiovascular hypertension, Pt. on medications + CAD  + dysrhythmias + pacemaker      Neuro/Psych PSYCHIATRIC DISORDERS Anxiety Depression    GI/Hepatic   Endo/Other    Renal/GU Renal disease     Musculoskeletal  (+) Arthritis ,   Abdominal   Peds  Hematology  (+) anemia ,   Anesthesia Other Findings PVCs.  Reproductive/Obstetrics                             Anesthesia Physical Anesthesia Plan  ASA: II  Anesthesia Plan: General   Post-op Pain Management:    Induction: Intravenous  PONV Risk Score and Plan:   Airway Management Planned:   Additional Equipment:   Intra-op Plan:   Post-operative Plan:   Informed Consent: I have reviewed the patients History and Physical, chart, labs and discussed the procedure including the risks, benefits and alternatives for the proposed anesthesia with the patient or authorized representative who has indicated his/her understanding and acceptance.     Plan Discussed with: CRNA  Anesthesia Plan Comments:         Anesthesia Quick Evaluation

## 2017-10-06 NOTE — Anesthesia Procedure Notes (Signed)
Date/Time: 10/06/2017 10:50 AM Performed by: Dionne Bucy, CRNA Pre-anesthesia Checklist: Patient identified, Emergency Drugs available, Suction available and Patient being monitored Patient Re-evaluated:Patient Re-evaluated prior to induction Oxygen Delivery Method: Circle system utilized Preoxygenation: Pre-oxygenation with 100% oxygen Induction Type: IV induction Ventilation: Mask ventilation without difficulty and Mask ventilation throughout procedure Airway Equipment and Method: Bite block Placement Confirmation: positive ETCO2 Dental Injury: Teeth and Oropharynx as per pre-operative assessment

## 2017-10-20 ENCOUNTER — Other Ambulatory Visit: Payer: Self-pay | Admitting: Psychiatry

## 2017-11-02 DIAGNOSIS — L080 Pyoderma: Secondary | ICD-10-CM | POA: Diagnosis not present

## 2017-11-02 DIAGNOSIS — Z85828 Personal history of other malignant neoplasm of skin: Secondary | ICD-10-CM | POA: Diagnosis not present

## 2017-11-02 DIAGNOSIS — D692 Other nonthrombocytopenic purpura: Secondary | ICD-10-CM | POA: Diagnosis not present

## 2017-11-02 DIAGNOSIS — D485 Neoplasm of uncertain behavior of skin: Secondary | ICD-10-CM | POA: Diagnosis not present

## 2017-11-15 DIAGNOSIS — R102 Pelvic and perineal pain: Secondary | ICD-10-CM | POA: Diagnosis not present

## 2017-11-15 DIAGNOSIS — R140 Abdominal distension (gaseous): Secondary | ICD-10-CM | POA: Diagnosis not present

## 2017-11-15 DIAGNOSIS — K862 Cyst of pancreas: Secondary | ICD-10-CM | POA: Diagnosis not present

## 2017-11-15 DIAGNOSIS — R1031 Right lower quadrant pain: Secondary | ICD-10-CM | POA: Diagnosis not present

## 2017-11-15 DIAGNOSIS — Z87442 Personal history of urinary calculi: Secondary | ICD-10-CM | POA: Diagnosis not present

## 2017-11-15 DIAGNOSIS — Z01812 Encounter for preprocedural laboratory examination: Secondary | ICD-10-CM | POA: Diagnosis not present

## 2017-11-16 DIAGNOSIS — G5702 Lesion of sciatic nerve, left lower limb: Secondary | ICD-10-CM | POA: Diagnosis not present

## 2017-11-16 DIAGNOSIS — M7062 Trochanteric bursitis, left hip: Secondary | ICD-10-CM | POA: Diagnosis not present

## 2017-11-17 ENCOUNTER — Other Ambulatory Visit: Payer: Self-pay | Admitting: Student

## 2017-11-17 DIAGNOSIS — R1031 Right lower quadrant pain: Secondary | ICD-10-CM

## 2017-11-19 ENCOUNTER — Other Ambulatory Visit: Payer: Self-pay | Admitting: Psychiatry

## 2017-11-29 ENCOUNTER — Ambulatory Visit
Admission: RE | Admit: 2017-11-29 | Discharge: 2017-11-29 | Disposition: A | Discharge status: Home / Self Care | Payer: PPO | Source: Ambulatory Visit | Attending: Student | Admitting: Student

## 2017-11-29 DIAGNOSIS — I700 Atherosclerosis of aorta: Secondary | ICD-10-CM | POA: Insufficient documentation

## 2017-11-29 DIAGNOSIS — K5730 Diverticulosis of large intestine without perforation or abscess without bleeding: Secondary | ICD-10-CM | POA: Insufficient documentation

## 2017-11-29 DIAGNOSIS — R1031 Right lower quadrant pain: Secondary | ICD-10-CM

## 2017-11-29 DIAGNOSIS — R102 Pelvic and perineal pain: Secondary | ICD-10-CM | POA: Diagnosis present

## 2017-11-29 DIAGNOSIS — K862 Cyst of pancreas: Secondary | ICD-10-CM | POA: Insufficient documentation

## 2017-11-29 DIAGNOSIS — R140 Abdominal distension (gaseous): Secondary | ICD-10-CM | POA: Diagnosis present

## 2017-11-29 HISTORY — DX: Malignant melanoma of skin, unspecified: C43.9

## 2017-11-29 LAB — POCT I-STAT CREATININE: Creatinine, Ser: 0.9 mg/dL (ref 0.44–1.00)

## 2017-11-29 MED ORDER — IOPAMIDOL (ISOVUE-300) INJECTION 61%
85.0000 mL | Freq: Once | INTRAVENOUS | Status: AC | PRN
  Administered 2017-11-29: 85 mL via INTRAVENOUS

## 2017-12-14 DIAGNOSIS — H353131 Nonexudative age-related macular degeneration, bilateral, early dry stage: Secondary | ICD-10-CM | POA: Diagnosis not present

## 2017-12-29 DIAGNOSIS — M4608 Spinal enthesopathy, sacral and sacrococcygeal region: Secondary | ICD-10-CM | POA: Diagnosis not present

## 2017-12-29 DIAGNOSIS — M545 Low back pain: Secondary | ICD-10-CM | POA: Diagnosis not present

## 2017-12-29 DIAGNOSIS — M9904 Segmental and somatic dysfunction of sacral region: Secondary | ICD-10-CM | POA: Diagnosis not present

## 2017-12-29 DIAGNOSIS — M9903 Segmental and somatic dysfunction of lumbar region: Secondary | ICD-10-CM | POA: Diagnosis not present

## 2017-12-31 DIAGNOSIS — M545 Low back pain: Secondary | ICD-10-CM | POA: Diagnosis not present

## 2017-12-31 DIAGNOSIS — M9904 Segmental and somatic dysfunction of sacral region: Secondary | ICD-10-CM | POA: Diagnosis not present

## 2017-12-31 DIAGNOSIS — M9903 Segmental and somatic dysfunction of lumbar region: Secondary | ICD-10-CM | POA: Diagnosis not present

## 2017-12-31 DIAGNOSIS — M4608 Spinal enthesopathy, sacral and sacrococcygeal region: Secondary | ICD-10-CM | POA: Diagnosis not present

## 2018-01-04 DIAGNOSIS — M9903 Segmental and somatic dysfunction of lumbar region: Secondary | ICD-10-CM | POA: Diagnosis not present

## 2018-01-04 DIAGNOSIS — M545 Low back pain: Secondary | ICD-10-CM | POA: Diagnosis not present

## 2018-01-04 DIAGNOSIS — M4608 Spinal enthesopathy, sacral and sacrococcygeal region: Secondary | ICD-10-CM | POA: Diagnosis not present

## 2018-01-04 DIAGNOSIS — M9904 Segmental and somatic dysfunction of sacral region: Secondary | ICD-10-CM | POA: Diagnosis not present

## 2018-01-05 DIAGNOSIS — H903 Sensorineural hearing loss, bilateral: Secondary | ICD-10-CM | POA: Diagnosis not present

## 2018-01-05 DIAGNOSIS — H6121 Impacted cerumen, right ear: Secondary | ICD-10-CM | POA: Diagnosis not present

## 2018-01-06 ENCOUNTER — Other Ambulatory Visit: Payer: Self-pay | Admitting: Psychiatry

## 2018-01-06 ENCOUNTER — Telehealth: Payer: Self-pay

## 2018-01-06 DIAGNOSIS — M545 Low back pain: Secondary | ICD-10-CM | POA: Diagnosis not present

## 2018-01-06 DIAGNOSIS — M9903 Segmental and somatic dysfunction of lumbar region: Secondary | ICD-10-CM | POA: Diagnosis not present

## 2018-01-06 DIAGNOSIS — M9904 Segmental and somatic dysfunction of sacral region: Secondary | ICD-10-CM | POA: Diagnosis not present

## 2018-01-06 DIAGNOSIS — M4608 Spinal enthesopathy, sacral and sacrococcygeal region: Secondary | ICD-10-CM | POA: Diagnosis not present

## 2018-01-06 MED ORDER — BUPROPION HCL ER (XL) 300 MG PO TB24: 300.0000 mg | ORAL_TABLET | ORAL | 3 refills | Status: DC

## 2018-01-06 NOTE — Telephone Encounter (Signed)
pt called states that she needs a refill on bupropion and that she needs it sent to [facility]. pt is a ect pt.

## 2018-01-06 NOTE — Telephone Encounter (Signed)
Done

## 2018-01-10 DIAGNOSIS — M542 Cervicalgia: Secondary | ICD-10-CM | POA: Diagnosis not present

## 2018-01-18 DIAGNOSIS — I872 Venous insufficiency (chronic) (peripheral): Secondary | ICD-10-CM | POA: Diagnosis not present

## 2018-01-18 DIAGNOSIS — I10 Essential (primary) hypertension: Secondary | ICD-10-CM | POA: Diagnosis not present

## 2018-01-18 DIAGNOSIS — R600 Localized edema: Secondary | ICD-10-CM | POA: Diagnosis not present

## 2018-01-19 DIAGNOSIS — D2262 Melanocytic nevi of left upper limb, including shoulder: Secondary | ICD-10-CM | POA: Diagnosis not present

## 2018-01-19 DIAGNOSIS — L821 Other seborrheic keratosis: Secondary | ICD-10-CM | POA: Diagnosis not present

## 2018-03-02 ENCOUNTER — Telehealth: Payer: Self-pay

## 2018-03-02 ENCOUNTER — Other Ambulatory Visit: Payer: Self-pay | Admitting: Psychiatry

## 2018-03-04 ENCOUNTER — Encounter
Admission: RE | Admit: 2018-03-04 | Discharge: 2018-03-04 | Disposition: A | Discharge status: Home / Self Care | Payer: PPO | Source: Ambulatory Visit | Attending: Psychiatry | Admitting: Psychiatry

## 2018-03-04 ENCOUNTER — Encounter: Payer: Self-pay | Admitting: Anesthesiology

## 2018-03-04 ENCOUNTER — Ambulatory Visit: Payer: Self-pay | Admitting: Anesthesiology

## 2018-03-04 DIAGNOSIS — D649 Anemia, unspecified: Secondary | ICD-10-CM | POA: Diagnosis not present

## 2018-03-04 DIAGNOSIS — I493 Ventricular premature depolarization: Secondary | ICD-10-CM | POA: Insufficient documentation

## 2018-03-04 DIAGNOSIS — I2510 Atherosclerotic heart disease of native coronary artery without angina pectoris: Secondary | ICD-10-CM | POA: Diagnosis not present

## 2018-03-04 DIAGNOSIS — Z87442 Personal history of urinary calculi: Secondary | ICD-10-CM | POA: Insufficient documentation

## 2018-03-04 DIAGNOSIS — Z9842 Cataract extraction status, left eye: Secondary | ICD-10-CM | POA: Diagnosis not present

## 2018-03-04 DIAGNOSIS — Z885 Allergy status to narcotic agent status: Secondary | ICD-10-CM | POA: Insufficient documentation

## 2018-03-04 DIAGNOSIS — E785 Hyperlipidemia, unspecified: Secondary | ICD-10-CM | POA: Diagnosis not present

## 2018-03-04 DIAGNOSIS — Z9841 Cataract extraction status, right eye: Secondary | ICD-10-CM | POA: Diagnosis not present

## 2018-03-04 DIAGNOSIS — Z01810 Encounter for preprocedural cardiovascular examination: Secondary | ICD-10-CM | POA: Diagnosis not present

## 2018-03-04 DIAGNOSIS — F332 Major depressive disorder, recurrent severe without psychotic features: Secondary | ICD-10-CM | POA: Diagnosis not present

## 2018-03-04 DIAGNOSIS — Z85820 Personal history of malignant melanoma of skin: Secondary | ICD-10-CM | POA: Insufficient documentation

## 2018-03-04 DIAGNOSIS — F419 Anxiety disorder, unspecified: Secondary | ICD-10-CM | POA: Diagnosis not present

## 2018-03-04 DIAGNOSIS — E782 Mixed hyperlipidemia: Secondary | ICD-10-CM | POA: Diagnosis not present

## 2018-03-04 DIAGNOSIS — Z888 Allergy status to other drugs, medicaments and biological substances status: Secondary | ICD-10-CM | POA: Insufficient documentation

## 2018-03-04 DIAGNOSIS — I499 Cardiac arrhythmia, unspecified: Secondary | ICD-10-CM | POA: Diagnosis not present

## 2018-03-04 DIAGNOSIS — Z950 Presence of cardiac pacemaker: Secondary | ICD-10-CM | POA: Diagnosis not present

## 2018-03-04 DIAGNOSIS — Z823 Family history of stroke: Secondary | ICD-10-CM | POA: Diagnosis not present

## 2018-03-04 DIAGNOSIS — M1990 Unspecified osteoarthritis, unspecified site: Secondary | ICD-10-CM | POA: Diagnosis not present

## 2018-03-04 DIAGNOSIS — I10 Essential (primary) hypertension: Secondary | ICD-10-CM | POA: Diagnosis not present

## 2018-03-04 DIAGNOSIS — F329 Major depressive disorder, single episode, unspecified: Secondary | ICD-10-CM | POA: Insufficient documentation

## 2018-03-04 DIAGNOSIS — I447 Left bundle-branch block, unspecified: Secondary | ICD-10-CM | POA: Diagnosis not present

## 2018-03-04 DIAGNOSIS — Z9049 Acquired absence of other specified parts of digestive tract: Secondary | ICD-10-CM | POA: Insufficient documentation

## 2018-03-04 MED ORDER — KETOROLAC TROMETHAMINE 30 MG/ML IJ SOLN
30.0000 mg | Freq: Once | INTRAMUSCULAR | Status: AC
  Administered 2018-03-04: 30 mg via INTRAVENOUS

## 2018-03-04 MED ORDER — SODIUM CHLORIDE 0.9 % IV SOLN
500.0000 mL | Freq: Once | INTRAVENOUS | Status: AC
  Administered 2018-03-04: 500 mL via INTRAVENOUS

## 2018-03-04 MED ORDER — METHOHEXITAL SODIUM 100 MG/10ML IV SOSY
PREFILLED_SYRINGE | INTRAVENOUS | Status: DC | PRN
  Administered 2018-03-04: 80 mg via INTRAVENOUS

## 2018-03-04 MED ORDER — SUCCINYLCHOLINE CHLORIDE 200 MG/10ML IV SOSY
PREFILLED_SYRINGE | INTRAVENOUS | Status: DC | PRN
  Administered 2018-03-04: 90 mg via INTRAVENOUS

## 2018-03-04 MED ORDER — SODIUM CHLORIDE 0.9 % IV SOLN
INTRAVENOUS | Status: DC | PRN
  Administered 2018-03-04: 11:00:00 via INTRAVENOUS

## 2018-03-04 MED ORDER — KETOROLAC TROMETHAMINE 30 MG/ML IJ SOLN
INTRAMUSCULAR | Status: AC
  Filled 2018-03-04: qty 1

## 2018-03-04 NOTE — Discharge Instructions (Signed)
1)  The drugs that you have been given will stay in your system until tomorrow so for the       next 24 hours you should not:  A. Drive an automobile  B. Make any legal decisions  C. Drink any alcoholic beverages  2)  You may resume your regular meals upon return home.  3)  A responsible adult must take you home.  Someone should stay with you for a few          hours, then be available by phone for the remainder of the treatment day.  4)  You May experience any of the following symptoms:  Headache, Nausea and a dry mouth (due to the medications you were given),  temporary memory loss and some confusion, or sore muscles (a warm bath  should help this).  If you you experience any of these symptoms let us know on                your return visit.  5)  Report any of the following: any acute discomfort, severe headache, or temperature        greater than 100.5 F.   Also report any unusual redness, swelling, drainage, or pain         at your IV site.    You may report Symptoms to:  [place] at [facility]          Phone: [phone], ECT Department           or Dr. Prescott Gum office [phone]  6)  Your next ECT Treatment is when you are able to call for an appointment   We will call 2 days prior to your scheduled appointment for arrival times.  7)  Nothing to eat or drink after midnight the night before your procedure.  8)  Take      With a sip of water the morning of your procedure.  9)  Other Instructions: Call [phone] to cancel the morning of your procedure due         to illness or emergency.  10) We will call within 72 hours to assess how you are feeling.

## 2018-03-04 NOTE — Anesthesia Post-op Follow-up Note (Signed)
Anesthesia QCDR form completed.        

## 2018-03-04 NOTE — Anesthesia Postprocedure Evaluation (Signed)
Anesthesia Post Note  Patient: Barbara Marks  Procedure(s) Performed: ECT TX  Patient location during evaluation: PACU Anesthesia Type: General Level of consciousness: awake and alert Pain management: pain level controlled Vital Signs Assessment: post-procedure vital signs reviewed and stable Respiratory status: spontaneous breathing, nonlabored ventilation, respiratory function stable and patient connected to nasal cannula oxygen Cardiovascular status: blood pressure returned to baseline and stable Postop Assessment: no apparent nausea or vomiting Anesthetic complications: no     Last Vitals:  Vitals:   03/04/18 1135 03/04/18 1147  BP: (!) 148/86 138/84  Pulse: 83 82  Resp:  16  Temp:  36.6 C  SpO2:      Last Pain:  Vitals:   03/04/18 1147  TempSrc: Oral  PainSc:                  Precious Haws Teagan Ozawa

## 2018-03-04 NOTE — Anesthesia Preprocedure Evaluation (Addendum)
Anesthesia Evaluation  Patient identified by MRN, date of birth, ID band Patient awake    Reviewed: Allergy & Precautions, H&P , NPO status , Patient's Chart, lab work & pertinent test results, reviewed documented beta blocker date and time   History of Anesthesia Complications Negative for: history of anesthetic complications  Airway Mallampati: II  TM Distance: >3 FB Neck ROM: full    Dental  (+) Chipped   Pulmonary neg shortness of breath,           Cardiovascular Exercise Tolerance: Good hypertension, Pt. on medications + CAD  + dysrhythmias + pacemaker      Neuro/Psych PSYCHIATRIC DISORDERS Anxiety Depression negative neurological ROS     GI/Hepatic negative GI ROS, Neg liver ROS,   Endo/Other  negative endocrine ROS  Renal/GU Renal disease  negative genitourinary   Musculoskeletal  (+) Arthritis ,   Abdominal   Peds  Hematology negative hematology ROS (+) anemia ,   Anesthesia Other Findings Past Medical History: No date: Anxiety Non-obstructive: CAD (coronary artery disease)     Comment:  a. 2004 Cath: nonobs dzs. No date: DDD (degenerative disc disease)     Comment:  a. with chronic right sided back pain - improves after               seeing chiropractor. No date: Depression     Comment:  a. h/o ECT No date: Diverticulitis No date: HTN (hypertension) No date: Hyperlipidemia, mixed 2008: Melanoma of skin (HCC)     Comment:  resected from Left arm  No date: Memory problem     Comment:  "states memory issues" No date: Nephrolithiasis No date: Pancreatic cyst     Comment:  a. 92014 Endoscopic U/S: nl UGI tract, 1-2mm pancreatic               cysts, no masses/nodules. No date: Premature ventricular contraction     Comment:  a. managed with propafenone. No date: Presence of permanent cardiac pacemaker No date: Symptomatic bradycardia     Comment:  a. s/p MDT PPM in 08/2005;  b. 02/2010 Gen  change->MDT               Adapta DC PPM, ser # [id number].   Reproductive/Obstetrics negative OB ROS                                                              Anesthesia Evaluation  Patient identified by MRN, date of birth, ID band Patient awake    Reviewed: Allergy & Precautions, H&P , NPO status , Patient's Chart, lab work & pertinent test results, reviewed documented beta blocker date and time   Airway Mallampati: II  TM Distance: >3 FB Neck ROM: limited    Dental  (+) Poor Dentition, Chipped, Missing, Partial Lower, Partial Upper   Pulmonary neg pulmonary ROS,    Pulmonary exam normal        Cardiovascular hypertension, + CAD  negative cardio ROS Normal cardiovascular exam+ dysrhythmias + pacemaker  Rate:Normal     Neuro/Psych PSYCHIATRIC DISORDERS Anxiety Depression negative neurological ROS  negative psych ROS   GI/Hepatic negative GI ROS, Neg liver ROS,   Endo/Other  negative endocrine ROS  Renal/GU Renal diseasenegative Renal ROS  negative genitourinary   Musculoskeletal  (+) Arthritis ,     Abdominal   Peds  Hematology negative hematology ROS (+) anemia ,   Anesthesia Other Findings Past Medical History: No date: Anxiety Non-obstructive: CAD (coronary artery disease)     Comment: a. 2004 Cath: nonobs dzs. No date: Cancer ([facility]CC)     Comment: melanoma skin cancer No date: DDD (degenerative disc disease)     Comment: a. with chronic right sided back pain -               improves after seeing chiropractor. No date: Depression     Comment: a. h/o ECT No date: Diverticulitis No date: HTN (hypertension) No date: Hyperlipidemia, mixed No date: Memory problem     Comment: "states memory issues" No date: Nephrolithiasis No date: Pancreatic cyst     Comment: a. 92014 Endoscopic U/S: nl UGI tract, 1-2mm               pancreatic cysts, no masses/nodules. No date: Premature ventricular contraction     Comment: a.  managed with propafenone. No date: Presence of permanent cardiac pacemaker No date: Symptomatic bradycardia     Comment: a. s/p MDT PPM in 08/2005;  b. 02/2010 Gen               change->MDT Adapta DC PPM, ser # [id number]. Past Surgical History: 1952: APPENDECTOMY No date: CATARACT EXTRACTION, BILATERAL 1983: CHOLECYSTECTOMY 06/22/2013: EUS N/A     Comment: Procedure: UPPER ENDOSCOPIC ULTRASOUND (EUS)               LINEAR;  Surgeon: Daniel P Jacobs, MD;                Location: [facility];  Service: Endoscopy;                Laterality: N/A; No date: EYE SURGERY Bilateral     Comment: Cataract Extraction with IOL No date: PACEMAKER INSERTION Left     Comment: [employer] No date: TUMOR EXCISION     Comment: And nemamgeomas BMI    Body Mass Index:  25.02 kg/m     Reproductive/Obstetrics                             Anesthesia Physical  Anesthesia Plan  ASA: III  Anesthesia Plan: General   Post-op Pain Management:    Induction:   Airway Management Planned:   Additional Equipment:   Intra-op Plan:   Post-operative Plan:   Informed Consent: I have reviewed the patients History and Physical, chart, labs and discussed the procedure including the risks, benefits and alternatives for the proposed anesthesia with the patient or authorized representative who has indicated his/her understanding and acceptance.   Dental Advisory Given  Plan Discussed with: CRNA  Anesthesia Plan Comments:         Anesthesia Quick Evaluation                                   Anesthesia Evaluation  Patient identified by MRN, date of birth, ID band Patient awake    Reviewed: Allergy & Precautions, H&P , NPO status , Patient's Chart, lab work & pertinent test results, reviewed documented beta blocker date and time   Airway Mallampati: II  TM Distance: >3 FB Neck ROM: limited    Dental  (+) Poor Dentition, Chipped, Missing, Partial Lower, Partial  Upper   Pulmonary neg pulmonary ROS,      Pulmonary exam normal        Cardiovascular hypertension, + CAD  negative cardio ROS Normal cardiovascular exam+ dysrhythmias + pacemaker  Rate:Normal     Neuro/Psych PSYCHIATRIC DISORDERS Anxiety Depression negative neurological ROS  negative psych ROS   GI/Hepatic negative GI ROS, Neg liver ROS,   Endo/Other  negative endocrine ROS  Renal/GU Renal diseasenegative Renal ROS  negative genitourinary   Musculoskeletal  (+) Arthritis ,   Abdominal   Peds  Hematology negative hematology ROS (+) anemia ,   Anesthesia Other Findings Past Medical History: No date: Anxiety Non-obstructive: CAD (coronary artery disease)     Comment: a. 2004 Cath: nonobs dzs. No date: Cancer (HCC)     Comment: melanoma skin cancer No date: DDD (degenerative disc disease)     Comment: a. with chronic right sided back pain -               improves after seeing chiropractor. No date: Depression     Comment: a. h/o ECT No date: Diverticulitis No date: HTN (hypertension) No date: Hyperlipidemia, mixed No date: Memory problem     Comment: "states memory issues" No date: Nephrolithiasis No date: Pancreatic cyst     Comment: a. 92014 Endoscopic U/S: nl UGI tract, 1-2mm               pancreatic cysts, no masses/nodules. No date: Premature ventricular contraction     Comment: a. managed with propafenone. No date: Presence of permanent cardiac pacemaker No date: Symptomatic bradycardia     Comment: a. s/p MDT PPM in 08/2005;  b. 02/2010 Gen               change->MDT Adapta DC PPM, ser # [id number]. Past Surgical History: 1952: APPENDECTOMY No date: CATARACT EXTRACTION, BILATERAL 1983: CHOLECYSTECTOMY 06/22/2013: EUS N/A     Comment: Procedure: UPPER ENDOSCOPIC ULTRASOUND (EUS)               LINEAR;  Surgeon: Daniel P Jacobs, MD;                Location: [facility];  Service: Endoscopy;                Laterality: N/A; No date: EYE SURGERY  Bilateral     Comment: Cataract Extraction with IOL No date: PACEMAKER INSERTION Left     Comment: [employer] No date: TUMOR EXCISION     Comment: And nemamgeomas BMI    Body Mass Index:  25.02 kg/m     Reproductive/Obstetrics                             Anesthesia Physical  Anesthesia Plan  ASA: III  Anesthesia Plan: General   Post-op Pain Management:    Induction:   Airway Management Planned:   Additional Equipment:   Intra-op Plan:   Post-operative Plan:   Informed Consent: I have reviewed the patients History and Physical, chart, labs and discussed the procedure including the risks, benefits and alternatives for the proposed anesthesia with the patient or authorized representative who has indicated his/her understanding and acceptance.   Dental Advisory Given  Plan Discussed with: CRNA  Anesthesia Plan Comments:         Anesthesia Quick Evaluation  Anesthesia Physical  Anesthesia Plan  ASA: III  Anesthesia Plan: General   Post-op Pain Management:    Induction: Intravenous  PONV Risk Score and Plan:   Airway Management Planned: Mask    Additional Equipment:   Intra-op Plan:   Post-operative Plan:   Informed Consent: I have reviewed the patients History and Physical, chart, labs and discussed the procedure including the risks, benefits and alternatives for the proposed anesthesia with the patient or authorized representative who has indicated his/her understanding and acceptance.   Dental Advisory Given  Plan Discussed with: CRNA  Anesthesia Plan Comments: (Patient consented for risks of anesthesia including but not limited to:  - adverse reactions to medications - risk of intubation if required - damage to teeth, lips or other oral mucosa - sore throat or hoarseness - Damage to heart, brain, lungs or loss of life  Patient voiced understanding.)        Anesthesia Quick Evaluation  

## 2018-03-04 NOTE — Transfer of Care (Signed)
Immediate Anesthesia Transfer of Care Note  Patient: Barbara Marks  Procedure(s) Performed: ECT TX  Patient Location: PACU  Anesthesia Type:General  Level of Consciousness: sedated  Airway & Oxygen Therapy: Patient Spontanous Breathing and Patient connected to face mask oxygen  Post-op Assessment: Report given to RN and Patient moving all extremities  Post vital signs: Reviewed and stable  Last Vitals:  Vitals Value Taken Time  BP    Temp    Pulse    Resp    SpO2      Last Pain:  Vitals:   03/04/18 0828  TempSrc:   PainSc: 0-No pain         Complications: No apparent anesthesia complications

## 2018-03-04 NOTE — H&P (Signed)
Barbara Marks is an 84 y.o. female.   Chief Complaint: Patient is feeling more down depressed not sleeping well not eating well not enjoying things normally. HPI: History of recurrent depression which responds well to ECT and medication management  Past Medical History:  Diagnosis Date  . Anxiety   . CAD (coronary artery disease) Non-obstructive   a. 2004 Cath: nonobs dzs.  . DDD (degenerative disc disease)    a. with chronic right sided back pain - improves after seeing chiropractor.  . Depression    a. h/o ECT  . Diverticulitis   . HTN (hypertension)   . Hyperlipidemia, mixed   . Melanoma of skin ([place]) 2008   resected from Left arm   . Memory problem    "states memory issues"  . Nephrolithiasis   . Pancreatic cyst    a. [id number] Endoscopic U/S: nl UGI tract, 1-20mm pancreatic cysts, no masses/nodules.  . Premature ventricular contraction    a. managed with propafenone.  . Presence of permanent cardiac pacemaker   . Symptomatic bradycardia    a. s/p MDT PPM in 08/2005;  b. 02/2010 Gen change->MDT Adapta DC PPM, ser # [id number] H.    Past Surgical History:  Procedure Laterality Date  . APPENDECTOMY  1952  . CATARACT EXTRACTION, BILATERAL    . CHOLECYSTECTOMY  1983  . EUS N/A 06/22/2013   Procedure: UPPER ENDOSCOPIC ULTRASOUND (EUS) LINEAR;  Surgeon: Milus Banister, MD;  Location: [facility];  Service: Endoscopy;  Laterality: N/A;  . EYE SURGERY Bilateral    Cataract Extraction with IOL  . PACEMAKER INSERTION Left    [employer]  . TUMOR EXCISION     And nemamgeomas    Family History  Problem Relation Age of Onset  . Other Mother        died @ 29 - old age.  . Stroke Father        cva after cea in his 14's, died @ 2.   Social History:  reports that she has never smoked. She has never used smokeless tobacco. She reports that she does not drink alcohol or use drugs.  Allergies:  Allergies  Allergen Reactions  . Codeine     unknown  . Simvastatin Other (See  Comments)    "leg cramps"     (Not in a hospital admission)  No results found for this or any previous visit (from the past 48 hour(s)). No results found.  Review of Systems  Constitutional: Positive for weight loss.  HENT: Negative.   Eyes: Negative.   Respiratory: Negative.   Cardiovascular: Negative.   Gastrointestinal: Negative.   Musculoskeletal: Negative.   Skin: Negative.   Neurological: Negative.   Psychiatric/Behavioral: Positive for depression. Negative for hallucinations, memory loss, substance abuse and suicidal ideas. The patient is nervous/anxious and has insomnia.     Blood pressure (!) 151/83, pulse 90, temperature (!) 97 F (36.1 C), temperature source Oral, resp. rate 18, height 5\' 6"  (1.676 m), weight 70.8 kg (156 lb), SpO2 98 %. Physical Exam  Nursing note and vitals reviewed. Constitutional: She appears well-developed and well-nourished.  HENT:  Head: Normocephalic and atraumatic.  Eyes: Pupils are equal, round, and reactive to light. Conjunctivae are normal.  Neck: Normal range of motion.  Cardiovascular: Regular rhythm and normal heart sounds.  Respiratory: Effort normal.  GI: Soft.  Musculoskeletal: Normal range of motion.  Neurological: She is alert.  Skin: Skin is warm and dry.  Psychiatric: Judgment normal. Her affect is blunt. Her speech  is delayed. She is slowed. Thought content is not paranoid. Cognition and memory are normal. She exhibits a depressed mood. She expresses no homicidal and no suicidal ideation.     Assessment/Plan Treatment today and then as usual we discuss the follow-up plan.  If the patient is not feeling significantly better she can return for another treatment on Monday otherwise I would like to see her back in my clinic in the next couple weeks  [name], MD 03/04/2018, 10:56 AM

## 2018-03-04 NOTE — Procedures (Signed)
ECT SERVICES Physician's Interval Evaluation & Treatment Note  Patient Identification: MERCED BROUGHAM MRN:  [id number] Date of Evaluation:  03/04/2018 TX #: 5  MADRS:   MMSE:   P.E. Findings:  No change except that she has lost several pounds.  Heart and lungs normal.  EKG was done this morning and is normal and unremarkable  Psychiatric Interval Note:  Mood is been down less enjoyment in things lower energy poor sleep  Subjective:  Patient is a 84 y.o. female seen for evaluation for Electroconvulsive Therapy. She is aware that she is feeling worse  Treatment Summary:   []   Right Unilateral             [x]  Bilateral   % Energy : 1.0 ms 75%   Impedance: 2250 ohms  Seizure Energy Index: 4381 V squared  Postictal Suppression Index: 87%  Seizure Concordance Index: 91%  Medications  Pre Shock: Toradol 30 mg Brevital 80 mg succinylcholine 90 mg  Post Shock:    Seizure Duration: 35 seconds by EMG 65 seconds by EEG   Comments: Patient will be tentatively penciled in on the schedule for Monday and then will let us know if she does not feel she needs it.  Lungs:  [x]   Clear to auscultation               []  Other:   Heart:    [x]   Regular rhythm             []  irregular rhythm    [x]   Previous H&P reviewed, patient examined and there are NO CHANGES                 []   Previous H&P reviewed, patient examined and there are changes noted.   [name], MD 5/31/201910:57 AM

## 2018-03-07 ENCOUNTER — Telehealth: Payer: Self-pay

## 2018-03-15 ENCOUNTER — Other Ambulatory Visit: Payer: Self-pay | Admitting: Psychiatry

## 2018-03-16 ENCOUNTER — Encounter: Payer: Self-pay | Admitting: Registered Nurse

## 2018-03-16 ENCOUNTER — Encounter
Admission: RE | Admit: 2018-03-16 | Discharge: 2018-03-16 | Disposition: A | Discharge status: Home / Self Care | Payer: PPO | Source: Ambulatory Visit | Attending: Psychiatry | Admitting: Psychiatry

## 2018-03-16 DIAGNOSIS — F329 Major depressive disorder, single episode, unspecified: Secondary | ICD-10-CM | POA: Insufficient documentation

## 2018-03-16 DIAGNOSIS — F332 Major depressive disorder, recurrent severe without psychotic features: Secondary | ICD-10-CM

## 2018-03-16 DIAGNOSIS — I10 Essential (primary) hypertension: Secondary | ICD-10-CM | POA: Diagnosis not present

## 2018-03-16 DIAGNOSIS — I2510 Atherosclerotic heart disease of native coronary artery without angina pectoris: Secondary | ICD-10-CM | POA: Diagnosis not present

## 2018-03-16 DIAGNOSIS — E782 Mixed hyperlipidemia: Secondary | ICD-10-CM | POA: Diagnosis not present

## 2018-03-16 MED ORDER — SODIUM CHLORIDE 0.9 % IV SOLN
500.0000 mL | Freq: Once | INTRAVENOUS | Status: AC
  Administered 2018-03-16: 500 mL via INTRAVENOUS

## 2018-03-16 MED ORDER — SODIUM CHLORIDE 0.9 % IV SOLN
INTRAVENOUS | Status: DC | PRN
  Administered 2018-03-16: 11:00:00 via INTRAVENOUS

## 2018-03-16 MED ORDER — SUCCINYLCHOLINE CHLORIDE 20 MG/ML IJ SOLN
INTRAMUSCULAR | Status: DC | PRN
  Administered 2018-03-16: 90 mg via INTRAVENOUS

## 2018-03-16 MED ORDER — METHOHEXITAL SODIUM 100 MG/10ML IV SOSY
PREFILLED_SYRINGE | INTRAVENOUS | Status: DC | PRN
  Administered 2018-03-16: 80 mg via INTRAVENOUS

## 2018-03-16 NOTE — Procedures (Signed)
ECT SERVICES Physician's Interval Evaluation & Treatment Note  Patient Identification: Barbara Marks MRN:  [id number] Date of Evaluation:  03/16/2018 TX #: 6  MADRS:   MMSE:   P.E. Findings:  No change to physical exam although she still is underweight.  Psychiatric Interval Note:  Mood is still depressed with low energy motivation and lack of appetite  Subjective:  Patient is a 84 y.o. female seen for evaluation for Electroconvulsive Therapy. Still feeling not right.  No psychosis no active suicidal thought  Treatment Summary:   []   Right Unilateral             [x]  Bilateral   % Energy : 1.0 ms 75%   Impedance: 1540 ohms  Seizure Energy Index: 4653 V squared  Postictal Suppression Index: 77%  Seizure Concordance Index: 92%  Medications  Pre Shock: Toradol 30 mg Brevital 80 mg succinylcholine 90 mg  Post Shock:    Seizure Duration: EMG 32 seconds, EEG 61 seconds   Comments: Follow-up on Monday  Lungs:  [x]   Clear to auscultation               []  Other:   Heart:    [x]   Regular rhythm             []  irregular rhythm    [x]   Previous H&P reviewed, patient examined and there are NO CHANGES                 []   Previous H&P reviewed, patient examined and there are changes noted.   [name], MD 6/12/201911:09 AM

## 2018-03-16 NOTE — H&P (Signed)
Barbara Marks is an 84 y.o. female.   Chief Complaint: Still feeling depressed down somewhat negative low energy not eating well. HPI: History of recurrent severe depression with a good history of response to ECT.  Past Medical History:  Diagnosis Date  . Anxiety   . CAD (coronary artery disease) Non-obstructive   a. 2004 Cath: nonobs dzs.  . DDD (degenerative disc disease)    a. with chronic right sided back pain - improves after seeing chiropractor.  . Depression    a. h/o ECT  . Diverticulitis   . HTN (hypertension)   . Hyperlipidemia, mixed   . Melanoma of skin ([place]) 2008   resected from Left arm   . Memory problem    "states memory issues"  . Nephrolithiasis   . Pancreatic cyst    a. [id number] Endoscopic U/S: nl UGI tract, 1-7mm pancreatic cysts, no masses/nodules.  . Premature ventricular contraction    a. managed with propafenone.  . Presence of permanent cardiac pacemaker   . Symptomatic bradycardia    a. s/p MDT PPM in 08/2005;  b. 02/2010 Gen change->MDT Adapta DC PPM, ser # [id number] H.    Past Surgical History:  Procedure Laterality Date  . APPENDECTOMY  1952  . CATARACT EXTRACTION, BILATERAL    . CHOLECYSTECTOMY  1983  . EUS N/A 06/22/2013   Procedure: UPPER ENDOSCOPIC ULTRASOUND (EUS) LINEAR;  Surgeon: Milus Banister, MD;  Location: [facility];  Service: Endoscopy;  Laterality: N/A;  . EYE SURGERY Bilateral    Cataract Extraction with IOL  . PACEMAKER INSERTION Left    [employer]  . TUMOR EXCISION     And nemamgeomas    Family History  Problem Relation Age of Onset  . Other Mother        died @ 23 - old age.  . Stroke Father        cva after cea in his 57's, died @ 86.   Social History:  reports that she has never smoked. She has never used smokeless tobacco. She reports that she does not drink alcohol or use drugs.  Allergies:  Allergies  Allergen Reactions  . Codeine     unknown  . Simvastatin Other (See Comments)    "leg cramps"     (Not  in a hospital admission)  No results found for this or any previous visit (from the past 48 hour(s)). No results found.  Review of Systems  Constitutional: Positive for weight loss.  HENT: Negative.   Eyes: Negative.   Respiratory: Negative.   Cardiovascular: Negative.   Gastrointestinal: Negative.   Musculoskeletal: Negative.   Skin: Negative.   Neurological: Negative.   Psychiatric/Behavioral: Positive for depression. Negative for hallucinations, memory loss, substance abuse and suicidal ideas. The patient has insomnia. The patient is not nervous/anxious.     Blood pressure (!) 169/91, pulse (!) 101, temperature 97.7 F (36.5 C), temperature source Oral, resp. rate 16, height 5\' 6"  (1.676 m), weight 70.3 kg (155 lb), SpO2 99 %. Physical Exam  Nursing note and vitals reviewed. Constitutional: She appears well-developed and well-nourished.  HENT:  Head: Normocephalic and atraumatic.  Eyes: Pupils are equal, round, and reactive to light. Conjunctivae are normal.  Neck: Normal range of motion.  Cardiovascular: Regular rhythm and normal heart sounds.  Respiratory: Effort normal. No respiratory distress.  GI: Soft.  Musculoskeletal: Normal range of motion.  Neurological: She is alert.  Skin: Skin is warm and dry.  Psychiatric: Judgment normal. Her affect is blunt. Her  speech is delayed. She is slowed. Thought content is not paranoid. Cognition and memory are normal. She expresses no homicidal and no suicidal ideation.     Assessment/Plan Patient is receiving another treatment today and we have decided on having another treatment scheduled for Monday to try and get full improvement.  [name], MD 03/16/2018, 11:08 AM

## 2018-03-16 NOTE — Anesthesia Preprocedure Evaluation (Signed)
Anesthesia Evaluation  Patient identified by MRN, date of birth, ID band Patient awake    Reviewed: Allergy & Precautions, H&P , NPO status , Patient's Chart, lab work & pertinent test results, reviewed documented beta blocker date and time   History of Anesthesia Complications Negative for: history of anesthetic complications  Airway Mallampati: II  TM Distance: >3 FB Neck ROM: full    Dental  (+) Chipped   Pulmonary neg shortness of breath,           Cardiovascular Exercise Tolerance: Good hypertension, Pt. on medications + CAD  + dysrhythmias + pacemaker      Neuro/Psych PSYCHIATRIC DISORDERS Anxiety Depression negative neurological ROS     GI/Hepatic negative GI ROS, Neg liver ROS,   Endo/Other  negative endocrine ROS  Renal/GU Renal disease  negative genitourinary   Musculoskeletal  (+) Arthritis ,   Abdominal   Peds  Hematology negative hematology ROS (+) anemia ,   Anesthesia Other Findings   Reproductive/Obstetrics negative OB ROS                                                              Anesthesia Evaluation  Patient identified by MRN, date of birth, ID band Patient awake    Reviewed: Allergy & Precautions, H&P , NPO status , Patient's Chart, lab work & pertinent test results, reviewed documented beta blocker date and time   Airway Mallampati: II  TM Distance: >3 FB Neck ROM: limited    Dental  (+) Poor Dentition, Chipped, Missing, Partial Lower, Partial Upper   Pulmonary neg pulmonary ROS,    Pulmonary exam normal        Cardiovascular hypertension, + CAD  negative cardio ROS Normal cardiovascular exam+ dysrhythmias + pacemaker  Rate:Normal     Neuro/Psych PSYCHIATRIC DISORDERS Anxiety Depression negative neurological ROS  negative psych ROS   GI/Hepatic negative GI ROS, Neg liver ROS,   Endo/Other  negative endocrine ROS  Renal/GU Renal  diseasenegative Renal ROS  negative genitourinary   Musculoskeletal  (+) Arthritis ,   Abdominal   Peds  Hematology negative hematology ROS (+) anemia ,   Anesthesia Other Findings Past Medical History: No date: Anxiety Non-obstructive: CAD (coronary artery disease)     Comment: a. 2004 Cath: nonobs dzs. No date: Cancer [facility])     Comment: melanoma skin cancer No date: DDD (degenerative disc disease)     Comment: a. with chronic right sided back pain -               improves after seeing chiropractor. No date: Depression     Comment: a. h/o ECT No date: Diverticulitis No date: HTN (hypertension) No date: Hyperlipidemia, mixed No date: Memory problem     Comment: "states memory issues" No date: Nephrolithiasis No date: Pancreatic cyst     Comment: a. [id number] Endoscopic U/S: nl UGI tract, 1-50mm               pancreatic cysts, no masses/nodules. No date: Premature ventricular contraction     Comment: a. managed with propafenone. No date: Presence of permanent cardiac pacemaker No date: Symptomatic bradycardia     Comment: a. s/p MDT PPM in 08/2005;  b. 02/2010 Gen  change->MDT Adapta DC PPM, ser # [id number] H. Past Surgical History: 1952: APPENDECTOMY No date: CATARACT EXTRACTION, BILATERAL 1983: CHOLECYSTECTOMY 06/22/2013: EUS N/A     Comment: Procedure: UPPER ENDOSCOPIC ULTRASOUND (EUS)               LINEAR;  Surgeon: Milus Banister, MD;                Location: [facility];  Service: Endoscopy;                Laterality: N/A; No date: EYE SURGERY Bilateral     Comment: Cataract Extraction with IOL No date: PACEMAKER INSERTION Left     Comment: [employer] No date: TUMOR EXCISION     Comment: And nemamgeomas BMI    Body Mass Index:  25.02 kg/m     Reproductive/Obstetrics                             Anesthesia Physical  Anesthesia Plan  ASA: III  Anesthesia Plan: General   Post-op Pain Management:    Induction:    Airway Management Planned:   Additional Equipment:   Intra-op Plan:   Post-operative Plan:   Informed Consent: I have reviewed the patients History and Physical, chart, labs and discussed the procedure including the risks, benefits and alternatives for the proposed anesthesia with the patient or authorized representative who has indicated his/her understanding and acceptance.   Dental Advisory Given  Plan Discussed with: CRNA  Anesthesia Plan Comments:         Anesthesia Quick Evaluation                                   Anesthesia Evaluation  Patient identified by MRN, date of birth, ID band Patient awake    Reviewed: Allergy & Precautions, H&P , NPO status , Patient's Chart, lab work & pertinent test results, reviewed documented beta blocker date and time   Airway Mallampati: II  TM Distance: >3 FB Neck ROM: limited    Dental  (+) Poor Dentition, Chipped, Missing, Partial Lower, Partial Upper   Pulmonary neg pulmonary ROS,    Pulmonary exam normal        Cardiovascular hypertension, + CAD  negative cardio ROS Normal cardiovascular exam+ dysrhythmias + pacemaker  Rate:Normal     Neuro/Psych PSYCHIATRIC DISORDERS Anxiety Depression negative neurological ROS  negative psych ROS   GI/Hepatic negative GI ROS, Neg liver ROS,   Endo/Other  negative endocrine ROS  Renal/GU Renal diseasenegative Renal ROS  negative genitourinary   Musculoskeletal  (+) Arthritis ,   Abdominal   Peds  Hematology negative hematology ROS (+) anemia ,   Anesthesia Other Findings Past Medical History: No date: Anxiety Non-obstructive: CAD (coronary artery disease)     Comment: a. 2004 Cath: nonobs dzs. No date: Cancer [facility])     Comment: melanoma skin cancer No date: DDD (degenerative disc disease)     Comment: a. with chronic right sided back pain -               improves after seeing chiropractor. No date: Depression     Comment: a. h/o ECT No date:  Diverticulitis No date: HTN (hypertension) No date: Hyperlipidemia, mixed No date: Memory problem     Comment: "states memory issues" No date: Nephrolithiasis No date: Pancreatic cyst     Comment: a. [id number] Endoscopic U/S:  nl UGI tract, 1-39mm               pancreatic cysts, no masses/nodules. No date: Premature ventricular contraction     Comment: a. managed with propafenone. No date: Presence of permanent cardiac pacemaker No date: Symptomatic bradycardia     Comment: a. s/p MDT PPM in 08/2005;  b. 02/2010 Gen               change->MDT Adapta DC PPM, ser # [id number] H. Past Surgical History: 1952: APPENDECTOMY No date: CATARACT EXTRACTION, BILATERAL 1983: CHOLECYSTECTOMY 06/22/2013: EUS N/A     Comment: Procedure: UPPER ENDOSCOPIC ULTRASOUND (EUS)               LINEAR;  Surgeon: Milus Banister, MD;                Location: [facility];  Service: Endoscopy;                Laterality: N/A; No date: EYE SURGERY Bilateral     Comment: Cataract Extraction with IOL No date: PACEMAKER INSERTION Left     Comment: [employer] No date: TUMOR EXCISION     Comment: And nemamgeomas BMI    Body Mass Index:  25.02 kg/m     Reproductive/Obstetrics                             Anesthesia Physical  Anesthesia Plan  ASA: III  Anesthesia Plan: General   Post-op Pain Management:    Induction:   Airway Management Planned:   Additional Equipment:   Intra-op Plan:   Post-operative Plan:   Informed Consent: I have reviewed the patients History and Physical, chart, labs and discussed the procedure including the risks, benefits and alternatives for the proposed anesthesia with the patient or authorized representative who has indicated his/her understanding and acceptance.   Dental Advisory Given  Plan Discussed with: CRNA  Anesthesia Plan Comments:         Anesthesia Quick Evaluation  Anesthesia Physical  Anesthesia Plan  ASA: III  Anesthesia  Plan: General   Post-op Pain Management:    Induction: Intravenous  PONV Risk Score and Plan:   Airway Management Planned: Mask  Additional Equipment:   Intra-op Plan:   Post-operative Plan:   Informed Consent: I have reviewed the patients History and Physical, chart, labs and discussed the procedure including the risks, benefits and alternatives for the proposed anesthesia with the patient or authorized representative who has indicated his/her understanding and acceptance.     Plan Discussed with: CRNA  Anesthesia Plan Comments:         Anesthesia Quick Evaluation

## 2018-03-16 NOTE — Anesthesia Procedure Notes (Signed)
Performed by: Avneet Ashmore, CRNA Pre-anesthesia Checklist: Patient identified, Patient being monitored, Timeout performed, Emergency Drugs available and Suction available Patient Re-evaluated:Patient Re-evaluated prior to induction Oxygen Delivery Method: Circle system utilized Preoxygenation: Pre-oxygenation with 100% oxygen Ventilation: Mask ventilation without difficulty Airway Equipment and Method: Bite block Dental Injury: Teeth and Oropharynx as per pre-operative assessment        

## 2018-03-16 NOTE — Transfer of Care (Signed)
Immediate Anesthesia Transfer of Care Note  Patient: Barbara Marks  Procedure(s) Performed: ECT TX  Patient Location: PACU  Anesthesia Type:General  Level of Consciousness: sedated  Airway & Oxygen Therapy: Patient Spontanous Breathing and Patient connected to face mask oxygen  Post-op Assessment: Report given to RN and Post -op Vital signs reviewed and stable  Post vital signs: Reviewed and stable  Last Vitals:  Vitals Value Taken Time  BP    Temp    Pulse    Resp    SpO2      Last Pain:  Vitals:   03/16/18 0836  TempSrc: Oral  PainSc: 0-No pain         Complications: No apparent anesthesia complications

## 2018-03-16 NOTE — Anesthesia Postprocedure Evaluation (Signed)
Anesthesia Post Note  Patient: Barbara Marks  Procedure(s) Performed: ECT TX  Patient location during evaluation: PACU Anesthesia Type: General Level of consciousness: awake and alert Pain management: pain level controlled Vital Signs Assessment: post-procedure vital signs reviewed and stable Respiratory status: spontaneous breathing and respiratory function stable Cardiovascular status: stable Anesthetic complications: no     Last Vitals:  Vitals:   03/16/18 1125 03/16/18 1135  BP: (!) 146/81 (!) 146/81  Pulse: 87 87  Resp:  (!) 23  Temp:    SpO2:  96%    Last Pain:  Vitals:   03/16/18 1125  TempSrc:   PainSc: 0-No pain                 KEPHART,WILLIAM K

## 2018-03-16 NOTE — Discharge Instructions (Signed)
1)  The drugs that you have been given will stay in your system until tomorrow so for the       next 24 hours you should not:  A. Drive an automobile  B. Make any legal decisions  C. Drink any alcoholic beverages  2)  You may resume your regular meals upon return home.  3)  A responsible adult must take you home.  Someone should stay with you for a few          hours, then be available by phone for the remainder of the treatment day.  4)  You May experience any of the following symptoms:  Headache, Nausea and a dry mouth (due to the medications you were given),  temporary memory loss and some confusion, or sore muscles (a warm bath  should help this).  If you you experience any of these symptoms let us know on                your return visit.  5)  Report any of the following: any acute discomfort, severe headache, or temperature        greater than 100.5 F.   Also report any unusual redness, swelling, drainage, or pain         at your IV site.    You may report Symptoms to:  [facility]          Phone: [phone], ECT Department           or Dr. Prescott Gum office [phone]  6)  Your next ECT Treatment is Day Monday  Date March 18, 2018 at 0830  We will call 2 days prior to your scheduled appointment for arrival times.  7)  Nothing to eat or drink after midnight the night before your procedure.  8)  Take amlodipine    With a sip of water the morning of your procedure.  9)  Other Instructions: Call [phone] to cancel the morning of your procedure due         to illness or emergency.  10) We will call within 72 hours to assess how you are feeling.

## 2018-03-21 DIAGNOSIS — F329 Major depressive disorder, single episode, unspecified: Secondary | ICD-10-CM | POA: Diagnosis not present

## 2018-03-21 DIAGNOSIS — E785 Hyperlipidemia, unspecified: Secondary | ICD-10-CM | POA: Diagnosis not present

## 2018-03-21 DIAGNOSIS — I10 Essential (primary) hypertension: Secondary | ICD-10-CM | POA: Diagnosis not present

## 2018-03-21 LAB — CBC AND DIFFERENTIAL
HCT: 43 (ref 36–46)
HEMOGLOBIN: 14 (ref 12.0–16.0)
PLATELETS: 194 (ref 150–399)
WBC: 6.3

## 2018-03-21 LAB — LIPID PANEL
Cholesterol: 222 — AB (ref 0–200)
HDL: 63 (ref 35–70)
LDL Cholesterol: 143
Triglycerides: 83 (ref 40–160)

## 2018-03-21 LAB — HEPATIC FUNCTION PANEL
ALT: 12 (ref 7–35)
AST: 19 (ref 13–35)
Alkaline Phosphatase: 55 (ref 25–125)
Bilirubin, Total: 0.6

## 2018-04-01 ENCOUNTER — Telehealth: Payer: Self-pay

## 2018-04-04 ENCOUNTER — Encounter: Payer: Self-pay | Admitting: Anesthesiology

## 2018-04-04 ENCOUNTER — Encounter
Admission: RE | Admit: 2018-04-04 | Discharge: 2018-04-04 | Disposition: A | Discharge status: Home / Self Care | Payer: PPO | Source: Ambulatory Visit | Attending: Psychiatry | Admitting: Psychiatry

## 2018-04-04 ENCOUNTER — Other Ambulatory Visit: Payer: Self-pay | Admitting: Psychiatry

## 2018-04-04 DIAGNOSIS — Z888 Allergy status to other drugs, medicaments and biological substances status: Secondary | ICD-10-CM | POA: Diagnosis not present

## 2018-04-04 DIAGNOSIS — I10 Essential (primary) hypertension: Secondary | ICD-10-CM | POA: Insufficient documentation

## 2018-04-04 DIAGNOSIS — Z823 Family history of stroke: Secondary | ICD-10-CM | POA: Diagnosis not present

## 2018-04-04 DIAGNOSIS — E782 Mixed hyperlipidemia: Secondary | ICD-10-CM | POA: Diagnosis not present

## 2018-04-04 DIAGNOSIS — Z8489 Family history of other specified conditions: Secondary | ICD-10-CM | POA: Insufficient documentation

## 2018-04-04 DIAGNOSIS — F332 Major depressive disorder, recurrent severe without psychotic features: Secondary | ICD-10-CM

## 2018-04-04 DIAGNOSIS — Z9889 Other specified postprocedural states: Secondary | ICD-10-CM | POA: Insufficient documentation

## 2018-04-04 DIAGNOSIS — Z885 Allergy status to narcotic agent status: Secondary | ICD-10-CM | POA: Diagnosis not present

## 2018-04-04 DIAGNOSIS — Z9049 Acquired absence of other specified parts of digestive tract: Secondary | ICD-10-CM | POA: Diagnosis not present

## 2018-04-04 DIAGNOSIS — F339 Major depressive disorder, recurrent, unspecified: Secondary | ICD-10-CM | POA: Insufficient documentation

## 2018-04-04 DIAGNOSIS — I2510 Atherosclerotic heart disease of native coronary artery without angina pectoris: Secondary | ICD-10-CM | POA: Diagnosis not present

## 2018-04-04 MED ORDER — METHOHEXITAL SODIUM 100 MG/10ML IV SOSY
PREFILLED_SYRINGE | INTRAVENOUS | Status: DC | PRN
  Administered 2018-04-04: 80 mg via INTRAVENOUS

## 2018-04-04 MED ORDER — SODIUM CHLORIDE 0.9 % IV SOLN
INTRAVENOUS | Status: DC | PRN
  Administered 2018-04-04: 09:00:00 via INTRAVENOUS

## 2018-04-04 MED ORDER — SUCCINYLCHOLINE CHLORIDE 200 MG/10ML IV SOSY
PREFILLED_SYRINGE | INTRAVENOUS | Status: DC | PRN
  Administered 2018-04-04: 90 mg via INTRAVENOUS

## 2018-04-04 MED ORDER — KETOROLAC TROMETHAMINE 30 MG/ML IJ SOLN
30.0000 mg | Freq: Once | INTRAMUSCULAR | Status: AC
  Administered 2018-04-04: 30 mg via INTRAVENOUS

## 2018-04-04 MED ORDER — SUCCINYLCHOLINE CHLORIDE 20 MG/ML IJ SOLN
INTRAMUSCULAR | Status: AC
  Filled 2018-04-04: qty 1

## 2018-04-04 MED ORDER — FENTANYL CITRATE (PF) 100 MCG/2ML IJ SOLN: 25.0000 ug | INTRAMUSCULAR | Status: DC | PRN

## 2018-04-04 MED ORDER — SODIUM CHLORIDE 0.9 % IV SOLN
500.0000 mL | Freq: Once | INTRAVENOUS | Status: AC
  Administered 2018-04-04: 500 mL via INTRAVENOUS

## 2018-04-04 MED ORDER — METHOHEXITAL SODIUM 0.5 G IJ SOLR
INTRAMUSCULAR | Status: AC
  Filled 2018-04-04: qty 500

## 2018-04-04 MED ORDER — KETOROLAC TROMETHAMINE 30 MG/ML IJ SOLN
INTRAMUSCULAR | Status: AC
  Filled 2018-04-04: qty 1

## 2018-04-04 NOTE — Anesthesia Procedure Notes (Signed)
Date/Time: 04/04/2018 10:45 AM Performed by: Dionne Bucy, CRNA Pre-anesthesia Checklist: Patient identified, Emergency Drugs available, Suction available and Patient being monitored Patient Re-evaluated:Patient Re-evaluated prior to induction Oxygen Delivery Method: Circle system utilized Preoxygenation: Pre-oxygenation with 100% oxygen Induction Type: IV induction Ventilation: Mask ventilation without difficulty and Mask ventilation throughout procedure Airway Equipment and Method: Bite block Placement Confirmation: positive ETCO2 Dental Injury: Teeth and Oropharynx as per pre-operative assessment

## 2018-04-04 NOTE — Anesthesia Preprocedure Evaluation (Signed)
Anesthesia Evaluation  Patient identified by MRN, date of birth, ID band Patient awake    Reviewed: Allergy & Precautions, H&P , NPO status , Patient's Chart, lab work & pertinent test results, reviewed documented beta blocker date and time   Airway Mallampati: II   Neck ROM: full    Dental  (+) Poor Dentition   Pulmonary neg pulmonary ROS,    Pulmonary exam normal        Cardiovascular Exercise Tolerance: Good hypertension, + CAD  negative cardio ROS Normal cardiovascular exam+ dysrhythmias + pacemaker  Rhythm:regular Rate:Normal     Neuro/Psych PSYCHIATRIC DISORDERS Anxiety Depression negative neurological ROS  negative psych ROS   GI/Hepatic negative GI ROS, Neg liver ROS,   Endo/Other  negative endocrine ROS  Renal/GU Renal diseasenegative Renal ROS  negative genitourinary   Musculoskeletal   Abdominal   Peds  Hematology negative hematology ROS (+) anemia ,   Anesthesia Other Findings Past Medical History: No date: Anxiety Non-obstructive: CAD (coronary artery disease)     Comment:  a. 2004 Cath: nonobs dzs. No date: DDD (degenerative disc disease)     Comment:  a. with chronic right sided back pain - improves after               seeing chiropractor. No date: Depression     Comment:  a. h/o ECT No date: Diverticulitis No date: HTN (hypertension) No date: Hyperlipidemia, mixed 2008: Melanoma of skin ([place])     Comment:  resected from Left arm  No date: Memory problem     Comment:  "states memory issues" No date: Nephrolithiasis No date: Pancreatic cyst     Comment:  a. [id number] Endoscopic U/S: nl UGI tract, 1-12mm pancreatic               cysts, no masses/nodules. No date: Premature ventricular contraction     Comment:  a. managed with propafenone. No date: Presence of permanent cardiac pacemaker No date: Symptomatic bradycardia     Comment:  a. s/p MDT PPM in 08/2005;  b. 02/2010 Gen change->MDT          Adapta DC PPM, ser # [id number] H. Past Surgical History: 1952: APPENDECTOMY No date: CATARACT EXTRACTION, BILATERAL 1983: CHOLECYSTECTOMY 06/22/2013: EUS; N/A     Comment:  Procedure: UPPER ENDOSCOPIC ULTRASOUND (EUS) LINEAR;                Surgeon: Milus Banister, MD;  Location: [facility];                Service: Endoscopy;  Laterality: N/A; No date: EYE SURGERY; Bilateral     Comment:  Cataract Extraction with IOL No date: PACEMAKER INSERTION; Left     Comment:  Medtronic No date: TUMOR EXCISION     Comment:  And nemamgeomas   Reproductive/Obstetrics negative OB ROS                             Anesthesia Physical Anesthesia Plan  ASA: III  Anesthesia Plan: General   Post-op Pain Management:    Induction:   PONV Risk Score and Plan:   Airway Management Planned:   Additional Equipment:   Intra-op Plan:   Post-operative Plan:   Informed Consent: I have reviewed the patients History and Physical, chart, labs and discussed the procedure including the risks, benefits and alternatives for the proposed anesthesia with the patient or authorized representative who has indicated his/her understanding and acceptance.  Dental Advisory Given  Plan Discussed with: CRNA  Anesthesia Plan Comments:         Anesthesia Quick Evaluation

## 2018-04-04 NOTE — Transfer of Care (Signed)
Immediate Anesthesia Transfer of Care Note  Patient: Barbara Marks  Procedure(s) Performed: ECT TX  Patient Location: PACU  Anesthesia Type:General  Level of Consciousness: sedated  Airway & Oxygen Therapy: Patient Spontanous Breathing and Patient connected to face mask oxygen  Post-op Assessment: Report given to RN and Post -op Vital signs reviewed and stable  Post vital signs: Reviewed and stable  Last Vitals:  Vitals Value Taken Time  BP 143/89 04/04/2018 10:57 AM  Temp    Pulse 73 04/04/2018 10:58 AM  Resp 23 04/04/2018 10:58 AM  SpO2 100 % 04/04/2018 10:58 AM  Vitals shown include unvalidated device data.  Last Pain:  Vitals:   04/04/18 0836  TempSrc: Oral         Complications: No apparent anesthesia complications

## 2018-04-04 NOTE — Anesthesia Post-op Follow-up Note (Signed)
Anesthesia QCDR form completed.        

## 2018-04-04 NOTE — Procedures (Signed)
ECT SERVICES Physician's Interval Evaluation & Treatment Note  Patient Identification: Barbara Marks MRN:  [id number] Date of Evaluation:  04/04/2018 TX #: 7  MADRS:   MMSE:   P.E. Findings:  No change physical exam  Psychiatric Interval Note:  Still feeling a little down.  Got better for a short period of time and then slipped back.  Subjective:  Patient is a 84 y.o. female seen for evaluation for Electroconvulsive Therapy. No suicidal thought no psychosis.  Still feels negative not sleeping well  Treatment Summary:   []   Right Unilateral             [x]  Bilateral   % Energy : 1.0 ms 75%   Impedance: 1930 ohms  Seizure Energy Index: 3877 V squared  Postictal Suppression Index: 67%  Seizure Concordance Index: 93%  Medications  Pre Shock: Toradol 30 mg Brevital 80 mg succinylcholine 90 mg  Post Shock:    Seizure Duration: 49 seconds EMG 84 seconds EEG   Comments: Follow-up 2 weeks  Lungs:  [x]   Clear to auscultation               []  Other:   Heart:    [x]   Regular rhythm             []  irregular rhythm    [x]   Previous H&P reviewed, patient examined and there are NO CHANGES                 []   Previous H&P reviewed, patient examined and there are changes noted.   [name], MD 7/1/201910:36 AM

## 2018-04-04 NOTE — Discharge Instructions (Signed)
1)  The drugs that you have been given will stay in your system until tomorrow so for the       next 24 hours you should not:  A. Drive an automobile  B. Make any legal decisions  C. Drink any alcoholic beverages  2)  You may resume your regular meals upon return home.  3)  A responsible adult must take you home.  Someone should stay with you for a few          hours, then be available by phone for the remainder of the treatment day.  4)  You May experience any of the following symptoms:  Headache, Nausea and a dry mouth (due to the medications you were given),  temporary memory loss and some confusion, or sore muscles (a warm bath  should help this).  If you you experience any of these symptoms let us know on                your return visit.  5)  Report any of the following: any acute discomfort, severe headache, or temperature        greater than 100.5 F.   Also report any unusual redness, swelling, drainage, or pain         at your IV site.    You may report Symptoms to:  [place] [facility]          Phone: [phone], ECT Department           or Dr. Prescott Gum office [phone]  6)  Your next ECT Treatment will be when you call for an appointment  We will call 2 days prior to your scheduled appointment for arrival times.  7)  Nothing to eat or drink after midnight the night before your procedure.  8)  Take     With a sip of water the morning of your procedure.  9)  Other Instructions: Call [phone] to cancel the morning of your procedure due         to illness or emergency.  10) We will call within 72 hours to assess how you are feeling.

## 2018-04-04 NOTE — H&P (Signed)
Barbara Marks is an 84 y.o. female.   Chief Complaint: Patient continues to complain of low mood and low motivation.  Not sleeping well not eating well. HPI: History of recurrent depression that responds to ECT  Past Medical History:  Diagnosis Date  . Anxiety   . CAD (coronary artery disease) Non-obstructive   a. 2004 Cath: nonobs dzs.  . DDD (degenerative disc disease)    a. with chronic right sided back pain - improves after seeing chiropractor.  . Depression    a. h/o ECT  . Diverticulitis   . HTN (hypertension)   . Hyperlipidemia, mixed   . Melanoma of skin ([place]) 2008   resected from Left arm   . Memory problem    "states memory issues"  . Nephrolithiasis   . Pancreatic cyst    a. [id number] Endoscopic U/S: nl UGI tract, 1-37mm pancreatic cysts, no masses/nodules.  . Premature ventricular contraction    a. managed with propafenone.  . Presence of permanent cardiac pacemaker   . Symptomatic bradycardia    a. s/p MDT PPM in 08/2005;  b. 02/2010 Gen change->MDT Adapta DC PPM, ser # [id number] H.    Past Surgical History:  Procedure Laterality Date  . APPENDECTOMY  1952  . CATARACT EXTRACTION, BILATERAL    . CHOLECYSTECTOMY  1983  . EUS N/A 06/22/2013   Procedure: UPPER ENDOSCOPIC ULTRASOUND (EUS) LINEAR;  Surgeon: Milus Banister, MD;  Location: [facility];  Service: Endoscopy;  Laterality: N/A;  . EYE SURGERY Bilateral    Cataract Extraction with IOL  . PACEMAKER INSERTION Left    [employer]tronic  . TUMOR EXCISION     And nemamgeomas    Family History  Problem Relation Age of Onset  . Other Mother        died @ 8 - old age.  . Stroke Father        cva after cea in his 14's, died @ 67.   Social History:  reports that she has never smoked. She has never used smokeless tobacco. She reports that she does not drink alcohol or use drugs.  Allergies:  Allergies  Allergen Reactions  . Codeine     unknown  . Simvastatin Other (See Comments)    "leg cramps"     (Not in a  hospital admission)  No results found for this or any previous visit (from the past 48 hour(s)). No results found.  Review of Systems  Constitutional: Positive for weight loss.  HENT: Negative.   Eyes: Negative.   Respiratory: Negative.   Cardiovascular: Negative.   Gastrointestinal: Negative.   Musculoskeletal: Negative.   Skin: Negative.   Neurological: Negative.   Psychiatric/Behavioral: Positive for depression. Negative for hallucinations, substance abuse and suicidal ideas. The patient has insomnia. The patient is not nervous/anxious.     Blood pressure (!) 142/70, pulse 68, temperature 97.8 F (36.6 C), temperature source Oral, SpO2 96 %. Physical Exam  Nursing note and vitals reviewed. Constitutional: She appears well-developed and well-nourished.  HENT:  Head: Normocephalic and atraumatic.  Eyes: Pupils are equal, round, and reactive to light. Conjunctivae are normal.  Neck: Normal range of motion.  Cardiovascular: Normal heart sounds.  Respiratory: Effort normal.  GI: Soft.  Musculoskeletal: Normal range of motion.  Neurological: She is alert.  Skin: Skin is warm and dry.  Psychiatric: Judgment and thought content normal. Her affect is blunt. Her speech is delayed. She is slowed. Cognition and memory are normal.     Assessment/Plan She did  well after her last treatment but then declined over the last week.  I recommend that we do treatment today and then see her back in 2 weeks for another maintenance to which she agrees.  [name], MD 04/04/2018, 10:34 AM

## 2018-04-05 NOTE — Anesthesia Postprocedure Evaluation (Signed)
Anesthesia Post Note  Patient: Barbara Marks  Procedure(s) Performed: ECT TX  Patient location during evaluation: PACU Anesthesia Type: General Level of consciousness: awake and alert Pain management: pain level controlled Vital Signs Assessment: post-procedure vital signs reviewed and stable Respiratory status: spontaneous breathing, nonlabored ventilation, respiratory function stable and patient connected to nasal cannula oxygen Cardiovascular status: blood pressure returned to baseline and stable Postop Assessment: no apparent nausea or vomiting Anesthetic complications: no     Last Vitals:  Vitals:   04/04/18 1118 04/04/18 1127  BP: (!) 145/72 (!) 144/44  Pulse: 81 80  Resp: (!) 29 16  Temp:  (!) 36.3 C  SpO2: 96%     Last Pain:  Vitals:   04/04/18 1127  TempSrc: Oral  PainSc: 0-No pain                 Molli Barrows

## 2018-04-13 ENCOUNTER — Telehealth: Payer: Self-pay

## 2018-04-15 ENCOUNTER — Telehealth: Payer: Self-pay | Admitting: *Deleted

## 2018-04-18 DIAGNOSIS — E876 Hypokalemia: Secondary | ICD-10-CM | POA: Diagnosis not present

## 2018-04-18 LAB — BASIC METABOLIC PANEL
BUN: 18 (ref 4–21)
Creatinine: 0.8 (ref 0.5–1.1)
Glucose: 106
Potassium: 3.9 (ref 3.4–5.3)
Sodium: 140 (ref 137–147)

## 2018-05-11 DIAGNOSIS — G8929 Other chronic pain: Secondary | ICD-10-CM | POA: Diagnosis not present

## 2018-05-11 DIAGNOSIS — R1011 Right upper quadrant pain: Secondary | ICD-10-CM | POA: Diagnosis not present

## 2018-05-11 DIAGNOSIS — R1031 Right lower quadrant pain: Secondary | ICD-10-CM | POA: Diagnosis not present

## 2018-05-30 ENCOUNTER — Telehealth: Payer: Self-pay | Admitting: *Deleted

## 2018-06-01 ENCOUNTER — Encounter
Admission: RE | Admit: 2018-06-01 | Discharge: 2018-06-01 | Disposition: A | Discharge status: Home / Self Care | Payer: PPO | Source: Ambulatory Visit | Attending: Psychiatry | Admitting: Psychiatry

## 2018-06-01 ENCOUNTER — Other Ambulatory Visit: Payer: Self-pay | Admitting: Psychiatry

## 2018-06-01 ENCOUNTER — Encounter: Payer: Self-pay | Admitting: Anesthesiology

## 2018-06-01 DIAGNOSIS — Z885 Allergy status to narcotic agent status: Secondary | ICD-10-CM | POA: Insufficient documentation

## 2018-06-01 DIAGNOSIS — Z888 Allergy status to other drugs, medicaments and biological substances status: Secondary | ICD-10-CM | POA: Diagnosis not present

## 2018-06-01 DIAGNOSIS — I10 Essential (primary) hypertension: Secondary | ICD-10-CM | POA: Diagnosis not present

## 2018-06-01 DIAGNOSIS — F339 Major depressive disorder, recurrent, unspecified: Secondary | ICD-10-CM | POA: Diagnosis not present

## 2018-06-01 DIAGNOSIS — Z8489 Family history of other specified conditions: Secondary | ICD-10-CM | POA: Diagnosis not present

## 2018-06-01 DIAGNOSIS — F332 Major depressive disorder, recurrent severe without psychotic features: Secondary | ICD-10-CM

## 2018-06-01 DIAGNOSIS — E782 Mixed hyperlipidemia: Secondary | ICD-10-CM | POA: Diagnosis not present

## 2018-06-01 DIAGNOSIS — Z9889 Other specified postprocedural states: Secondary | ICD-10-CM | POA: Diagnosis not present

## 2018-06-01 DIAGNOSIS — I2510 Atherosclerotic heart disease of native coronary artery without angina pectoris: Secondary | ICD-10-CM | POA: Diagnosis not present

## 2018-06-01 DIAGNOSIS — Z823 Family history of stroke: Secondary | ICD-10-CM | POA: Diagnosis not present

## 2018-06-01 DIAGNOSIS — Z9049 Acquired absence of other specified parts of digestive tract: Secondary | ICD-10-CM | POA: Insufficient documentation

## 2018-06-01 MED ORDER — KETOROLAC TROMETHAMINE 30 MG/ML IJ SOLN
30.0000 mg | Freq: Once | INTRAMUSCULAR | Status: AC
  Administered 2018-06-01: 30 mg via INTRAVENOUS

## 2018-06-01 MED ORDER — SUCCINYLCHOLINE CHLORIDE 200 MG/10ML IV SOSY
PREFILLED_SYRINGE | INTRAVENOUS | Status: DC | PRN
  Administered 2018-06-01: 80 mg via INTRAVENOUS

## 2018-06-01 MED ORDER — KETOROLAC TROMETHAMINE 30 MG/ML IJ SOLN
INTRAMUSCULAR | Status: AC
  Filled 2018-06-01: qty 1

## 2018-06-01 MED ORDER — FENTANYL CITRATE (PF) 100 MCG/2ML IJ SOLN: 25.0000 ug | INTRAMUSCULAR | Status: DC | PRN

## 2018-06-01 MED ORDER — METHOHEXITAL SODIUM 100 MG/10ML IV SOSY
PREFILLED_SYRINGE | INTRAVENOUS | Status: DC | PRN
  Administered 2018-06-01: 80 mg via INTRAVENOUS

## 2018-06-01 MED ORDER — SODIUM CHLORIDE 0.9 % IV SOLN
500.0000 mL | Freq: Once | INTRAVENOUS | Status: AC
  Administered 2018-06-01: 500 mL via INTRAVENOUS

## 2018-06-01 MED ORDER — SODIUM CHLORIDE 0.9 % IV SOLN
INTRAVENOUS | Status: DC | PRN
  Administered 2018-06-01: 09:00:00 via INTRAVENOUS

## 2018-06-01 MED ORDER — METHOHEXITAL SODIUM 0.5 G IJ SOLR
INTRAMUSCULAR | Status: AC
  Filled 2018-06-01: qty 500

## 2018-06-01 NOTE — Discharge Instructions (Signed)
1)  The drugs that you have been given will stay in your system until tomorrow so for the       next 24 hours you should not:  A. Drive an automobile  B. Make any legal decisions  C. Drink any alcoholic beverages  2)  You may resume your regular meals upon return home.  3)  A responsible adult must take you home.  Someone should stay with you for a few          hours, then be available by phone for the remainder of the treatment day.  4)  You May experience any of the following symptoms:  Headache, Nausea and a dry mouth (due to the medications you were given),  temporary memory loss and some confusion, or sore muscles (a warm bath  should help this).  If you you experience any of these symptoms let us know on                your return visit.  5)  Report any of the following: any acute discomfort, severe headache, or temperature        greater than 100.5 F.   Also report any unusual redness, swelling, drainage, or pain         at your IV site.    You may report Symptoms to:  [place] [facility]          Phone: [phone], ECT Department           or Dr. Prescott Gum office [phone]  6)  Your next ECT Treatment is Day Friday  Date June 03, 2018 at 815am  We will call 2 days prior to your scheduled appointment for arrival times.  7)  Nothing to eat or drink after midnight the night before your procedure.  8)  Take .     With a sip of water the morning of your procedure.  9)  Other Instructions: Call [phone] to cancel the morning of your procedure due         to illness or emergency.  10) We will call within 72 hours to assess how you are feeling.

## 2018-06-01 NOTE — H&P (Signed)
Barbara Marks is an 84 y.o. female.   Chief Complaint: Patient with chronic depression recurrent has a return of depression and low energy low motivation HPI: History of recurrent depression with good response to ECT along with other management  Past Medical History:  Diagnosis Date  . Anxiety   . CAD (coronary artery disease) Non-obstructive   a. 2004 Cath: nonobs dzs.  . DDD (degenerative disc disease)    a. with chronic right sided back pain - improves after seeing chiropractor.  . Depression    a. h/o ECT  . Diverticulitis   . HTN (hypertension)   . Hyperlipidemia, mixed   . Melanoma of skin ([place]) 2008   resected from Left arm   . Memory problem    "states memory issues"  . Nephrolithiasis   . Pancreatic cyst    a. [id number] Endoscopic U/S: nl UGI tract, 1-52mm pancreatic cysts, no masses/nodules.  . Premature ventricular contraction    a. managed with propafenone.  . Presence of permanent cardiac pacemaker   . Symptomatic bradycardia    a. s/p MDT PPM in 08/2005;  b. 02/2010 Gen change->MDT Adapta DC PPM, ser # [id number] H.    Past Surgical History:  Procedure Laterality Date  . APPENDECTOMY  1952  . CATARACT EXTRACTION, BILATERAL    . CHOLECYSTECTOMY  1983  . EUS N/A 06/22/2013   Procedure: UPPER ENDOSCOPIC ULTRASOUND (EUS) LINEAR;  Surgeon: Milus Banister, MD;  Location: [facility];  Service: Endoscopy;  Laterality: N/A;  . EYE SURGERY Bilateral    Cataract Extraction with IOL  . PACEMAKER INSERTION Left    [employer]tr[employer]  . TUMOR EXCISION     And nemamgeomas    Family History  Problem Relation Age of Onset  . Other Mother        died @ 27 - old age.  . Stroke Father        cva after cea in his 26's, died @ 64.   Social History:  reports that she has never smoked. She has never used smokeless tobacco. She reports that she does not drink alcohol or use drugs.  Allergies:  Allergies  Allergen Reactions  . Codeine     unknown  . Simvastatin Other (See Comments)     "leg cramps"     (Not in a hospital admission)  No results found for this or any previous visit (from the past 48 hour(s)). No results found.  Review of Systems  Constitutional: Negative.   HENT: Negative.   Eyes: Negative.   Respiratory: Negative.   Cardiovascular: Negative.   Gastrointestinal: Negative.   Musculoskeletal: Negative.   Skin: Negative.   Neurological: Negative.   Psychiatric/Behavioral: Positive for depression. Negative for hallucinations, memory loss, substance abuse and suicidal ideas. The patient is not nervous/anxious and does not have insomnia.     Blood pressure (!) 152/82, pulse 72, temperature 98 F (36.7 C), temperature source Oral, height 5\' 6"  (1.676 m), weight 71.2 kg, SpO2 100 %. Physical Exam  Nursing note and vitals reviewed. Constitutional: She appears well-developed and well-nourished.  HENT:  Head: Normocephalic and atraumatic.  Eyes: Pupils are equal, round, and reactive to light. Conjunctivae are normal.  Neck: Normal range of motion.  Cardiovascular: Regular rhythm and normal heart sounds.  Respiratory: Effort normal. No respiratory distress.  GI: Soft.  Musculoskeletal: Normal range of motion.  Neurological: She is alert.  Skin: Skin is warm and dry.  Psychiatric: Judgment normal. Her speech is delayed. She is slowed. Cognition  and memory are normal. She exhibits a depressed mood. She expresses no suicidal ideation.     Assessment/Plan Treatment today and after discussion she is agreeable to coming back tomorrow as I think to in a row is more likely to get her to feeling better.  [name], MD 06/01/2018, 10:12 AM

## 2018-06-01 NOTE — Procedures (Signed)
ECT SERVICES Physician's Interval Evaluation & Treatment Note  Patient Identification: LENNYX VERDELL MRN:  [id number] Date of Evaluation:  06/01/2018 TX #: 8  MADRS: 29  MMSE: 30  P.E. Findings:  No change to physical exam.  Psychiatric Interval Note:  Flat down but lucid and clear thinking not suicidal  Subjective:  Patient is a 84 y.o. female seen for evaluation for Electroconvulsive Therapy. Recurrent depression  Treatment Summary:   []   Right Unilateral             [x]  Bilateral   % Energy : 1.0 ms 75%   Impedance: 2030 ohms  Seizure Energy Index: 6275 V squared  Postictal Suppression Index: 67%  Seizure Concordance Index: 92%  Medications  Pre Shock: Toradol 30 mg Brevital 80 mg succinylcholine 90 mg  Post Shock:    Seizure Duration: 23 seconds by EMG 46 seconds by EEG   Comments: Follow-up on Friday  Lungs:  [x]   Clear to auscultation               []  Other:   Heart:    [x]   Regular rhythm             []  irregular rhythm    [x]   Previous H&P reviewed, patient examined and there are NO CHANGES                 []   Previous H&P reviewed, patient examined and there are changes noted.   [name], MD 8/28/201910:14 AM

## 2018-06-01 NOTE — Transfer of Care (Signed)
Immediate Anesthesia Transfer of Care Note  Patient: Barbara Marks  Procedure(s) Performed: ECT TX  Patient Location: PACU  Anesthesia Type:General  Level of Consciousness: sedated  Airway & Oxygen Therapy: Patient Spontanous Breathing and Patient connected to face mask oxygen  Post-op Assessment: Report given to RN and Post -op Vital signs reviewed and stable  Post vital signs: Reviewed and stable  Last Vitals:  Vitals Value Taken Time  BP 159/67 06/01/2018 10:31 AM  Temp    Pulse 75 06/01/2018 10:31 AM  Resp 25 06/01/2018 10:31 AM  SpO2 100 % 06/01/2018 10:31 AM  Vitals shown include unvalidated device data.  Last Pain:  Vitals:   06/01/18 1028  TempSrc:   PainSc: (P) 0-No pain         Complications: No apparent anesthesia complications

## 2018-06-01 NOTE — Anesthesia Post-op Follow-up Note (Signed)
Anesthesia QCDR form completed.        

## 2018-06-01 NOTE — Anesthesia Preprocedure Evaluation (Signed)
Anesthesia Evaluation  Patient identified by MRN, date of birth, ID band Patient awake    Reviewed: Allergy & Precautions, H&P , NPO status , Patient's Chart, lab work & pertinent test results, reviewed documented beta blocker date and time   Airway Mallampati: II   Neck ROM: full    Dental  (+) Poor Dentition   Pulmonary neg pulmonary ROS,    Pulmonary exam normal        Cardiovascular Exercise Tolerance: Poor hypertension, On Medications + CAD  negative cardio ROS Normal cardiovascular exam+ dysrhythmias + pacemaker  Rhythm:regular Rate:Normal     Neuro/Psych PSYCHIATRIC DISORDERS Anxiety Depression negative neurological ROS  negative psych ROS   GI/Hepatic negative GI ROS, Neg liver ROS,   Endo/Other  negative endocrine ROS  Renal/GU Renal diseasenegative Renal ROS  negative genitourinary   Musculoskeletal   Abdominal   Peds  Hematology negative hematology ROS (+) anemia ,   Anesthesia Other Findings Past Medical History: No date: Anxiety Non-obstructive: CAD (coronary artery disease)     Comment:  a. 2004 Cath: nonobs dzs. No date: DDD (degenerative disc disease)     Comment:  a. with chronic right sided back pain - improves after               seeing chiropractor. No date: Depression     Comment:  a. h/o ECT No date: Diverticulitis No date: HTN (hypertension) No date: Hyperlipidemia, mixed 2008: Melanoma of skin ([place])     Comment:  resected from Left arm  No date: Memory problem     Comment:  "states memory issues" No date: Nephrolithiasis No date: Pancreatic cyst     Comment:  a. [id number] Endoscopic U/S: nl UGI tract, 1-58mm pancreatic               cysts, no masses/nodules. No date: Premature ventricular contraction     Comment:  a. managed with propafenone. No date: Presence of permanent cardiac pacemaker No date: Symptomatic bradycardia     Comment:  a. s/p MDT PPM in 08/2005;  b. 02/2010 Gen  change->MDT               Adapta DC PPM, ser # [id number] H. Past Surgical History: 1952: APPENDECTOMY No date: CATARACT EXTRACTION, BILATERAL 1983: CHOLECYSTECTOMY 06/22/2013: EUS; N/A     Comment:  Procedure: UPPER ENDOSCOPIC ULTRASOUND (EUS) LINEAR;                Surgeon: Milus Banister, MD;  Location: [facility];                Service: Endoscopy;  Laterality: N/A; No date: EYE SURGERY; Bilateral     Comment:  Cataract Extraction with IOL No date: PACEMAKER INSERTION; Left     Comment:  [employer]tronic No date: TUMOR EXCISION     Comment:  And nemamgeomas BMI    Body Mass Index:  25.34 kg/m     Reproductive/Obstetrics negative OB ROS                             Anesthesia Physical Anesthesia Plan  ASA: III  Anesthesia Plan: General   Post-op Pain Management:    Induction:   PONV Risk Score and Plan:   Airway Management Planned:   Additional Equipment:   Intra-op Plan:   Post-operative Plan:   Informed Consent: I have reviewed the patients History and Physical, chart, labs and discussed the procedure including the risks, benefits  and alternatives for the proposed anesthesia with the patient or authorized representative who has indicated his/her understanding and acceptance.   Dental Advisory Given  Plan Discussed with: CRNA  Anesthesia Plan Comments:         Anesthesia Quick Evaluation

## 2018-06-01 NOTE — Anesthesia Procedure Notes (Signed)
Date/Time: 06/01/2018 10:21 AM Performed by: Dionne Bucy, CRNA Pre-anesthesia Checklist: Patient identified, Emergency Drugs available, Suction available and Patient being monitored Patient Re-evaluated:Patient Re-evaluated prior to induction Oxygen Delivery Method: Circle system utilized Preoxygenation: Pre-oxygenation with 100% oxygen Induction Type: IV induction Ventilation: Mask ventilation without difficulty and Mask ventilation throughout procedure Airway Equipment and Method: Bite block Placement Confirmation: positive ETCO2 Dental Injury: Teeth and Oropharynx as per pre-operative assessment

## 2018-06-02 NOTE — Anesthesia Postprocedure Evaluation (Signed)
Anesthesia Post Note  Patient: Barbara Marks  Procedure(s) Performed: ECT TX  Patient location during evaluation: PACU Anesthesia Type: General Level of consciousness: awake and alert Pain management: pain level controlled Vital Signs Assessment: post-procedure vital signs reviewed and stable Respiratory status: spontaneous breathing, nonlabored ventilation, respiratory function stable and patient connected to nasal cannula oxygen Cardiovascular status: blood pressure returned to baseline and stable Postop Assessment: no apparent nausea or vomiting Anesthetic complications: no     Last Vitals:  Vitals:   06/01/18 1101 06/01/18 1104  BP:  (!) 154/93  Pulse: (!) 44 92  Resp: (!) 29 18  Temp:    SpO2: 98%     Last Pain:  Vitals:   06/01/18 1104  TempSrc:   PainSc: 0-No pain                 Molli Barrows

## 2018-06-03 ENCOUNTER — Other Ambulatory Visit: Payer: Self-pay | Admitting: Psychiatry

## 2018-06-03 ENCOUNTER — Encounter (HOSPITAL_COMMUNITY)
Admission: RE | Admit: 2018-06-03 | Discharge: 2018-06-03 | Disposition: A | Discharge status: Home / Self Care | Payer: PPO | Source: Ambulatory Visit | Attending: Psychiatry | Admitting: Psychiatry

## 2018-06-03 ENCOUNTER — Encounter: Payer: Self-pay | Admitting: Certified Registered Nurse Anesthetist

## 2018-06-03 DIAGNOSIS — E782 Mixed hyperlipidemia: Secondary | ICD-10-CM | POA: Diagnosis not present

## 2018-06-03 DIAGNOSIS — F332 Major depressive disorder, recurrent severe without psychotic features: Secondary | ICD-10-CM | POA: Diagnosis present

## 2018-06-03 DIAGNOSIS — I2510 Atherosclerotic heart disease of native coronary artery without angina pectoris: Secondary | ICD-10-CM | POA: Diagnosis not present

## 2018-06-03 DIAGNOSIS — I10 Essential (primary) hypertension: Secondary | ICD-10-CM | POA: Diagnosis not present

## 2018-06-03 DIAGNOSIS — F339 Major depressive disorder, recurrent, unspecified: Secondary | ICD-10-CM | POA: Diagnosis not present

## 2018-06-03 MED ORDER — KETOROLAC TROMETHAMINE 30 MG/ML IJ SOLN
INTRAMUSCULAR | Status: AC
  Administered 2018-06-03: 30 mg via INTRAVENOUS
  Filled 2018-06-03: qty 1

## 2018-06-03 MED ORDER — SODIUM CHLORIDE 0.9 % IV SOLN
INTRAVENOUS | Status: DC
  Administered 2018-06-03: 10:00:00 via INTRAVENOUS

## 2018-06-03 MED ORDER — KETOROLAC TROMETHAMINE 30 MG/ML IJ SOLN
30.0000 mg | Freq: Once | INTRAMUSCULAR | Status: AC
  Administered 2018-06-03: 30 mg via INTRAVENOUS

## 2018-06-03 MED ORDER — SUCCINYLCHOLINE CHLORIDE 20 MG/ML IJ SOLN
INTRAMUSCULAR | Status: DC | PRN
  Administered 2018-06-03: 90 mg via INTRAVENOUS

## 2018-06-03 MED ORDER — FENTANYL CITRATE (PF) 100 MCG/2ML IJ SOLN: 25.0000 ug | INTRAMUSCULAR | Status: DC | PRN

## 2018-06-03 MED ORDER — METHOHEXITAL SODIUM 100 MG/10ML IV SOSY
PREFILLED_SYRINGE | INTRAVENOUS | Status: DC | PRN
  Administered 2018-06-03: 80 mg via INTRAVENOUS

## 2018-06-03 MED ORDER — ONDANSETRON HCL 4 MG/2ML IJ SOLN: 4.0000 mg | Freq: Once | INTRAMUSCULAR | Status: DC | PRN

## 2018-06-03 NOTE — H&P (Signed)
Barbara Marks is an 84 y.o. female.   Chief Complaint: Feeling a little better still some lingering depression and lack of motivation HPI: History of recurrent severe depression that has responded well to maintenance ECT  Past Medical History:  Diagnosis Date  . Anxiety   . CAD (coronary artery disease) Non-obstructive   a. 2004 Cath: nonobs dzs.  . DDD (degenerative disc disease)    a. with chronic right sided back pain - improves after seeing chiropractor.  . Depression    a. h/o ECT  . Diverticulitis   . HTN (hypertension)   . Hyperlipidemia, mixed   . Melanoma of skin ([place]) 2008   resected from Left arm   . Memory problem    "states memory issues"  . Nephrolithiasis   . Pancreatic cyst    a. [id number] Endoscopic U/S: nl UGI tract, 1-3mm pancreatic cysts, no masses/nodules.  . Premature ventricular contraction    a. managed with propafenone.  . Presence of permanent cardiac pacemaker   . Symptomatic bradycardia    a. s/p MDT PPM in 08/2005;  b. 02/2010 Gen change->MDT Adapta DC PPM, ser # [id number] H.    Past Surgical History:  Procedure Laterality Date  . APPENDECTOMY  1952  . CATARACT EXTRACTION, BILATERAL    . CHOLECYSTECTOMY  1983  . EUS N/A 06/22/2013   Procedure: UPPER ENDOSCOPIC ULTRASOUND (EUS) LINEAR;  Surgeon: Milus Banister, MD;  Location: [facility];  Service: Endoscopy;  Laterality: N/A;  . EYE SURGERY Bilateral    Cataract Extraction with IOL  . PACEMAKER INSERTION Left    [employer]  . TUMOR EXCISION     And nemamgeomas    Family History  Problem Relation Age of Onset  . Other Mother        died @ 51 - old age.  . Stroke Father        cva after cea in his 38's, died @ 75.   Social History:  reports that she has never smoked. She has never used smokeless tobacco. She reports that she does not drink alcohol or use drugs.  Allergies:  Allergies  Allergen Reactions  . Codeine     unknown  . Simvastatin Other (See Comments)    "leg cramps"      (Not in a hospital admission)  No results found for this or any previous visit (from the past 48 hour(s)). No results found.  Review of Systems  Constitutional: Negative.   HENT: Negative.   Eyes: Negative.   Respiratory: Negative.   Cardiovascular: Negative.   Gastrointestinal: Negative.   Musculoskeletal: Negative.   Skin: Negative.   Neurological: Negative.   Psychiatric/Behavioral: Positive for depression. Negative for hallucinations, memory loss, substance abuse and suicidal ideas. The patient is not nervous/anxious and does not have insomnia.     Blood pressure (!) 147/59, pulse 73, temperature 97.7 F (36.5 C), temperature source Oral, resp. rate 16, height 5\' 6"  (1.676 m), weight 70.8 kg, SpO2 100 %. Physical Exam  Nursing note and vitals reviewed. Constitutional: She appears well-developed and well-nourished.  HENT:  Head: Normocephalic and atraumatic.  Eyes: Pupils are equal, round, and reactive to light. Conjunctivae are normal.  Neck: Normal range of motion.  Cardiovascular: Normal heart sounds.  Respiratory: Effort normal.  GI: Soft.  Musculoskeletal: Normal range of motion.  Neurological: She is alert.  Skin: Skin is warm and dry.  Psychiatric: She has a normal mood and affect. Her behavior is normal. Judgment and thought content normal.  Assessment/Plan Treatment today which we are hoping will get her back closer to baseline but would like to follow-up in 2 weeks.  [name], MD 06/03/2018, 10:26 AM

## 2018-06-03 NOTE — Anesthesia Procedure Notes (Signed)
Procedure Name: General with mask airway Date/Time: 06/03/2018 10:25 AM Performed by: Johnna Acosta, CRNA Pre-anesthesia Checklist: Patient identified, Emergency Drugs available, Suction available and Timeout performed Patient Re-evaluated:Patient Re-evaluated prior to induction Oxygen Delivery Method: Circle system utilized Preoxygenation: Pre-oxygenation with 100% oxygen Induction Type: IV induction Ventilation: Mask ventilation without difficulty

## 2018-06-03 NOTE — Anesthesia Post-op Follow-up Note (Signed)
Anesthesia QCDR form completed.        

## 2018-06-03 NOTE — Anesthesia Preprocedure Evaluation (Signed)
Anesthesia Evaluation  Patient identified by MRN, date of birth, ID band Patient awake    Reviewed: Allergy & Precautions, H&P , NPO status , Patient's Chart, lab work & pertinent test results, reviewed documented beta blocker date and time   Airway Mallampati: II   Neck ROM: full    Dental  (+) Poor Dentition   Pulmonary neg pulmonary ROS,    Pulmonary exam normal        Cardiovascular Exercise Tolerance: Poor hypertension, On Medications + CAD  negative cardio ROS Normal cardiovascular exam+ dysrhythmias + pacemaker  Rhythm:regular Rate:Normal     Neuro/Psych PSYCHIATRIC DISORDERS Anxiety Depression negative neurological ROS  negative psych ROS   GI/Hepatic negative GI ROS, Neg liver ROS,   Endo/Other  negative endocrine ROS  Renal/GU Renal diseasenegative Renal ROS  negative genitourinary   Musculoskeletal  (+) Arthritis ,   Abdominal   Peds  Hematology negative hematology ROS (+) anemia ,   Anesthesia Other Findings Past Medical History: No date: Anxiety Non-obstructive: CAD (coronary artery disease)     Comment:  a. 2004 Cath: nonobs dzs. No date: DDD (degenerative disc disease)     Comment:  a. with chronic right sided back pain - improves after               seeing chiropractor. No date: Depression     Comment:  a. h/o ECT No date: Diverticulitis No date: HTN (hypertension) No date: Hyperlipidemia, mixed 2008: Melanoma of skin ([place])     Comment:  resected from Left arm  No date: Memory problem     Comment:  "states memory issues" No date: Nephrolithiasis No date: Pancreatic cyst     Comment:  a. [id number] Endoscopic U/S: nl UGI tract, 1-102mm pancreatic               cysts, no masses/nodules. No date: Premature ventricular contraction     Comment:  a. managed with propafenone. No date: Presence of permanent cardiac pacemaker No date: Symptomatic bradycardia     Comment:  a. s/p MDT PPM in 08/2005;   b. 02/2010 Gen change->MDT               Adapta DC PPM, ser # [id number] H. Past Surgical History: 1952: APPENDECTOMY No date: CATARACT EXTRACTION, BILATERAL 1983: CHOLECYSTECTOMY 06/22/2013: EUS; N/A     Comment:  Procedure: UPPER ENDOSCOPIC ULTRASOUND (EUS) LINEAR;                Surgeon: Milus Banister, MD;  Location: [facility];                Service: Endoscopy;  Laterality: N/A; No date: EYE SURGERY; Bilateral     Comment:  Cataract Extraction with IOL No date: PACEMAKER INSERTION; Left     Comment:  [employer] No date: TUMOR EXCISION     Comment:  And nemamgeomas BMI    Body Mass Index:  25.34 kg/m     Reproductive/Obstetrics negative OB ROS                             Anesthesia Physical  Anesthesia Plan  ASA: III  Anesthesia Plan: General   Post-op Pain Management:    Induction:   PONV Risk Score and Plan:   Airway Management Planned:   Additional Equipment:   Intra-op Plan:   Post-operative Plan:   Informed Consent: I have reviewed the patients History and Physical, chart, labs and discussed the  procedure including the risks, benefits and alternatives for the proposed anesthesia with the patient or authorized representative who has indicated his/her understanding and acceptance.   Dental Advisory Given  Plan Discussed with: CRNA  Anesthesia Plan Comments:         Anesthesia Quick Evaluation

## 2018-06-03 NOTE — Transfer of Care (Signed)
Immediate Anesthesia Transfer of Care Note  Patient: Barbara Marks  Procedure(s) Performed: ECT TX  Patient Location: PACU  Anesthesia Type:General  Level of Consciousness: awake and drowsy  Airway & Oxygen Therapy: Patient Spontanous Breathing and Patient connected to face mask oxygen  Post-op Assessment: Report given to RN and Post -op Vital signs reviewed and stable  Post vital signs: Reviewed and stable  Last Vitals:  Vitals Value Taken Time  BP 167/93 06/03/2018 10:43 AM  Temp 37.2 C 06/03/2018 10:43 AM  Pulse 80 06/03/2018 10:43 AM  Resp 17 06/03/2018 10:43 AM  SpO2 100 % 06/03/2018 10:43 AM  Vitals shown include unvalidated device data.  Last Pain:  Vitals:   06/03/18 0834  TempSrc: Oral  PainSc:          Complications: No apparent anesthesia complications

## 2018-06-03 NOTE — Anesthesia Postprocedure Evaluation (Signed)
Anesthesia Post Note  Patient: Barbara Marks  Procedure(s) Performed: ECT TX  Patient location during evaluation: PACU Anesthesia Type: General Level of consciousness: awake and alert and oriented Pain management: pain level controlled Vital Signs Assessment: post-procedure vital signs reviewed and stable Respiratory status: spontaneous breathing Cardiovascular status: blood pressure returned to baseline Anesthetic complications: no     Last Vitals:  Vitals:   06/03/18 1103 06/03/18 1126  BP: (!) 148/85 (!) 148/83  Pulse: 100 82  Resp: 20 20  Temp:  37.1 C  SpO2: 96%     Last Pain:  Vitals:   06/03/18 1126  TempSrc: Oral  PainSc: 0-No pain                 Aryahna Spagna

## 2018-06-03 NOTE — Procedures (Signed)
ECT SERVICES Physician's Interval Evaluation & Treatment Note  Patient Identification: Barbara Marks MRN:  [id number] Date of Evaluation:  06/03/2018 TX #: 9  MADRS:   MMSE:   P.E. Findings:  No change to physical exam  Psychiatric Interval Note:  Continues to feel little bit depressed not yet at baseline.  Better than she was on Wednesday  Subjective:  Patient is a 84 y.o. female seen for evaluation for Electroconvulsive Therapy. See note above.  Better but not yet baseline  Treatment Summary:   []   Right Unilateral             [x]  Bilateral   % Energy : 1.0 ms 75%   Impedance: 1510 ohms  Seizure Energy Index: 4556 V squared  Postictal Suppression Index: 60%  Seizure Concordance Index: 93%  Medications  Pre Shock: Toradol 30 mg Brevital 80 mg succinylcholine 90 mg  Post Shock:    Seizure Duration: 38 seconds by EMG 63 seconds by EEG   Comments: Patient agrees to a plan for a follow-up treatment in 2 weeks  Lungs:  [x]   Clear to auscultation               []  Other:   Heart:    [x]   Regular rhythm             []  irregular rhythm    [x]   Previous H&P reviewed, patient examined and there are NO CHANGES                 []   Previous H&P reviewed, patient examined and there are changes noted.   [name], MD 8/30/201910:27 AM

## 2018-06-03 NOTE — Progress Notes (Deleted)
Translator used is Lacretia Leigh for assessment of care and explanation of services provided.

## 2018-06-03 NOTE — Discharge Instructions (Signed)
1)  The drugs that you have been given will stay in your system until tomorrow so for the       next 24 hours you should not:  A. Drive an automobile  B. Make any legal decisions  C. Drink any alcoholic beverages  2)  You may resume your regular meals upon return home.  3)  A responsible adult must take you home.  Someone should stay with you for a few          hours, then be available by phone for the remainder of the treatment day.  4)  You May experience any of the following symptoms:  Headache, Nausea and a dry mouth (due to the medications you were given),  temporary memory loss and some confusion, or sore muscles (a warm bath  should help this).  If you you experience any of these symptoms let us know on                your return visit.  5)  Report any of the following: any acute discomfort, severe headache, or temperature        greater than 100.5 F.   Also report any unusual redness, swelling, drainage, or pain         at your IV site.    You may report Symptoms to:  [place] [facility]          Phone: [phone], ECT Department           or Dr. Prescott Gum office [phone]  6)  Your next ECT Treatment is Wednesday September 11  We will call 2 days prior to your scheduled appointment for arrival times.  7)  Nothing to eat or drink after midnight the night before your procedure.  8)  Take     With a sip of water the morning of your procedure.  9)  Other Instructions: Call [phone] to cancel the morning of your procedure due         to illness or emergency.  10) We will call within 72 hours to assess how you are feeling.

## 2018-06-13 ENCOUNTER — Telehealth: Payer: Self-pay | Admitting: *Deleted

## 2018-06-22 ENCOUNTER — Telehealth: Payer: Self-pay | Admitting: *Deleted

## 2018-07-18 ENCOUNTER — Ambulatory Visit: Admission: RE | Admit: 2018-07-18 | Payer: PPO | Source: Ambulatory Visit | Admitting: Unknown Physician Specialty

## 2018-07-18 ENCOUNTER — Encounter: Admission: RE | Payer: Self-pay | Source: Ambulatory Visit

## 2018-07-18 SURGERY — EGD (ESOPHAGOGASTRODUODENOSCOPY)
Anesthesia: General

## 2018-07-29 DIAGNOSIS — L249 Irritant contact dermatitis, unspecified cause: Secondary | ICD-10-CM | POA: Diagnosis not present

## 2018-07-29 DIAGNOSIS — D692 Other nonthrombocytopenic purpura: Secondary | ICD-10-CM | POA: Diagnosis not present

## 2018-07-29 DIAGNOSIS — L821 Other seborrheic keratosis: Secondary | ICD-10-CM | POA: Diagnosis not present

## 2018-07-29 DIAGNOSIS — C44622 Squamous cell carcinoma of skin of right upper limb, including shoulder: Secondary | ICD-10-CM | POA: Diagnosis not present

## 2018-07-29 DIAGNOSIS — C44519 Basal cell carcinoma of skin of other part of trunk: Secondary | ICD-10-CM | POA: Diagnosis not present

## 2018-07-29 DIAGNOSIS — D485 Neoplasm of uncertain behavior of skin: Secondary | ICD-10-CM | POA: Diagnosis not present

## 2018-07-29 DIAGNOSIS — L812 Freckles: Secondary | ICD-10-CM | POA: Diagnosis not present

## 2018-09-14 ENCOUNTER — Telehealth: Payer: Self-pay | Admitting: *Deleted

## 2018-09-14 NOTE — Addendum Note (Signed)
Encounter addended by: Domingo Dimes, RN on: 09/14/2018 2:58 PM  Actions taken: Order list changed

## 2018-09-15 ENCOUNTER — Other Ambulatory Visit: Payer: Self-pay | Admitting: Psychiatry

## 2018-09-16 ENCOUNTER — Encounter
Admission: RE | Admit: 2018-09-16 | Discharge: 2018-09-16 | Disposition: A | Discharge status: Home / Self Care | Payer: PPO | Source: Ambulatory Visit | Attending: Psychiatry | Admitting: Psychiatry

## 2018-09-16 ENCOUNTER — Encounter: Payer: Self-pay | Admitting: Anesthesiology

## 2018-09-16 DIAGNOSIS — Z950 Presence of cardiac pacemaker: Secondary | ICD-10-CM | POA: Diagnosis not present

## 2018-09-16 DIAGNOSIS — F332 Major depressive disorder, recurrent severe without psychotic features: Secondary | ICD-10-CM | POA: Diagnosis not present

## 2018-09-16 DIAGNOSIS — M549 Dorsalgia, unspecified: Secondary | ICD-10-CM | POA: Insufficient documentation

## 2018-09-16 DIAGNOSIS — R413 Other amnesia: Secondary | ICD-10-CM | POA: Insufficient documentation

## 2018-09-16 DIAGNOSIS — I10 Essential (primary) hypertension: Secondary | ICD-10-CM | POA: Insufficient documentation

## 2018-09-16 DIAGNOSIS — Z85820 Personal history of malignant melanoma of skin: Secondary | ICD-10-CM | POA: Insufficient documentation

## 2018-09-16 DIAGNOSIS — E782 Mixed hyperlipidemia: Secondary | ICD-10-CM | POA: Diagnosis not present

## 2018-09-16 DIAGNOSIS — F419 Anxiety disorder, unspecified: Secondary | ICD-10-CM | POA: Diagnosis not present

## 2018-09-16 DIAGNOSIS — I2510 Atherosclerotic heart disease of native coronary artery without angina pectoris: Secondary | ICD-10-CM | POA: Insufficient documentation

## 2018-09-16 DIAGNOSIS — F339 Major depressive disorder, recurrent, unspecified: Secondary | ICD-10-CM | POA: Diagnosis not present

## 2018-09-16 DIAGNOSIS — Z885 Allergy status to narcotic agent status: Secondary | ICD-10-CM | POA: Insufficient documentation

## 2018-09-16 DIAGNOSIS — G8929 Other chronic pain: Secondary | ICD-10-CM | POA: Insufficient documentation

## 2018-09-16 MED ORDER — KETOROLAC TROMETHAMINE 30 MG/ML IJ SOLN
30.0000 mg | Freq: Once | INTRAMUSCULAR | Status: AC
  Administered 2018-09-16: 30 mg via INTRAVENOUS

## 2018-09-16 MED ORDER — SUCCINYLCHOLINE CHLORIDE 20 MG/ML IJ SOLN
INTRAMUSCULAR | Status: DC | PRN
  Administered 2018-09-16: 90 mg via INTRAVENOUS

## 2018-09-16 MED ORDER — METHOHEXITAL SODIUM 100 MG/10ML IV SOSY
PREFILLED_SYRINGE | INTRAVENOUS | Status: DC | PRN
  Administered 2018-09-16: 80 mg via INTRAVENOUS

## 2018-09-16 MED ORDER — KETOROLAC TROMETHAMINE 30 MG/ML IJ SOLN
INTRAMUSCULAR | Status: AC
  Administered 2018-09-16: 30 mg via INTRAVENOUS
  Filled 2018-09-16: qty 1

## 2018-09-16 MED ORDER — SUCCINYLCHOLINE CHLORIDE 20 MG/ML IJ SOLN
INTRAMUSCULAR | Status: AC
  Filled 2018-09-16: qty 1

## 2018-09-16 MED ORDER — SODIUM CHLORIDE 0.9 % IV SOLN
500.0000 mL | Freq: Once | INTRAVENOUS | Status: AC
  Administered 2018-09-16: 500 mL via INTRAVENOUS

## 2018-09-16 NOTE — Anesthesia Preprocedure Evaluation (Signed)
Anesthesia Evaluation  Patient identified by MRN, date of birth, ID band Patient awake    Reviewed: Allergy & Precautions, NPO status , Patient's Chart, lab work & pertinent test results  History of Anesthesia Complications Negative for: history of anesthetic complications  Airway Mallampati: II  TM Distance: >3 FB Neck ROM: Full    Dental  (+) Poor Dentition   Pulmonary neg pulmonary ROS, neg sleep apnea, neg COPD,    breath sounds clear to auscultation- rhonchi (-) wheezing      Cardiovascular Exercise Tolerance: Good hypertension, Pt. on medications + CAD  (-) Past MI, (-) Cardiac Stents and (-) CABG + pacemaker (put in for PVCs)  Rhythm:Regular Rate:Normal - Systolic murmurs and - Diastolic murmurs    Neuro/Psych neg Seizures PSYCHIATRIC DISORDERS Anxiety Depression negative neurological ROS     GI/Hepatic negative GI ROS, Neg liver ROS,   Endo/Other  negative endocrine ROSneg diabetes  Renal/GU Renal disease: hx of nephrolithiasis.     Musculoskeletal  (+) Arthritis ,   Abdominal (+) - obese,   Peds  Hematology  (+) anemia ,   Anesthesia Other Findings Past Medical History: No date: Anxiety Non-obstructive: CAD (coronary artery disease)     Comment:  a. 2004 Cath: nonobs dzs. No date: DDD (degenerative disc disease)     Comment:  a. with chronic right sided back pain - improves after               seeing chiropractor. No date: Depression     Comment:  a. h/o ECT No date: Diverticulitis No date: HTN (hypertension) No date: Hyperlipidemia, mixed 2008: Melanoma of skin ([place])     Comment:  resected from Left arm  No date: Memory problem     Comment:  "states memory issues" No date: Nephrolithiasis No date: Pancreatic cyst     Comment:  a. [id number] Endoscopic U/S: nl UGI tract, 1-44mm pancreatic               cysts, no masses/nodules. No date: Premature ventricular contraction     Comment:  a. managed  with propafenone. No date: Presence of permanent cardiac pacemaker No date: Symptomatic bradycardia     Comment:  a. s/p MDT PPM in 08/2005;  b. 02/2010 Gen change->MDT               Adapta DC PPM, ser # [id number].   Reproductive/Obstetrics                             Anesthesia Physical Anesthesia Plan  ASA: II  Anesthesia Plan: General   Post-op Pain Management:    Induction: Intravenous  PONV Risk Score and Plan: 2 and Ondansetron  Airway Management Planned: Mask  Additional Equipment:   Intra-op Plan:   Post-operative Plan:   Informed Consent: I have reviewed the patients History and Physical, chart, labs and discussed the procedure including the risks, benefits and alternatives for the proposed anesthesia with the patient or authorized representative who has indicated his/her understanding and acceptance.   Dental advisory given  Plan Discussed with: CRNA and Anesthesiologist  Anesthesia Plan Comments:         Anesthesia Quick Evaluation

## 2018-09-16 NOTE — H&P (Signed)
Barbara Marks is an 84 y.o. female.   Chief Complaint: return of depression and mallaise HPI: hx recurrent depression  Past Medical History:  Diagnosis Date  . Anxiety   . CAD (coronary artery disease) Non-obstructive   a. 2004 Cath: nonobs dzs.  . DDD (degenerative disc disease)    a. with chronic right sided back pain - improves after seeing chiropractor.  . Depression    a. h/o ECT  . Diverticulitis   . HTN (hypertension)   . Hyperlipidemia, mixed   . Melanoma of skin ([place]) 2008   resected from Left arm   . Memory problem    "states memory issues"  . Nephrolithiasis   . Pancreatic cyst    a. [id number] Endoscopic U/S: nl UGI tract, 1-61mm pancreatic cysts, no masses/nodules.  . Premature ventricular contraction    a. managed with propafenone.  . Presence of permanent cardiac pacemaker   . Symptomatic bradycardia    a. s/p MDT PPM in 08/2005;  b. 02/2010 Gen change->MDT Adapta DC PPM, ser # [id number] H.    Past Surgical History:  Procedure Laterality Date  . APPENDECTOMY  1952  . CATARACT EXTRACTION, BILATERAL    . CHOLECYSTECTOMY  1983  . EUS N/A 06/22/2013   Procedure: UPPER ENDOSCOPIC ULTRASOUND (EUS) LINEAR;  Surgeon: Milus Banister, MD;  Location: [facility];  Service: Endoscopy;  Laterality: N/A;  . EYE SURGERY Bilateral    Cataract Extraction with IOL  . PACEMAKER INSERTION Left    [employer]  . TUMOR EXCISION     And nemamgeomas    Family History  Problem Relation Age of Onset  . Other Mother        died @ 33 - old age.  . Stroke Father        cva after cea in his 6's, died @ 69.   Social History:  reports that she has never smoked. She has never used smokeless tobacco. She reports that she does not drink alcohol or use drugs.  Allergies:  Allergies  Allergen Reactions  . Codeine     unknown  . Simvastatin Other (See Comments)    "leg cramps"    (Not in a hospital admission)   No results found for this or any previous visit (from the past 48  hour(s)). No results found.  Review of Systems  Constitutional: Negative.   HENT: Negative.   Eyes: Negative.   Respiratory: Negative.   Cardiovascular: Negative.   Gastrointestinal: Negative.   Musculoskeletal: Negative.   Skin: Negative.   Neurological: Negative.   Psychiatric/Behavioral: Positive for depression. Negative for hallucinations, memory loss, substance abuse and suicidal ideas. The patient has insomnia. The patient is not nervous/anxious.     Blood pressure (!) 150/85, pulse 83, temperature 98.1 F (36.7 C), temperature source Oral, resp. rate 16, height 5\' 6"  (1.676 m), weight 69.9 kg, SpO2 98 %. Physical Exam  Nursing note and vitals reviewed. Constitutional: She appears well-developed and well-nourished.  HENT:  Head: Normocephalic and atraumatic.  Eyes: Pupils are equal, round, and reactive to light. Conjunctivae are normal.  Neck: Normal range of motion.  Cardiovascular: Regular rhythm and normal heart sounds.  Respiratory: Effort normal. No respiratory distress.  GI: Soft.  Musculoskeletal: Normal range of motion.  Neurological: She is alert.  Skin: Skin is warm and dry.  Psychiatric: Her speech is delayed. She is slowed. Cognition and memory are normal. She exhibits a depressed mood. She expresses no suicidal ideation.     Assessment/Plan ect  today and next weds  [name], MD 09/16/2018, 12:12 PM

## 2018-09-16 NOTE — Anesthesia Postprocedure Evaluation (Signed)
Anesthesia Post Note  Patient: Barbara Marks  Procedure(s) Performed: ECT TX  Patient location during evaluation: PACU Anesthesia Type: General Level of consciousness: awake and alert Pain management: pain level controlled Vital Signs Assessment: post-procedure vital signs reviewed and stable Respiratory status: spontaneous breathing, nonlabored ventilation and respiratory function stable Cardiovascular status: blood pressure returned to baseline and stable Postop Assessment: no signs of nausea or vomiting Anesthetic complications: no     Last Vitals:  Vitals:   09/16/18 1249 09/16/18 1300  BP:  (!) 154/82  Pulse: 83 81  Resp: (!) 23 18  Temp: 36.9 C   SpO2: 97%     Last Pain:  Vitals:   09/16/18 1300  TempSrc:   PainSc: 0-No pain                 Luka Stohr

## 2018-09-16 NOTE — Discharge Instructions (Signed)
1)  The drugs that you have been given will stay in your system until tomorrow so for the       next 24 hours you should not:  A. Drive an automobile  B. Make any legal decisions  C. Drink any alcoholic beverages  2)  You may resume your regular meals upon return home.  3)  A responsible adult must take you home.  Someone should stay with you for a few          hours, then be available by phone for the remainder of the treatment day.  4)  You May experience any of the following symptoms:  Headache, Nausea and a dry mouth (due to the medications you were given),  temporary memory loss and some confusion, or sore muscles (a warm bath  should help this).  If you you experience any of these symptoms let us know on                your return visit.  5)  Report any of the following: any acute discomfort, severe headache, or temperature        greater than 100.5 F.   Also report any unusual redness, swelling, drainage, or pain         at your IV site.    You may report Symptoms to:  [place] at [facility]          Phone: [phone], ECT Department           or Dr. Prescott Gum office [phone]  6)  Your next ECT Treatment is Wednesday December 18 at 9:00  We will call 2 days prior to your scheduled appointment for arrival times.  7)  Nothing to eat or drink after midnight the night before your procedure.  8)  Take      With a sip of water the morning of your procedure.  9)  Other Instructions: Call [phone] to cancel the morning of your procedure due         to illness or emergency.  10) We will call within 72 hours to assess how you are feeling.

## 2018-09-16 NOTE — Transfer of Care (Signed)
Immediate Anesthesia Transfer of Care Note  Patient: Barbara Marks  Procedure(s) Performed: ECT TX  Patient Location: PACU  Anesthesia Type:General  Level of Consciousness: awake and alert   Airway & Oxygen Therapy: Patient Spontanous Breathing  Post-op Assessment: Report given to RN and Post -op Vital signs reviewed and stable  Post vital signs: Reviewed and stable  Last Vitals:  Vitals Value Taken Time  BP 158/78 09/16/2018 12:30 PM  Temp    Pulse 74 09/16/2018 12:31 PM  Resp 21 09/16/2018 12:31 PM  SpO2 99 % 09/16/2018 12:31 PM  Vitals shown include unvalidated device data.  Last Pain:  Vitals:   09/16/18 0909  TempSrc: Oral  PainSc: 0-No pain         Complications: No apparent anesthesia complications

## 2018-09-16 NOTE — Anesthesia Post-op Follow-up Note (Signed)
Anesthesia QCDR form completed.        

## 2018-09-16 NOTE — Procedures (Signed)
ECT SERVICES Physician's Interval Evaluation & Treatment Note  Patient Identification: Barbara Marks MRN:  [id number] Date of Evaluation:  09/16/2018 TX #: 10  MADRS:   MMSE:   P.E. Findings:  No change  Psychiatric Interval Note:  depressed  Subjective:  Patient is a 84 y.o. female seen for evaluation for Electroconvulsive Therapy. depressed  Treatment Summary:   []   Right Unilateral             []  Bilateral   % Energy : 1.0 ms 75%   Impedance: 1880 ohm  Seizure Energy Index: 8113 mvs  Postictal Suppression Index: 55%  Seizure Concordance Index: 92%  Medications  Pre Shock: tor 30, brev 80 suc 90  Post Shock:    Seizure Duration: 35 emg 70 eeg   Comments: Fu weds   Lungs:  [x]   Clear to auscultation               []  Other:   Heart:    [x]   Regular rhythm             []  irregular rhythm    [x]   Previous H&P reviewed, patient examined and there are NO CHANGES                 []   Previous H&P reviewed, patient examined and there are changes noted.   [name], MD 12/13/201912:14 PM

## 2018-09-16 NOTE — Anesthesia Procedure Notes (Signed)
Date/Time: 09/16/2018 12:21 PM Performed by: Allean Found, CRNA Pre-anesthesia Checklist: Patient identified, Emergency Drugs available, Suction available and Patient being monitored Patient Re-evaluated:Patient Re-evaluated prior to induction Oxygen Delivery Method: Circle system utilized Preoxygenation: Pre-oxygenation with 100% oxygen Induction Type: IV induction Ventilation: Mask ventilation without difficulty and Mask ventilation throughout procedure Airway Equipment and Method: Bite block Placement Confirmation: positive ETCO2 Dental Injury: Teeth and Oropharynx as per pre-operative assessment

## 2018-09-17 MED ORDER — BUPIVACAINE-EPINEPHRINE (PF) 0.5% -1:200000 IJ SOLN
INTRAMUSCULAR | Status: AC
  Filled 2018-09-17: qty 30

## 2018-09-19 ENCOUNTER — Telehealth: Payer: Self-pay

## 2018-09-21 ENCOUNTER — Encounter: Payer: Self-pay | Admitting: Anesthesiology

## 2018-09-21 ENCOUNTER — Other Ambulatory Visit: Payer: Self-pay | Admitting: Psychiatry

## 2018-09-21 ENCOUNTER — Encounter (HOSPITAL_COMMUNITY)
Admission: RE | Admit: 2018-09-21 | Discharge: 2018-09-21 | Disposition: A | Discharge status: Home / Self Care | Payer: PPO | Source: Ambulatory Visit | Attending: Psychiatry | Admitting: Psychiatry

## 2018-09-21 DIAGNOSIS — F332 Major depressive disorder, recurrent severe without psychotic features: Secondary | ICD-10-CM

## 2018-09-21 DIAGNOSIS — F339 Major depressive disorder, recurrent, unspecified: Secondary | ICD-10-CM | POA: Diagnosis not present

## 2018-09-21 DIAGNOSIS — I10 Essential (primary) hypertension: Secondary | ICD-10-CM | POA: Diagnosis not present

## 2018-09-21 DIAGNOSIS — E782 Mixed hyperlipidemia: Secondary | ICD-10-CM | POA: Diagnosis not present

## 2018-09-21 MED ORDER — SODIUM CHLORIDE 0.9 % IV SOLN
INTRAVENOUS | Status: DC | PRN
  Administered 2018-09-21: 11:00:00 via INTRAVENOUS

## 2018-09-21 MED ORDER — SUCCINYLCHOLINE CHLORIDE 20 MG/ML IJ SOLN
INTRAMUSCULAR | Status: DC | PRN
  Administered 2018-09-21: 90 mg via INTRAVENOUS

## 2018-09-21 MED ORDER — SODIUM CHLORIDE 0.9 % IV SOLN
500.0000 mL | Freq: Once | INTRAVENOUS | Status: AC
  Administered 2018-09-21: 500 mL via INTRAVENOUS

## 2018-09-21 MED ORDER — METHOHEXITAL SODIUM 0.5 G IJ SOLR
INTRAMUSCULAR | Status: AC
  Filled 2018-09-21: qty 500

## 2018-09-21 MED ORDER — METHOHEXITAL SODIUM 100 MG/10ML IV SOSY
PREFILLED_SYRINGE | INTRAVENOUS | Status: DC | PRN
  Administered 2018-09-21: 80 mg via INTRAVENOUS

## 2018-09-21 NOTE — Procedures (Signed)
ECT SERVICES Physician's Interval Evaluation & Treatment Note  Patient Identification: Barbara Marks MRN:  [id number] Date of Evaluation:  09/21/2018 TX #: 11  MADRS:   MMSE:   P.E. Findings:  No change in physical exam  Psychiatric Interval Note:  Mood is feeling a little better more stable  Subjective:  Patient is a 84 y.o. female seen for evaluation for Electroconvulsive Therapy. Patient says she is feeling a little better  Treatment Summary:   []   Right Unilateral             [x]  Bilateral   % Energy : 1.0 ms 75%   Impedance: 2730 ohms  Seizure Energy Index: 3717 V clear  Postictal Suppression Index: 69%  Seizure Concordance Index: 90%  Medications  Pre Shock: Toradol 30 mg Brevital 80 mg succinylcholine 90 mg  Post Shock:    Seizure Duration: 55 seconds EMG 55 seconds EEG   Comments: Follow-up as needed  Lungs:  [x]   Clear to auscultation               []  Other:   Heart:    [x]   Regular rhythm             []  irregular rhythm    [x]   Previous H&P reviewed, patient examined and there are NO CHANGES                 []   Previous H&P reviewed, patient examined and there are changes noted.   [name], MD 12/18/201911:26 AM

## 2018-09-21 NOTE — Anesthesia Post-op Follow-up Note (Signed)
Anesthesia QCDR form completed.        

## 2018-09-21 NOTE — H&P (Signed)
Barbara Marks is an 84 y.o. female.   Chief Complaint: Mood is improved.  A little more energetic. HPI: History of chronic depression with good response to ECT  Past Medical History:  Diagnosis Date  . Anxiety   . CAD (coronary artery disease) Non-obstructive   a. 2004 Cath: nonobs dzs.  . DDD (degenerative disc disease)    a. with chronic right sided back pain - improves after seeing chiropractor.  . Depression    a. h/o ECT  . Diverticulitis   . HTN (hypertension)   . Hyperlipidemia, mixed   . Melanoma of skin ([place]) 2008   resected from Left arm   . Memory problem    "states memory issues"  . Nephrolithiasis   . Pancreatic cyst    a. [id number] Endoscopic U/S: nl UGI tract, 1-61mm pancreatic cysts, no masses/nodules.  . Premature ventricular contraction    a. managed with propafenone.  . Presence of permanent cardiac pacemaker   . Symptomatic bradycardia    a. s/p MDT PPM in 08/2005;  b. 02/2010 Gen change->MDT Adapta DC PPM, ser # [id number] H.    Past Surgical History:  Procedure Laterality Date  . APPENDECTOMY  1952  . CATARACT EXTRACTION, BILATERAL    . CHOLECYSTECTOMY  1983  . EUS N/A 06/22/2013   Procedure: UPPER ENDOSCOPIC ULTRASOUND (EUS) LINEAR;  Surgeon: Milus Banister, MD;  Location: [facility];  Service: Endoscopy;  Laterality: N/A;  . EYE SURGERY Bilateral    Cataract Extraction with IOL  . PACEMAKER INSERTION Left    [employer]  . TUMOR EXCISION     And nemamgeomas    Family History  Problem Relation Age of Onset  . Other Mother        died @ 42 - old age.  . Stroke Father        cva after cea in his 29's, died @ 54.   Social History:  reports that she has never smoked. She has never used smokeless tobacco. She reports that she does not drink alcohol or use drugs.  Allergies:  Allergies  Allergen Reactions  . Codeine     unknown  . Simvastatin Other (See Comments)    "leg cramps"    (Not in a hospital admission)   No results found for this  or any previous visit (from the past 48 hour(s)). No results found.  Review of Systems  Constitutional: Negative.   HENT: Negative.   Eyes: Negative.   Respiratory: Negative.   Cardiovascular: Negative.   Gastrointestinal: Negative.   Musculoskeletal: Negative.   Skin: Negative.   Neurological: Negative.   Psychiatric/Behavioral: Negative.     Blood pressure (!) 145/66, pulse 78, temperature 97.6 F (36.4 C), temperature source Oral, resp. rate 16, height 5\' 6"  (1.676 m), weight 68.9 kg, SpO2 100 %. Physical Exam  Nursing note and vitals reviewed. Constitutional: She appears well-developed and well-nourished.  HENT:  Head: Normocephalic and atraumatic.  Eyes: Pupils are equal, round, and reactive to light. Conjunctivae are normal.  Neck: Normal range of motion.  Cardiovascular: Regular rhythm and normal heart sounds.  Respiratory: Effort normal.  GI: Soft.  Musculoskeletal: Normal range of motion.  Neurological: She is alert.  Skin: Skin is warm and dry.  Psychiatric: She has a normal mood and affect. Her behavior is normal. Judgment and thought content normal.     Assessment/Plan Second ECT treatment in a sequence this time and then we will see her back as needed  [name], MD 09/21/2018,  11:24 AM

## 2018-09-21 NOTE — Anesthesia Postprocedure Evaluation (Signed)
Anesthesia Post Note  Patient: Barbara Marks  Procedure(s) Performed: ECT TX  Patient location during evaluation: PACU Anesthesia Type: General Level of consciousness: awake and alert Pain management: pain level controlled Vital Signs Assessment: post-procedure vital signs reviewed and stable Respiratory status: spontaneous breathing, nonlabored ventilation, respiratory function stable and patient connected to nasal cannula oxygen Cardiovascular status: blood pressure returned to baseline and stable Postop Assessment: no apparent nausea or vomiting Anesthetic complications: no     Last Vitals:  Vitals:   09/21/18 1153 09/21/18 1214  BP: (!) 157/78 132/70  Pulse: 77 75  Resp:    Temp:    SpO2: 99% 96%    Last Pain:  Vitals:   09/21/18 1143  TempSrc:   PainSc: Asleep                 Precious Haws Chloe Bluett

## 2018-09-21 NOTE — Transfer of Care (Signed)
Immediate Anesthesia Transfer of Care Note  Patient: Barbara Marks  Procedure(s) Performed: ECT TX  Patient Location: PACU  Anesthesia Type:General  Level of Consciousness: drowsy and patient cooperative  Airway & Oxygen Therapy: Patient Spontanous Breathing and Patient connected to face mask oxygen  Post-op Assessment: Report given to RN and Post -op Vital signs reviewed and stable  Post vital signs: Reviewed and stable  Last Vitals:  Vitals Value Taken Time  BP 168/80 09/21/2018 11:43 AM  Temp 36.5 C 09/21/2018 11:43 AM  Pulse 72 09/21/2018 11:46 AM  Resp 16 09/21/2018 11:43 AM  SpO2 100 % 09/21/2018 11:46 AM  Vitals shown include unvalidated device data.  Last Pain:  Vitals:   09/21/18 0927  TempSrc: Oral  PainSc: 0-No pain         Complications: No apparent anesthesia complications

## 2018-09-21 NOTE — Discharge Instructions (Signed)
1)  The drugs that you have been given will stay in your system until tomorrow so for the       next 24 hours you should not:  A. Drive an automobile  B. Make any legal decisions  C. Drink any alcoholic beverages  2)  You may resume your regular meals upon return home.  3)  A responsible adult must take you home.  Someone should stay with you for a few          hours, then be available by phone for the remainder of the treatment day.  4)  You May experience any of the following symptoms:  Headache, Nausea and a dry mouth (due to the medications you were given),  temporary memory loss and some confusion, or sore muscles (a warm bath  should help this).  If you you experience any of these symptoms let us know on                your return visit.  5)  Report any of the following: any acute discomfort, severe headache, or temperature        greater than 100.5 F.   Also report any unusual redness, swelling, drainage, or pain         at your IV site.    You may report Symptoms to:  [place] [facility]          Phone: [phone], ECT Department           or Dr. Prescott Gum office [phone]  6)  Your next ECT Treatment is WHEN YOU CALL us   We will call 2 days prior to your scheduled appointment for arrival times.  7)  Nothing to eat or drink after midnight the night before your procedure.  8)  Take     With a sip of water the morning of your procedure.  9)  Other Instructions: Call [phone] to cancel the morning of your procedure due         to illness or emergency.  10) We will call within 72 hours to assess how you are feeling.

## 2018-09-21 NOTE — Anesthesia Preprocedure Evaluation (Signed)
Anesthesia Evaluation  Patient identified by MRN, date of birth, ID band Patient awake    Reviewed: Allergy & Precautions, H&P , NPO status , Patient's Chart, lab work & pertinent test results, reviewed documented beta blocker date and time   History of Anesthesia Complications Negative for: history of anesthetic complications  Airway Mallampati: II  TM Distance: >3 FB Neck ROM: full    Dental  (+) Chipped   Pulmonary neg shortness of breath,           Cardiovascular Exercise Tolerance: Good hypertension, Pt. on medications + CAD  + dysrhythmias + pacemaker      Neuro/Psych PSYCHIATRIC DISORDERS Anxiety Depression negative neurological ROS     GI/Hepatic negative GI ROS, Neg liver ROS,   Endo/Other  negative endocrine ROS  Renal/GU Renal disease  negative genitourinary   Musculoskeletal  (+) Arthritis ,   Abdominal   Peds  Hematology negative hematology ROS (+) anemia ,   Anesthesia Other Findings Past Medical History: No date: Anxiety Non-obstructive: CAD (coronary artery disease)     Comment:  a. 2004 Cath: nonobs dzs. No date: DDD (degenerative disc disease)     Comment:  a. with chronic right sided back pain - improves after               seeing chiropractor. No date: Depression     Comment:  a. h/o ECT No date: Diverticulitis No date: HTN (hypertension) No date: Hyperlipidemia, mixed 2008: Melanoma of skin ([place])     Comment:  resected from Left arm  No date: Memory problem     Comment:  "states memory issues" No date: Nephrolithiasis No date: Pancreatic cyst     Comment:  a. [id number] Endoscopic U/S: nl UGI tract, 1-76mm pancreatic               cysts, no masses/nodules. No date: Premature ventricular contraction     Comment:  a. managed with propafenone. No date: Presence of permanent cardiac pacemaker No date: Symptomatic bradycardia     Comment:  a. s/p MDT PPM in 08/2005;  b. 02/2010 Gen  change->MDT               Adapta DC PPM, ser # [id number] H.   Reproductive/Obstetrics negative OB ROS                                                              Anesthesia Evaluation  Patient identified by MRN, date of birth, ID band Patient awake    Reviewed: Allergy & Precautions, H&P , NPO status , Patient's Chart, lab work & pertinent test results, reviewed documented beta blocker date and time   Airway Mallampati: II  TM Distance: >3 FB Neck ROM: limited    Dental  (+) Poor Dentition, Chipped, Missing, Partial Lower, Partial Upper   Pulmonary neg pulmonary ROS,    Pulmonary exam normal        Cardiovascular hypertension, + CAD  negative cardio ROS Normal cardiovascular exam+ dysrhythmias + pacemaker  Rate:Normal     Neuro/Psych PSYCHIATRIC DISORDERS Anxiety Depression negative neurological ROS  negative psych ROS   GI/Hepatic negative GI ROS, Neg liver ROS,   Endo/Other  negative endocrine ROS  Renal/GU Renal diseasenegative Renal ROS  negative genitourinary   Musculoskeletal  (+) Arthritis ,  Abdominal   Peds  Hematology negative hematology ROS (+) anemia ,   Anesthesia Other Findings Past Medical History: No date: Anxiety Non-obstructive: CAD (coronary artery disease)     Comment: a. 2004 Cath: nonobs dzs. No date: Cancer [facility])     Comment: melanoma skin cancer No date: DDD (degenerative disc disease)     Comment: a. with chronic right sided back pain -               improves after seeing chiropractor. No date: Depression     Comment: a. h/o ECT No date: Diverticulitis No date: HTN (hypertension) No date: Hyperlipidemia, mixed No date: Memory problem     Comment: "states memory issues" No date: Nephrolithiasis No date: Pancreatic cyst     Comment: a. [id number] Endoscopic U/S: nl UGI tract, 1-63mm               pancreatic cysts, no masses/nodules. No date: Premature ventricular contraction     Comment: a.  managed with propafenone. No date: Presence of permanent cardiac pacemaker No date: Symptomatic bradycardia     Comment: a. s/p MDT PPM in 08/2005;  b. 02/2010 Gen               change->MDT Adapta DC PPM, ser # [id number] H. Past Surgical History: 1952: APPENDECTOMY No date: CATARACT EXTRACTION, BILATERAL 1983: CHOLECYSTECTOMY 06/22/2013: EUS N/A     Comment: Procedure: UPPER ENDOSCOPIC ULTRASOUND (EUS)               LINEAR;  Surgeon: Milus Banister, MD;                Location: [facility];  Service: Endoscopy;                Laterality: N/A; No date: EYE SURGERY Bilateral     Comment: Cataract Extraction with IOL No date: PACEMAKER INSERTION Left     Comment: Medtronic No date: TUMOR EXCISION     Comment: And nemamgeomas BMI    Body Mass Index:  25.02 kg/m     Reproductive/Obstetrics                             Anesthesia Physical  Anesthesia Plan  ASA: III  Anesthesia Plan: General   Post-op Pain Management:    Induction:   Airway Management Planned:   Additional Equipment:   Intra-op Plan:   Post-operative Plan:   Informed Consent: I have reviewed the patients History and Physical, chart, labs and discussed the procedure including the risks, benefits and alternatives for the proposed anesthesia with the patient or authorized representative who has indicated his/her understanding and acceptance.   Dental Advisory Given  Plan Discussed with: CRNA  Anesthesia Plan Comments:         Anesthesia Quick Evaluation                                   Anesthesia Evaluation  Patient identified by MRN, date of birth, ID band Patient awake    Reviewed: Allergy & Precautions, H&P , NPO status , Patient's Chart, lab work & pertinent test results, reviewed documented beta blocker date and time   Airway Mallampati: II  TM Distance: >3 FB Neck ROM: limited    Dental  (+) Poor Dentition, Chipped, Missing, Partial Lower, Partial  Upper   Pulmonary neg pulmonary ROS,  Pulmonary exam normal        Cardiovascular hypertension, + CAD  negative cardio ROS Normal cardiovascular exam+ dysrhythmias + pacemaker  Rate:Normal     Neuro/Psych PSYCHIATRIC DISORDERS Anxiety Depression negative neurological ROS  negative psych ROS   GI/Hepatic negative GI ROS, Neg liver ROS,   Endo/Other  negative endocrine ROS  Renal/GU Renal diseasenegative Renal ROS  negative genitourinary   Musculoskeletal  (+) Arthritis ,   Abdominal   Peds  Hematology negative hematology ROS (+) anemia ,   Anesthesia Other Findings Past Medical History: No date: Anxiety Non-obstructive: CAD (coronary artery disease)     Comment: a. 2004 Cath: nonobs dzs. No date: Cancer [facility])     Comment: melanoma skin cancer No date: DDD (degenerative disc disease)     Comment: a. with chronic right sided back pain -               improves after seeing chiropractor. No date: Depression     Comment: a. h/o ECT No date: Diverticulitis No date: HTN (hypertension) No date: Hyperlipidemia, mixed No date: Memory problem     Comment: "states memory issues" No date: Nephrolithiasis No date: Pancreatic cyst     Comment: a. [id number] Endoscopic U/S: nl UGI tract, 1-48mm               pancreatic cysts, no masses/nodules. No date: Premature ventricular contraction     Comment: a. managed with propafenone. No date: Presence of permanent cardiac pacemaker No date: Symptomatic bradycardia     Comment: a. s/p MDT PPM in 08/2005;  b. 02/2010 Gen               change->MDT Adapta DC PPM, ser # [id number] H. Past Surgical History: 1952: APPENDECTOMY No date: CATARACT EXTRACTION, BILATERAL 1983: CHOLECYSTECTOMY 06/22/2013: EUS N/A     Comment: Procedure: UPPER ENDOSCOPIC ULTRASOUND (EUS)               LINEAR;  Surgeon: Milus Banister, MD;                Location: [facility];  Service: Endoscopy;                Laterality: N/A; No date: EYE SURGERY  Bilateral     Comment: Cataract Extraction with IOL No date: PACEMAKER INSERTION Left     Comment: Medtronic No date: TUMOR EXCISION     Comment: And nemamgeomas BMI    Body Mass Index:  25.02 kg/m     Reproductive/Obstetrics                             Anesthesia Physical  Anesthesia Plan  ASA: III  Anesthesia Plan: General   Post-op Pain Management:    Induction:   Airway Management Planned:   Additional Equipment:   Intra-op Plan:   Post-operative Plan:   Informed Consent: I have reviewed the patients History and Physical, chart, labs and discussed the procedure including the risks, benefits and alternatives for the proposed anesthesia with the patient or authorized representative who has indicated his/her understanding and acceptance.   Dental Advisory Given  Plan Discussed with: CRNA  Anesthesia Plan Comments:         Anesthesia Quick Evaluation  Anesthesia Physical  Anesthesia Plan  ASA: III  Anesthesia Plan: General   Post-op Pain Management:    Induction: Intravenous  PONV Risk Score and Plan:   Airway Management Planned: Mask  Additional Equipment:   Intra-op Plan:   Post-operative Plan:   Informed Consent: I have reviewed the patients History and Physical, chart, labs and discussed the procedure including the risks, benefits and alternatives for the proposed anesthesia with the patient or authorized representative who has indicated his/her understanding and acceptance.   Dental Advisory Given  Plan Discussed with: CRNA  Anesthesia Plan Comments: (Patient consented for risks of anesthesia including but not limited to:  - adverse reactions to medications - risk of intubation if required - damage to teeth, lips or other oral mucosa - sore throat or hoarseness - Damage to heart, brain, lungs or loss of life  Patient voiced understanding.)        Anesthesia Quick Evaluation

## 2018-10-24 ENCOUNTER — Ambulatory Visit (INDEPENDENT_AMBULATORY_CARE_PROVIDER_SITE_OTHER): Payer: PPO | Admitting: Family Medicine

## 2018-10-24 ENCOUNTER — Encounter: Payer: Self-pay | Admitting: Family Medicine

## 2018-10-24 ENCOUNTER — Telehealth: Payer: Self-pay | Admitting: *Deleted

## 2018-10-24 VITALS — BP 128/64 | HR 76 | Temp 98.4°F | Ht 66.0 in | Wt 155.5 lb

## 2018-10-24 DIAGNOSIS — R140 Abdominal distension (gaseous): Secondary | ICD-10-CM | POA: Diagnosis not present

## 2018-10-24 DIAGNOSIS — L97511 Non-pressure chronic ulcer of other part of right foot limited to breakdown of skin: Secondary | ICD-10-CM | POA: Diagnosis not present

## 2018-10-24 DIAGNOSIS — F332 Major depressive disorder, recurrent severe without psychotic features: Secondary | ICD-10-CM | POA: Diagnosis not present

## 2018-10-24 NOTE — Telephone Encounter (Signed)
Updated order  Also ordered additional Cr if needed

## 2018-10-24 NOTE — Telephone Encounter (Signed)
Spoke to Barbara Marks with [facility], who states pts CT order needs to be changed to CT abd and pelvis with contrast. She is not allergic to the dye and her kidney function is ok. pls advise

## 2018-10-24 NOTE — Assessment & Plan Note (Signed)
Ulcer present. No sign of infection. Spacers between toes to reduce pressure. Return if not improving in next few weeks.

## 2018-10-24 NOTE — Assessment & Plan Note (Signed)
Sees psychiatry and gets ECT. Doing well per patient

## 2018-10-24 NOTE — Addendum Note (Signed)
Addended by: Lesleigh Noe on: 10/24/2018 05:13 PM   Modules accepted: Orders

## 2018-10-24 NOTE — Progress Notes (Signed)
Subjective:     ALONDA WEABER is a 85 y.o. female presenting for Establish Care (previous PCP was Dr. Lovie Macadamia with [facility] Toe Pain (patient did hit her toe a while ago, not sure when. this is still bothering her.); and Abdominal Pain (right lower abdomen, bloated sensation often. Does have an issue with constipation and drinks Prune juice for this. )     Toe Pain    Abdominal Pain  Associated symptoms include constipation. Pertinent negatives include no fever, nausea or vomiting.    #abdominal pain - RUQ - bloated sensation - in September went to PCP office and suggested that it might be scar tissue and to get colonoscopy - but she canceled this - has had hemorrhoids and surgery for them and didn't want ot have a scope - feels bloated - drinks prune juice daily -- which improved constipation issues -- usually once a day - soft stools, occasionally it is a little - symptoms are constant - nothing making it worse - nothing seems to improve symptoms   Toe pain - injury - a few months ago - bumped it - pain started 2 months ago (not initially) - noticed an open sore between the toes - used neosporin  - pain at night - sore still present  #Depression/anxiety - doing ECT x 30 years  - severe symptoms - not eating/talking - is helping significantly but still with low interest in things  #melanoma - sees dermatology  Review of Systems  Constitutional: Negative for chills, fatigue, fever and unexpected weight change.  Gastrointestinal: Positive for abdominal pain and constipation. Negative for blood in stool, nausea and vomiting.     Social History   Tobacco Use  Smoking Status Never Smoker  Smokeless Tobacco Never Used        Objective:    BP Readings from Last 3 Encounters:  10/24/18 128/64  10/28/16 124/64  11/19/15 (!) 141/59   Wt Readings from Last 3 Encounters:  10/24/18 155 lb 8 oz (70.5 kg)  10/28/16 157 lb 12 oz (71.6 kg)    11/19/15 161 lb (73 kg)    BP 128/64   Pulse 76   Temp 98.4 F (36.9 C)   Ht 5\' 6"  (1.676 m)   Wt 155 lb 8 oz (70.5 kg)   SpO2 98%   BMI 25.10 kg/m    Physical Exam Constitutional:      General: She is not in acute distress.    Appearance: She is well-developed. She is not diaphoretic.  HENT:     Head: Normocephalic and atraumatic.     Right Ear: External ear normal.     Left Ear: External ear normal.     Nose: Nose normal.  Eyes:     General: No scleral icterus.    Conjunctiva/sclera: Conjunctivae normal.  Neck:     Musculoskeletal: Neck supple.  Cardiovascular:     Rate and Rhythm: Normal rate and regular rhythm.     Heart sounds: Murmur (systolic) present.  Pulmonary:     Effort: Pulmonary effort is normal. No respiratory distress.     Breath sounds: Normal breath sounds. No wheezing.  Abdominal:     General: Bowel sounds are normal. There is no distension.     Palpations: Abdomen is soft. There is no mass.     Tenderness: There is abdominal tenderness in the right lower quadrant, periumbilical area and suprapubic area. There is no guarding or rebound.  Musculoskeletal: Normal range of motion.  Feet:     Comments: Right fifth digit with stage 1 ulcer on the inner space between the fourth and fifth digit. No erythema.  Lymphadenopathy:     Cervical: No cervical adenopathy.  Skin:    General: Skin is warm and dry.     Capillary Refill: Capillary refill takes less than 2 seconds.  Neurological:     Mental Status: She is alert and oriented to person, place, and time.     Deep Tendon Reflexes: Reflexes normal.  Psychiatric:        Behavior: Behavior normal.           Assessment & Plan:   Problem List Items Addressed This Visit      Musculoskeletal and Integument   Skin ulcer of toe of right foot, limited to breakdown of skin (HCC)    Ulcer present. No sign of infection. Spacers between toes to reduce pressure. Return if not improving in next few weeks.          Other   Severe recurrent major depression without psychotic features [facility])    Sees psychiatry and gets ECT. Doing well per patient      Abdominal distension (gaseous) - Primary    Pt with bloating and mildly tender abdomen. Given hx of surgeries and age will with CT to eval for adhesions or tumors.       Relevant Orders   CT Abdomen Pelvis Wo Contrast       Return if symptoms worsen or fail to improve.  Lesleigh Noe, MD

## 2018-10-24 NOTE — Assessment & Plan Note (Signed)
Pt with bloating and mildly tender abdomen. Given hx of surgeries and age will with CT to eval for adhesions or tumors.

## 2018-10-24 NOTE — Patient Instructions (Addendum)
#   Toe Pain - Wear a toe separator as much as possible - continue to use neosporin a few times a day - If redness or worsening, return - if not improved or resolved in 1 month let me know  #Abdominal Pain - Will get CT Scan - you could Gas-X medication to see if that helps

## 2018-10-31 ENCOUNTER — Ambulatory Visit
Admission: RE | Admit: 2018-10-31 | Discharge: 2018-10-31 | Disposition: A | Discharge status: Home / Self Care | Payer: PPO | Source: Ambulatory Visit | Attending: Family Medicine | Admitting: Family Medicine

## 2018-10-31 DIAGNOSIS — K5730 Diverticulosis of large intestine without perforation or abscess without bleeding: Secondary | ICD-10-CM | POA: Diagnosis not present

## 2018-10-31 DIAGNOSIS — K5732 Diverticulitis of large intestine without perforation or abscess without bleeding: Secondary | ICD-10-CM | POA: Insufficient documentation

## 2018-10-31 DIAGNOSIS — R140 Abdominal distension (gaseous): Secondary | ICD-10-CM | POA: Diagnosis not present

## 2018-10-31 DIAGNOSIS — N200 Calculus of kidney: Secondary | ICD-10-CM | POA: Diagnosis not present

## 2018-10-31 LAB — POCT I-STAT CREATININE: Creatinine, Ser: 0.8 mg/dL (ref 0.44–1.00)

## 2018-10-31 MED ORDER — IOPAMIDOL (ISOVUE-300) INJECTION 61%
75.0000 mL | Freq: Once | INTRAVENOUS | Status: AC | PRN
  Administered 2018-10-31: 75 mL via INTRAVENOUS

## 2018-11-01 ENCOUNTER — Telehealth: Payer: Self-pay | Admitting: Family Medicine

## 2018-11-01 DIAGNOSIS — K5792 Diverticulitis of intestine, part unspecified, without perforation or abscess without bleeding: Secondary | ICD-10-CM

## 2018-11-01 MED ORDER — CIPROFLOXACIN HCL 500 MG PO TABS: 500.0000 mg | ORAL_TABLET | Freq: Two times a day (BID) | ORAL | 0 refills | Status: AC

## 2018-11-01 MED ORDER — METRONIDAZOLE 500 MG PO TABS: 500.0000 mg | ORAL_TABLET | Freq: Three times a day (TID) | ORAL | 0 refills | Status: DC

## 2018-11-01 NOTE — Telephone Encounter (Signed)
Called patient to review CT imagine results.   Left voicemail to call back.    CT Scan showed 2 main things which could be explaining the symptoms.   1) Diverticulitis - inflammation of the colon -- this is treated with 2 antibiotics which I will send to the pharmacy  2) Constipation - this is most likely the cause of the abdominal symptoms  -- The antibiotics may cause some diarrhea but after you finish the antibiotics I would recommend you start taking Colace (stool softener) daily.    No sign of adhesions.

## 2018-11-01 NOTE — Telephone Encounter (Signed)
-   Called patient   No sign of adhesions on exam.   Does endorse using prune juice, but having pellet stools  She will get the antibiotics and start the stool softener.

## 2018-11-01 NOTE — Telephone Encounter (Signed)
Patient advised as below. Patient had a question. When she a NP last year for her abdomen pain/symptoms she was told that she could be having some scar tissue around her intestines and she was wondering if that was mentioned in the report. She also said that she was told about diverticulitis last time too but she was not hurting. She is hurting on the right side and would the pain from diverticulitis cause the pain on the opposite side? Patient is going to get medications that were sent in and see how she does after competing this course.

## 2018-11-02 ENCOUNTER — Other Ambulatory Visit: Payer: Self-pay

## 2018-11-02 NOTE — Telephone Encounter (Signed)
Patient is calling to request medication refills on the following:  1.  Amlodipine 5mg    2. Klor Con 40meq  Both r/x's have never been filled by our office before. Patient was seen on 10/24/18 for her establish care visit.  I have LM for patient to return call and clarify that she is taking the medications as listed 1 pill daily and confirm pharmacy. I will pend #30, 5 refills on each for Dr. Verda Cumins approval after clarifying with patient.

## 2018-11-03 MED ORDER — POTASSIUM CHLORIDE ER 10 MEQ PO TBCR: 10.0000 meq | EXTENDED_RELEASE_TABLET | Freq: Every day | ORAL | 3 refills | Status: DC

## 2018-11-03 MED ORDER — AMLODIPINE BESYLATE 5 MG PO TABS: 5.0000 mg | ORAL_TABLET | Freq: Every morning | ORAL | 3 refills | Status: DC

## 2018-11-03 NOTE — Telephone Encounter (Signed)
Patient left message on triage this am returning my call to clarify R/X information below. Will forward this to Dr. Verda Cumins CMA, Azalee Course to follow up with patient.   Thanks.

## 2018-11-03 NOTE — Telephone Encounter (Signed)
Spoke to pt who states she was returning a call from [place].

## 2018-11-03 NOTE — Telephone Encounter (Signed)
Pt contacted office back inquiring about previous message

## 2018-11-03 NOTE — Telephone Encounter (Signed)
Patient returned my call regarding medication refills.  Not responding to this message string.  Please refer to Refill phone message from 11/03/18.

## 2018-11-03 NOTE — Telephone Encounter (Signed)
Spoke with patient. Yes she is taking 1 tablet daily for both medications. She prefers 90 day supply. Changed the quantity and will let Dr. Einar Pheasant decide on additional refills. Thank you

## 2018-11-14 ENCOUNTER — Telehealth: Payer: Self-pay

## 2018-11-14 DIAGNOSIS — R195 Other fecal abnormalities: Secondary | ICD-10-CM

## 2018-11-14 DIAGNOSIS — R140 Abdominal distension (gaseous): Secondary | ICD-10-CM

## 2018-11-14 NOTE — Telephone Encounter (Signed)
[facility]  Patient Name: Barbara Marks Gender: Female DOB: [birth year]  Age: 85 Y 2 M 18 D Return Phone Number: [phone] (Primary) Address:  City/State/Zip: [place]  Client [place] Night - Client Client Site [facility] - PHYSICIAN, NOT LISTED- MD Contact Type Call Who Is Calling Patient / Member / Family / Caregiver Call Type Triage / Clinical Relationship To Patient Self Return Phone Number [phone] (Primary) Chief Complaint Stool - Unusual Color Reason for Call Symptomatic / Request for [place] states her stool is very dark. Was given medication to take for it but not working.Dr Fara Boros Translation No Nurse Assessment Nurse: Jimmye Norman, RN, [place] Date/Time (Eastern Time): 11/12/2018 9:53:41 AM Confirm and document reason for call. If symptomatic, describe symptoms. ---Caller states her stool is very dark. Was given medication to take for irritation and her bowels had turned dark, she took it for 7 days and her stools became normal. Also prescribed colace in case constipation occurred. States she ended the medication, and now the dark stools came back. seems to be black/green color. Finished the medication on Wednesday, stool color came back Thursday night. Has the patient traveled to [place] OR had close contact with a person known to have the novel coronavirus illness in the last 14 days? ---Not Applicable Does the patient have any new or worsening symptoms? ---Yes Will a triage be completed? ---Yes Related visit to physician within the last 2 weeks? ---Yes Does the PT have any chronic conditions? (i.e. diabetes, asthma, this includes High risk factors for pregnancy, etc.) ---Yes List chronic conditions. ---diverticulitits Is this a behavioral health or substance abuse call?  ---No Guidelines Guideline Title Affirmed Question Affirmed Notes Nurse Date/Time Eilene Ghazi Time) Rectal Bleeding Tarry or jet black-colored stool (not dark green)  Jimmye Norman, RN, Whitney 11/12/2018 9:57:09 AM Disp. Time Eilene Ghazi Time) Disposition Final User 11/12/2018 10:02:22 AM Go to ED Now Yes Jimmye Norman, RN, Whitney Caller Disagree/Comply Disagree Caller Understands Yes PreDisposition InappropriateToAsk Care Advice Given Per Guideline GO TO ED NOW: Referrals GO TO FACILITY REFUSED

## 2018-11-14 NOTE — Telephone Encounter (Signed)
Dark stools are not a typical symptom of IBS.   I think that she should see GI for her symptoms.   I do not think additional antibiotics would be helpful.   Would recommend follow-up appointment if she has more questions or wants to discuss further, but would also recommend GI.

## 2018-11-14 NOTE — Telephone Encounter (Signed)
Spoke with patient. Patient was wondering if you think this is IBS issue possibly? Also wondering if she could take Cipro and Flagyl again for another round because bowels improved on that regimen? Pharmacy correct in the chart.  She is taking Colace daily also and her bowels are loose now. NO vomiting, no fever.

## 2018-11-14 NOTE — Telephone Encounter (Signed)
Pt is calling stating she finished rx for GI issues.  Sxs seemed to improve for a few days but stools are dark again.  She wanted to make Dr. Einar Pheasant aware and is asking could she possibly have IBS? Pls advise pt at [phone].

## 2018-11-14 NOTE — Telephone Encounter (Signed)
I recommend that she see GI to be further evaluated.   I've also placed an order to look for blood in her stool as dark stools sometimes indicate that blood is in the stools

## 2018-11-14 NOTE — Telephone Encounter (Signed)
Please review

## 2018-11-15 ENCOUNTER — Encounter

## 2018-11-15 ENCOUNTER — Ambulatory Visit: Payer: Self-pay | Admitting: Sports Medicine

## 2018-11-15 NOTE — Telephone Encounter (Signed)
Just FYI Patient advised. Patient states her stools are actually better today and not dark like they were. She had blackberries the other day and thinks maybe that is why her stools turned. She wants to wait and see how she does for a few days. Does not want to get stool kit done at this time (cancelled order). Also she does not want to go back to [facility] office and I provided information for [facility] office if she decides to be evaluated by that specialist. Patient will call us back if she needs a referral if she does decide to go there. Patient was advised for worsening symptoms or any concerns to come back in and discuss.

## 2018-11-15 NOTE — Addendum Note (Signed)
Addended by: Kris Mouton on: 11/15/2018 04:10 PM   Modules accepted: Orders

## 2018-11-17 ENCOUNTER — Encounter: Payer: Self-pay | Admitting: Podiatry

## 2018-11-17 ENCOUNTER — Ambulatory Visit: Payer: PPO | Admitting: Podiatry

## 2018-11-17 ENCOUNTER — Other Ambulatory Visit: Payer: Self-pay | Admitting: Podiatry

## 2018-11-17 ENCOUNTER — Ambulatory Visit (INDEPENDENT_AMBULATORY_CARE_PROVIDER_SITE_OTHER): Payer: PPO

## 2018-11-17 VITALS — BP 147/89 | HR 83

## 2018-11-17 DIAGNOSIS — M719 Bursopathy, unspecified: Secondary | ICD-10-CM

## 2018-11-17 DIAGNOSIS — S92514A Nondisplaced fracture of proximal phalanx of right lesser toe(s), initial encounter for closed fracture: Secondary | ICD-10-CM

## 2018-11-17 DIAGNOSIS — S92515A Nondisplaced fracture of proximal phalanx of left lesser toe(s), initial encounter for closed fracture: Secondary | ICD-10-CM

## 2018-11-17 DIAGNOSIS — S92511A Displaced fracture of proximal phalanx of right lesser toe(s), initial encounter for closed fracture: Secondary | ICD-10-CM

## 2018-11-17 NOTE — Progress Notes (Signed)
This patient presents the office with chief complaint of a painful fifth toe right foot.  She says that the pain has been present for approximately 2 months and it comes and goes.  She says that she experiences sharp radiating pain through the fifth toe especially when sleeping.  She says she has applied Neosporin tried Epson salts and toe separators but the problem persists. Patient says she may have injured her toe herself. Patient presents the office today for an evaluation and treatment of  her painful fifth toe right foot.  Vascular  Dorsalis pedis and posterior tibial pulses are palpable  B/L.  Capillary return  WNL.  Temperature gradient is  WNL.  Skin turgor  WNL  Sensorium  Senn Weinstein monofilament wire  WNL. Normal tactile sensation.  Nail Exam  Patient has normal nails with no evidence of bacterial or fungal infection.  Orthopedic  Exam  Muscle tone and muscle strength  WNL.  No limitations of motion feet  B/L.  No crepitus or joint effusion noted.  Palpable pain noted upon palpation of the PIPJ of the fifth toe right foot.  Patient does have mild swelling noted of the fifth toe of the right foot.  DJD lLz-Frank  B/L.  Skin  No open lesions.  Normal skin texture and turgor.  Bursitis fifth toe right foot.  IE.  X-ray reveals no bony pathology fifth toe right.  Injection therapy fifth toe.  RTC prn.   Gardiner Barefoot DPM

## 2018-11-21 ENCOUNTER — Ambulatory Visit: Payer: Self-pay | Admitting: Podiatry

## 2018-11-29 ENCOUNTER — Encounter: Payer: Self-pay | Admitting: Internal Medicine

## 2018-12-02 ENCOUNTER — Telehealth: Payer: Self-pay | Admitting: Internal Medicine

## 2018-12-02 NOTE — Telephone Encounter (Signed)
New Message    1. Has your device fired? NO  2. Is you device beeping? No  3. Are you experiencing draining or swelling at device site? NO  4. Are you calling to see if we received your device transmission? NO  5. Have you passed out? NO  Patient states she received a letter for a physical check of pacemaker but states it's been off for a few years now and she just wants to know if she should come in for an appointment to have it checked.    Please route to [place]

## 2018-12-02 NOTE — Telephone Encounter (Signed)
Spoke w/ pt and informed her that when she seen Dr. Caryl Comes back in 2015 he wanted to see her in a year. Patient said she would call back to schedule that appt.

## 2018-12-08 ENCOUNTER — Other Ambulatory Visit: Payer: Self-pay | Admitting: Psychiatry

## 2019-02-16 ENCOUNTER — Telehealth: Payer: Self-pay

## 2019-02-16 NOTE — Telephone Encounter (Signed)
Called patient from recall.  No answer. LMOV.  This is the 3rd attempt per recall list. Will delete recall.

## 2019-03-31 ENCOUNTER — Other Ambulatory Visit: Payer: Self-pay | Admitting: Psychiatry

## 2019-04-03 ENCOUNTER — Other Ambulatory Visit
Admission: RE | Admit: 2019-04-03 | Discharge: 2019-04-03 | Disposition: A | Discharge status: Home / Self Care | Payer: PPO | Source: Ambulatory Visit | Attending: Psychiatry | Admitting: Psychiatry

## 2019-04-03 ENCOUNTER — Encounter
Admission: RE | Admit: 2019-04-03 | Discharge: 2019-04-03 | Disposition: A | Discharge status: Home / Self Care | Payer: PPO | Source: Ambulatory Visit | Attending: Psychiatry | Admitting: Psychiatry

## 2019-04-03 ENCOUNTER — Other Ambulatory Visit: Payer: Self-pay

## 2019-04-03 ENCOUNTER — Encounter: Payer: Self-pay | Admitting: Anesthesiology

## 2019-04-03 DIAGNOSIS — I2510 Atherosclerotic heart disease of native coronary artery without angina pectoris: Secondary | ICD-10-CM | POA: Diagnosis not present

## 2019-04-03 DIAGNOSIS — Z1159 Encounter for screening for other viral diseases: Secondary | ICD-10-CM | POA: Insufficient documentation

## 2019-04-03 DIAGNOSIS — R001 Bradycardia, unspecified: Secondary | ICD-10-CM | POA: Diagnosis not present

## 2019-04-03 DIAGNOSIS — H409 Unspecified glaucoma: Secondary | ICD-10-CM | POA: Diagnosis not present

## 2019-04-03 DIAGNOSIS — F419 Anxiety disorder, unspecified: Secondary | ICD-10-CM | POA: Insufficient documentation

## 2019-04-03 DIAGNOSIS — Z950 Presence of cardiac pacemaker: Secondary | ICD-10-CM | POA: Insufficient documentation

## 2019-04-03 DIAGNOSIS — I10 Essential (primary) hypertension: Secondary | ICD-10-CM | POA: Insufficient documentation

## 2019-04-03 DIAGNOSIS — F332 Major depressive disorder, recurrent severe without psychotic features: Secondary | ICD-10-CM | POA: Diagnosis not present

## 2019-04-03 DIAGNOSIS — R413 Other amnesia: Secondary | ICD-10-CM | POA: Diagnosis not present

## 2019-04-03 DIAGNOSIS — F329 Major depressive disorder, single episode, unspecified: Secondary | ICD-10-CM | POA: Diagnosis not present

## 2019-04-03 DIAGNOSIS — M549 Dorsalgia, unspecified: Secondary | ICD-10-CM | POA: Diagnosis not present

## 2019-04-03 DIAGNOSIS — Z01812 Encounter for preprocedural laboratory examination: Secondary | ICD-10-CM | POA: Diagnosis not present

## 2019-04-03 DIAGNOSIS — E782 Mixed hyperlipidemia: Secondary | ICD-10-CM | POA: Insufficient documentation

## 2019-04-03 LAB — SARS CORONAVIRUS 2 BY RT PCR (HOSPITAL ORDER, PERFORMED IN ~~LOC~~ HOSPITAL LAB): SARS Coronavirus 2: NEGATIVE

## 2019-04-03 MED ORDER — METHOHEXITAL SODIUM 100 MG/10ML IV SOSY
PREFILLED_SYRINGE | INTRAVENOUS | Status: DC | PRN
  Administered 2019-04-03: 80 mg via INTRAVENOUS

## 2019-04-03 MED ORDER — SODIUM CHLORIDE 0.9 % IV SOLN
INTRAVENOUS | Status: DC | PRN
  Administered 2019-04-03: 09:00:00 via INTRAVENOUS

## 2019-04-03 MED ORDER — SUCCINYLCHOLINE CHLORIDE 200 MG/10ML IV SOSY
PREFILLED_SYRINGE | INTRAVENOUS | Status: DC | PRN
  Administered 2019-04-03: 90 mg via INTRAVENOUS

## 2019-04-03 MED ORDER — KETOROLAC TROMETHAMINE 30 MG/ML IJ SOLN
INTRAMUSCULAR | Status: AC
  Filled 2019-04-03: qty 1

## 2019-04-03 MED ORDER — KETOROLAC TROMETHAMINE 30 MG/ML IJ SOLN
15.0000 mg | Freq: Once | INTRAMUSCULAR | Status: AC
  Administered 2019-04-03: 15 mg via INTRAVENOUS

## 2019-04-03 MED ORDER — SODIUM CHLORIDE 0.9 % IV SOLN
500.0000 mL | Freq: Once | INTRAVENOUS | Status: AC
  Administered 2019-04-03: 500 mL via INTRAVENOUS

## 2019-04-03 MED ORDER — FENTANYL CITRATE (PF) 100 MCG/2ML IJ SOLN: 25.0000 ug | INTRAMUSCULAR | Status: DC | PRN

## 2019-04-03 MED ORDER — SUCCINYLCHOLINE CHLORIDE 20 MG/ML IJ SOLN
INTRAMUSCULAR | Status: AC
  Filled 2019-04-03: qty 1

## 2019-04-03 NOTE — Anesthesia Preprocedure Evaluation (Signed)
Anesthesia Evaluation  Patient identified by MRN, date of birth, ID band Patient awake    Reviewed: Allergy & Precautions, NPO status , Patient's Chart, lab work & pertinent test results, reviewed documented beta blocker date and time   Airway Mallampati: II  TM Distance: >3 FB     Dental  (+) Upper Dentures, Lower Dentures   Pulmonary           Cardiovascular hypertension, Pt. on medications + CAD  + dysrhythmias      Neuro/Psych PSYCHIATRIC DISORDERS Anxiety Depression    GI/Hepatic   Endo/Other    Renal/GU Renal disease     Musculoskeletal  (+) Arthritis ,   Abdominal   Peds  Hematology  (+) anemia ,   Anesthesia Other Findings Pacemaker has been dead for many yrs. Cardiologist decided not too take it out - according to pt. EKG shows old Lbbb. Hx of PVCs.  Reproductive/Obstetrics                             Anesthesia Physical Anesthesia Plan  ASA: III  Anesthesia Plan: General   Post-op Pain Management:    Induction: Intravenous  PONV Risk Score and Plan:   Airway Management Planned:   Additional Equipment:   Intra-op Plan:   Post-operative Plan:   Informed Consent: I have reviewed the patients History and Physical, chart, labs and discussed the procedure including the risks, benefits and alternatives for the proposed anesthesia with the patient or authorized representative who has indicated his/her understanding and acceptance.       Plan Discussed with: CRNA  Anesthesia Plan Comments:         Anesthesia Quick Evaluation

## 2019-04-03 NOTE — Discharge Instructions (Signed)
1)  The drugs that you have been given will stay in your system until tomorrow so for the       next 24 hours you should not:  A. Drive an automobile  B. Make any legal decisions  C. Drink any alcoholic beverages  2)  You may resume your regular meals upon return home.  3)  A responsible adult must take you home.  Someone should stay with you for a few          hours, then be available by phone for the remainder of the treatment day.  4)  You May experience any of the following symptoms:  Headache, Nausea and a dry mouth (due to the medications you were given),  temporary memory loss and some confusion, or sore muscles (a warm bath  should help this).  If you you experience any of these symptoms let us know on                your return visit.  5)  Report any of the following: any acute discomfort, severe headache, or temperature        greater than 100.5 F.   Also report any unusual redness, swelling, drainage, or pain         at your IV site.    You may report Symptoms to:  [place] [facility]          Phone: [phone], ECT Department           or Dr. Prescott Gum office [phone]  6)  Your next ECT Treatment is Wednesday at 8:30   We will call 2 days prior to your scheduled appointment for arrival times.  7)  Nothing to eat or drink after midnight the night before your procedure.  8)  Take   With a sip of water the morning of your procedure.  9)  Other Instructions: Call [phone] to cancel the morning of your procedure due         to illness or emergency.  10) We will call within 72 hours to assess how you are feeling.

## 2019-04-03 NOTE — Anesthesia Post-op Follow-up Note (Signed)
Anesthesia QCDR form completed.        

## 2019-04-03 NOTE — Transfer of Care (Signed)
Immediate Anesthesia Transfer of Care Note  Patient: Barbara Marks  Procedure(s) Performed: ECT TX  Patient Location: PACU  Anesthesia Type:General  Level of Consciousness: sedated  Airway & Oxygen Therapy: Patient Spontanous Breathing and Patient connected to face mask oxygen  Post-op Assessment: Report given to RN and Post -op Vital signs reviewed and stable  Post vital signs: Reviewed and stable  Last Vitals:  Vitals Value Taken Time  BP 164/76 04/03/19 1115  Temp 36.6 C 04/03/19 1115  Pulse 79 04/03/19 1119  Resp 25 04/03/19 1119  SpO2 100 % 04/03/19 1119  Vitals shown include unvalidated device data.  Last Pain:  Vitals:   04/03/19 1115  TempSrc:   PainSc: 0-No pain         Complications: No apparent anesthesia complications

## 2019-04-03 NOTE — H&P (Signed)
Barbara Marks is an 85 y.o. female.   Chief Complaint: Return of depression with anorexia weight loss anhedonia and fatigue HPI: History of chronic recurrent depression with good response to ECT  Past Medical History:  Diagnosis Date  . Anxiety   . CAD (coronary artery disease) Non-obstructive   a. 2004 Cath: nonobs dzs.  . DDD (degenerative disc disease)    a. with chronic right sided back pain - improves after seeing chiropractor.  . Depression    a. h/o ECT  . Diverticulitis   . Glaucoma   . HTN (hypertension)   . Hyperlipidemia, mixed   . Melanoma of skin ([place]) 2008   resected from Left arm   . Memory problem    "states memory issues"  . Nephrolithiasis   . Pancreatic cyst    a. 18299 Endoscopic U/S: nl UGI tract, 1-40mm pancreatic cysts, no masses/nodules.  . Premature ventricular contraction    a. managed with propafenone.  . Presence of permanent cardiac pacemaker   . Symptomatic bradycardia    a. s/p MDT PPM in 08/2005;  b. 02/2010 Gen change->MDT Adapta DC PPM, ser # [id number] H.    Past Surgical History:  Procedure Laterality Date  . APPENDECTOMY  1952  . CATARACT EXTRACTION, BILATERAL    . CHOLECYSTECTOMY  1983  . EUS N/A 06/22/2013   Procedure: UPPER ENDOSCOPIC ULTRASOUND (EUS) LINEAR;  Surgeon: Milus Banister, MD;  Location: [facility] ENDOSCOPY;  Service: Endoscopy;  Laterality: N/A;  . EYE SURGERY Bilateral    Cataract Extraction with IOL  . PACEMAKER INSERTION Left    [employer]  . TUMOR EXCISION     And nemamgeomas    Family History  Problem Relation Age of Onset  . Other Mother        died @ 61 - old age.  . Stroke Father        cva after cea in his 78's, died @ 34.  . Kidney cancer Brother    Social History:  reports that she has never smoked. She has never used smokeless tobacco. She reports that she does not drink alcohol or use drugs.  Allergies:  Allergies  Allergen Reactions  . Codeine     unknown  . Simvastatin Other (See Comments)    "leg  cramps"    (Not in a hospital admission)   Results for orders placed or performed during the hospital encounter of 04/03/19 (from the past 48 hour(s))  SARS Coronavirus 2 (CEPHEID - Performed in [place] hospital lab), Hosp Order     Status: None   Collection Time: 04/03/19  7:57 AM   Specimen: Nasopharyngeal Swab  Result Value Ref Range   SARS Coronavirus 2 NEGATIVE NEGATIVE    Comment: (NOTE) If result is NEGATIVE SARS-CoV-2 target nucleic acids are NOT DETECTED. The SARS-CoV-2 RNA is generally detectable in upper and lower  respiratory specimens during the acute phase of infection. The lowest  concentration of SARS-CoV-2 viral copies this assay can detect is 250  copies / mL. A negative result does not preclude SARS-CoV-2 infection  and should not be used as the sole basis for treatment or other  patient management decisions.  A negative result may occur with  improper specimen collection / handling, submission of specimen other  than nasopharyngeal swab, presence of viral mutation(s) within the  areas targeted by this assay, and inadequate number of viral copies  (<250 copies / mL). A negative result must be combined with clinical  observations, patient history,  and epidemiological information. If result is POSITIVE SARS-CoV-2 target nucleic acids are DETECTED. The SARS-CoV-2 RNA is generally detectable in upper and lower  respiratory specimens dur ing the acute phase of infection.  Positive  results are indicative of active infection with SARS-CoV-2.  Clinical  correlation with patient history and other diagnostic information is  necessary to determine patient infection status.  Positive results do  not rule out bacterial infection or co-infection with other viruses. If result is PRESUMPTIVE POSTIVE SARS-CoV-2 nucleic acids MAY BE PRESENT.   A presumptive positive result was obtained on the submitted specimen  and confirmed on repeat testing.  While 2019 novel  coronavirus  (SARS-CoV-2) nucleic acids may be present in the submitted sample  additional confirmatory testing may be necessary for epidemiological  and / or clinical management purposes  to differentiate between  SARS-CoV-2 and other Sarbecovirus currently known to infect humans.  If clinically indicated additional testing with an alternate test  methodology [phone]) is advised. The SARS-CoV-2 RNA is generally  detectable in upper and lower respiratory sp ecimens during the acute  phase of infection. The expected result is Negative. Fact Sheet for Patients:  StrictlyIdeas.no Fact Sheet for Healthcare Providers: BankingDealers.co.za This test is not yet approved or cleared by the [place] FDA and has been authorized for detection and/or diagnosis of SARS-CoV-2 by FDA under an Emergency Use Authorization (EUA).  This EUA will remain in effect (meaning this test can be used) for the duration of the COVID-19 declaration under Section 564(b)(1) of the Act, 21 U.S.C. section 360bbb-3(b)(1), unless the authorization is terminated or revoked sooner. Performed at [facility], [address] [place], [place] [address]    No results found.  Review of Systems  Constitutional: Positive for malaise/fatigue.  HENT: Negative.   Eyes: Negative.   Respiratory: Negative.   Cardiovascular: Negative.   Gastrointestinal: Negative.   Musculoskeletal: Negative.   Skin: Negative.   Neurological: Negative.   Psychiatric/Behavioral: Positive for depression. Negative for hallucinations, memory loss, substance abuse and suicidal ideas. The patient has insomnia. The patient is not nervous/anxious.     Blood pressure (!) 169/93, pulse 86, temperature 98.2 F (36.8 C), temperature source Oral, height 5\' 6"  (1.676 m), weight 64 kg, SpO2 100 %. Physical Exam  Nursing note and vitals reviewed. Constitutional: She appears well-developed and  well-nourished.  HENT:  Head: Normocephalic and atraumatic.  Eyes: Pupils are equal, round, and reactive to light. Conjunctivae are normal.  Neck: Normal range of motion.  Cardiovascular: Regular rhythm and normal heart sounds.  Respiratory: Effort normal.  GI: Soft.  Musculoskeletal: Normal range of motion.  Neurological: She is alert.  Skin: Skin is warm and dry.  Psychiatric: Judgment normal. Her mood appears anxious. Her speech is delayed. She is slowed. Thought content is not paranoid. Cognition and memory are normal. She exhibits a depressed mood. She expresses no homicidal and no suicidal ideation.     Assessment/Plan ECT today and tentatively scheduled for Wednesday as well  [name], MD 04/03/2019, 10:56 AM

## 2019-04-03 NOTE — Anesthesia Procedure Notes (Signed)
Performed by: Victoriah Wilds, CRNA Pre-anesthesia Checklist: Patient identified, Emergency Drugs available, Suction available and Patient being monitored Patient Re-evaluated:Patient Re-evaluated prior to induction Oxygen Delivery Method: Circle system utilized Preoxygenation: Pre-oxygenation with 100% oxygen Induction Type: IV induction Ventilation: Mask ventilation without difficulty and Mask ventilation throughout procedure Airway Equipment and Method: Bite block Placement Confirmation: positive ETCO2 Dental Injury: Teeth and Oropharynx as per pre-operative assessment        

## 2019-04-03 NOTE — Anesthesia Postprocedure Evaluation (Signed)
Anesthesia Post Note  Patient: Barbara Marks  Procedure(s) Performed: ECT TX  Patient location during evaluation: PACU Anesthesia Type: General Level of consciousness: awake and alert Pain management: pain level controlled Vital Signs Assessment: post-procedure vital signs reviewed and stable Respiratory status: spontaneous breathing, nonlabored ventilation, respiratory function stable and patient connected to nasal cannula oxygen Cardiovascular status: blood pressure returned to baseline and stable Postop Assessment: no apparent nausea or vomiting Anesthetic complications: no     Last Vitals:  Vitals:   04/03/19 1135 04/03/19 1148  BP: (!) 163/88 (!) 163/80  Pulse: 86 86  Resp: (!) 22 16  Temp: 36.7 C   SpO2: 100%     Last Pain:  Vitals:   04/03/19 1148  TempSrc:   PainSc: 0-No pain                 Teara Duerksen S

## 2019-04-03 NOTE — Procedures (Signed)
ECT SERVICES Physician's Interval Evaluation & Treatment Note  Patient Identification: Barbara Marks MRN:  [id number] Date of Evaluation:  04/03/2019 TX #: 12  MADRS: 27 MMSE: 29  P.E. Findings:  No change to physical exam  Psychiatric Interval Note:  Flat blunted depressed not suicidal  Subjective:  Patient is a 85 y.o. female seen for evaluation for Electroconvulsive Therapy. Feeling more depressed no appetite  Treatment Summary:   []   Right Unilateral             [x]  Bilateral   % Energy : 1.0 ms 75%   Impedance: 2060 ohms  Seizure Energy Index: 4338 V squared  Postictal Suppression Index: 59%  Seizure Concordance Index: 93%  Medications  Pre Shock: Toradol 30 mg Brevital 80 mg succinylcholine 90 mg  Post Shock:    Seizure Duration: 42 seconds by EMG 72 seconds EEG   Comments: Tentatively schedule for Wednesday.  Lungs:  [x]   Clear to auscultation               []  Other:   Heart:    [x]   Regular rhythm             []  irregular rhythm    [x]   Previous H&P reviewed, patient examined and there are NO CHANGES                 []   Previous H&P reviewed, patient examined and there are changes noted.   [name], MD 6/29/202010:59 AM

## 2019-04-04 ENCOUNTER — Other Ambulatory Visit: Payer: Self-pay | Admitting: Psychiatry

## 2019-04-26 ENCOUNTER — Other Ambulatory Visit: Payer: Self-pay | Admitting: Psychiatry

## 2019-05-01 ENCOUNTER — Other Ambulatory Visit: Payer: Self-pay | Admitting: Psychiatry

## 2019-05-03 ENCOUNTER — Other Ambulatory Visit: Payer: Self-pay

## 2019-07-12 ENCOUNTER — Telehealth: Payer: Self-pay

## 2019-07-13 ENCOUNTER — Other Ambulatory Visit: Payer: Self-pay | Admitting: Psychiatry

## 2019-07-14 ENCOUNTER — Other Ambulatory Visit: Payer: Self-pay | Admitting: Psychiatry

## 2019-07-14 ENCOUNTER — Encounter: Payer: Self-pay | Admitting: Anesthesiology

## 2019-07-14 ENCOUNTER — Telehealth: Payer: Self-pay

## 2019-07-14 ENCOUNTER — Other Ambulatory Visit: Admission: RE | Admit: 2019-07-14 | Payer: PPO | Source: Ambulatory Visit

## 2019-07-14 ENCOUNTER — Inpatient Hospital Stay: Admission: RE | Admit: 2019-07-14 | Payer: PPO | Source: Ambulatory Visit

## 2019-07-17 ENCOUNTER — Other Ambulatory Visit: Payer: Self-pay

## 2019-07-17 ENCOUNTER — Encounter
Admission: RE | Admit: 2019-07-17 | Discharge: 2019-07-17 | Disposition: A | Discharge status: Home / Self Care | Payer: PPO | Source: Ambulatory Visit | Attending: Psychiatry | Admitting: Psychiatry

## 2019-07-17 ENCOUNTER — Encounter: Payer: Self-pay | Admitting: Anesthesiology

## 2019-07-17 ENCOUNTER — Other Ambulatory Visit
Admission: RE | Admit: 2019-07-17 | Discharge: 2019-07-17 | Disposition: A | Discharge status: Home / Self Care | Payer: PPO | Source: Ambulatory Visit | Attending: Psychiatry | Admitting: Psychiatry

## 2019-07-17 DIAGNOSIS — Z01812 Encounter for preprocedural laboratory examination: Secondary | ICD-10-CM | POA: Diagnosis not present

## 2019-07-17 DIAGNOSIS — F339 Major depressive disorder, recurrent, unspecified: Secondary | ICD-10-CM | POA: Insufficient documentation

## 2019-07-17 DIAGNOSIS — Z950 Presence of cardiac pacemaker: Secondary | ICD-10-CM | POA: Diagnosis not present

## 2019-07-17 DIAGNOSIS — F332 Major depressive disorder, recurrent severe without psychotic features: Secondary | ICD-10-CM | POA: Diagnosis not present

## 2019-07-17 DIAGNOSIS — Z20828 Contact with and (suspected) exposure to other viral communicable diseases: Secondary | ICD-10-CM | POA: Diagnosis not present

## 2019-07-17 DIAGNOSIS — Z85820 Personal history of malignant melanoma of skin: Secondary | ICD-10-CM | POA: Insufficient documentation

## 2019-07-17 DIAGNOSIS — E785 Hyperlipidemia, unspecified: Secondary | ICD-10-CM | POA: Diagnosis not present

## 2019-07-17 DIAGNOSIS — D649 Anemia, unspecified: Secondary | ICD-10-CM | POA: Diagnosis not present

## 2019-07-17 DIAGNOSIS — I10 Essential (primary) hypertension: Secondary | ICD-10-CM | POA: Insufficient documentation

## 2019-07-17 DIAGNOSIS — I2510 Atherosclerotic heart disease of native coronary artery without angina pectoris: Secondary | ICD-10-CM | POA: Insufficient documentation

## 2019-07-17 DIAGNOSIS — R001 Bradycardia, unspecified: Secondary | ICD-10-CM | POA: Diagnosis not present

## 2019-07-17 DIAGNOSIS — E876 Hypokalemia: Secondary | ICD-10-CM | POA: Diagnosis not present

## 2019-07-17 DIAGNOSIS — E782 Mixed hyperlipidemia: Secondary | ICD-10-CM | POA: Insufficient documentation

## 2019-07-17 DIAGNOSIS — Z885 Allergy status to narcotic agent status: Secondary | ICD-10-CM | POA: Diagnosis not present

## 2019-07-17 LAB — SARS CORONAVIRUS 2 BY RT PCR (HOSPITAL ORDER, PERFORMED IN ~~LOC~~ HOSPITAL LAB): SARS Coronavirus 2: NEGATIVE

## 2019-07-17 MED ORDER — SUCCINYLCHOLINE CHLORIDE 200 MG/10ML IV SOSY
PREFILLED_SYRINGE | INTRAVENOUS | Status: DC | PRN
  Administered 2019-07-17: 90 mg via INTRAVENOUS

## 2019-07-17 MED ORDER — METHOHEXITAL SODIUM 100 MG/10ML IV SOSY
PREFILLED_SYRINGE | INTRAVENOUS | Status: DC | PRN
  Administered 2019-07-17: 80 mg via INTRAVENOUS

## 2019-07-17 MED ORDER — SODIUM CHLORIDE 0.9 % IV SOLN
500.0000 mL | Freq: Once | INTRAVENOUS | Status: AC
  Administered 2019-07-17: 500 mL via INTRAVENOUS

## 2019-07-17 MED ORDER — SUCCINYLCHOLINE CHLORIDE 20 MG/ML IJ SOLN
INTRAMUSCULAR | Status: AC
  Filled 2019-07-17: qty 1

## 2019-07-17 MED ORDER — FENTANYL CITRATE (PF) 100 MCG/2ML IJ SOLN: 25.0000 ug | INTRAMUSCULAR | Status: DC | PRN

## 2019-07-17 MED ORDER — SODIUM CHLORIDE 0.9 % IV SOLN
INTRAVENOUS | Status: DC | PRN
  Administered 2019-07-17: 10:00:00 via INTRAVENOUS

## 2019-07-17 NOTE — H&P (Signed)
Barbara Marks is an 85 y.o. female.   Chief Complaint: return of depression and low energy  HPI: recurrent depression  Past Medical History:  Diagnosis Date  . Anxiety   . CAD (coronary artery disease) Non-obstructive   a. 2004 Cath: nonobs dzs.  . DDD (degenerative disc disease)    a. with chronic right sided back pain - improves after seeing chiropractor.  . Depression    a. h/o ECT  . Diverticulitis   . Glaucoma   . HTN (hypertension)   . Hyperlipidemia, mixed   . Melanoma of skin ([place]) 2008   resected from Left arm   . Memory problem    "states memory issues"  . Nephrolithiasis   . Pancreatic cyst    a. [id number] Endoscopic U/S: nl UGI tract, 1-12mm pancreatic cysts, no masses/nodules.  . Premature ventricular contraction    a. managed with propafenone.  . Presence of permanent cardiac pacemaker   . Symptomatic bradycardia    a. s/p MDT PPM in 08/2005;  b. 02/2010 Gen change->MDT Adapta DC PPM, ser # [id number].    Past Surgical History:  Procedure Laterality Date  . APPENDECTOMY  1952  . CATARACT EXTRACTION, BILATERAL    . CHOLECYSTECTOMY  1983  . EUS N/A 06/22/2013   Procedure: UPPER ENDOSCOPIC ULTRASOUND (EUS) LINEAR;  Surgeon: Milus Banister, MD;  Location: [facility];  Service: Endoscopy;  Laterality: N/A;  . EYE SURGERY Bilateral    Cataract Extraction with IOL  . PACEMAKER INSERTION Left    [employer]  . TUMOR EXCISION     And nemamgeomas    Family History  Problem Relation Age of Onset  . Other Mother        died @ 64 - old age.  . Stroke Father        cva after cea in his 29's, died @ 8.  . Kidney cancer Brother    Social History:  reports that she has never smoked. She has never used smokeless tobacco. She reports that she does not drink alcohol or use drugs.  Allergies:  Allergies  Allergen Reactions  . Codeine     unknown  . Simvastatin Other (See Comments)    "leg cramps"    (Not in a hospital admission)   Results for orders placed or  performed during the hospital encounter of 07/17/19 (from the past 48 hour(s))  SARS Coronavirus 2 by RT PCR (hospital order, performed in [facility] hospital lab) Nasopharyngeal Nasopharyngeal Swab     Status: None   Collection Time: 07/17/19  8:00 AM   Specimen: Nasopharyngeal Swab  Result Value Ref Range   SARS Coronavirus 2 NEGATIVE NEGATIVE    Comment: (NOTE) If result is NEGATIVE SARS-CoV-2 target nucleic acids are NOT DETECTED. The SARS-CoV-2 RNA is generally detectable in upper and lower  respiratory specimens during the acute phase of infection. The lowest  concentration of SARS-CoV-2 viral copies this assay can detect is 250  copies / mL. A negative result does not preclude SARS-CoV-2 infection  and should not be used as the sole basis for treatment or other  patient management decisions.  A negative result may occur with  improper specimen collection / handling, submission of specimen other  than nasopharyngeal swab, presence of viral mutation(s) within the  areas targeted by this assay, and inadequate number of viral copies  (<250 copies / mL). A negative result must be combined with clinical  observations, patient history, and epidemiological information. If result is POSITIVE  SARS-CoV-2 target nucleic acids are DETECTED. The SARS-CoV-2 RNA is generally detectable in upper and lower  respiratory specimens dur ing the acute phase of infection.  Positive  results are indicative of active infection with SARS-CoV-2.  Clinical  correlation with patient history and other diagnostic information is  necessary to determine patient infection status.  Positive results do  not rule out bacterial infection or co-infection with other viruses. If result is PRESUMPTIVE POSTIVE SARS-CoV-2 nucleic acids MAY BE PRESENT.   A presumptive positive result was obtained on the submitted specimen  and confirmed on repeat testing.  While 2019 novel coronavirus  (SARS-CoV-2) nucleic acids may be  present in the submitted sample  additional confirmatory testing may be necessary for epidemiological  and / or clinical management purposes  to differentiate between  SARS-CoV-2 and other Sarbecovirus currently known to infect humans.  If clinically indicated additional testing with an alternate test  methodology [phone]) is advised. The SARS-CoV-2 RNA is generally  detectable in upper and lower respiratory sp ecimens during the acute  phase of infection. The expected result is Negative. Fact Sheet for Patients:  StrictlyIdeas.no Fact Sheet for Healthcare Providers: BankingDealers.co.za This test is not yet approved or cleared by the [place] FDA and has been authorized for detection and/or diagnosis of SARS-CoV-2 by FDA under an Emergency Use Authorization (EUA).  This EUA will remain in effect (meaning this test can be used) for the duration of the COVID-19 declaration under Section 564(b)(1) of the Act, 21 U.S.C. section 360bbb-3(b)(1), unless the authorization is terminated or revoked sooner. Performed at [facility], [address] [place], [place] [address]    No results found.  Review of Systems  Constitutional: Negative.   HENT: Negative.   Eyes: Negative.   Respiratory: Negative.   Cardiovascular: Negative.   Gastrointestinal: Negative.   Musculoskeletal: Negative.   Skin: Negative.   Neurological: Negative.   Psychiatric/Behavioral: Positive for depression. Negative for suicidal ideas.    Blood pressure (!) 153/80, pulse 79, temperature 98.2 F (36.8 C), temperature source Oral, resp. rate 18, height 5\' 6"  (1.676 m), weight 68 kg, SpO2 98 %. Physical Exam  Nursing note and vitals reviewed. Constitutional: She appears well-developed and well-nourished.  HENT:  Head: Normocephalic and atraumatic.  Eyes: Pupils are equal, round, and reactive to light. Conjunctivae are normal.  Neck: Normal range of  motion.  Cardiovascular: Regular rhythm and normal heart sounds.  Respiratory: Effort normal.  GI: Soft.  Musculoskeletal: Normal range of motion.  Neurological: She is alert.  Skin: Skin is warm and dry.  Psychiatric: Her behavior is normal. Judgment and thought content normal. Her affect is blunt. Her speech is delayed. Cognition and memory are normal.     Assessment/Plan ect today and reassess improvement  [name], MD 07/17/2019, 9:57 AM

## 2019-07-17 NOTE — Procedures (Signed)
ECT SERVICES Physician's Interval Evaluation & Treatment Note  Patient Identification: Barbara Marks MRN:  [id number] Date of Evaluation:  07/17/2019 TX #: 13  MADRS:   MMSE:   P.E. Findings:  No change to physical exam  Psychiatric Interval Note:  Feeling depressed and low energy not motivated  Subjective:  Patient is a 85 y.o. female seen for evaluation for Electroconvulsive Therapy. Depressed mood no suicidal ideation no psychosis  Treatment Summary:   []   Right Unilateral             [x]  Bilateral   % Energy : 1.0 ms 75%   Impedance: 2430 ohms  Seizure Energy Index: 7723 Mike  Postictal Suppression Index: 52% postictal Suppression Index: 752% squared squared Seizure Concordance Index: 78%  Medications  Pre Shock: Toradol 30 mg Brevital 80 mg succinylcholine 90 mg  Post Shock: None  Seizure Duration: 44 seconds EMG 84 seconds EEG   Comments: Follow-up as needed  Lungs:  [x]   Clear to auscultation               []  Other:   Heart:    [x]   Regular rhythm             []  irregular rhythm    [x]   Previous H&P reviewed, patient examined and there are NO CHANGES                 []   Previous H&P reviewed, patient examined and there are changes noted.   [name], MD 10/12/202010:24 AM

## 2019-07-17 NOTE — Anesthesia Preprocedure Evaluation (Signed)
Anesthesia Evaluation  Patient identified by MRN, date of birth, ID band Patient awake    Reviewed: Allergy & Precautions, NPO status , Patient's Chart, lab work & pertinent test results, reviewed documented beta blocker date and time   Airway Mallampati: II  TM Distance: >3 FB     Dental  (+) Upper Dentures, Lower Dentures   Pulmonary           Cardiovascular hypertension, Pt. on medications + CAD  + dysrhythmias + pacemaker      Neuro/Psych PSYCHIATRIC DISORDERS Anxiety Depression    GI/Hepatic   Endo/Other    Renal/GU Renal disease     Musculoskeletal  (+) Arthritis ,   Abdominal   Peds  Hematology  (+) anemia ,   Anesthesia Other Findings Pacemaker has been dead for many yrs. Cardiologist decided not too take it out - according to pt. EKG shows old Lbbb. Hx of PVCs.  Reproductive/Obstetrics                             Anesthesia Physical  Anesthesia Plan  ASA: III  Anesthesia Plan: General   Post-op Pain Management:    Induction: Intravenous  PONV Risk Score and Plan:   Airway Management Planned:   Additional Equipment:   Intra-op Plan:   Post-operative Plan:   Informed Consent: I have reviewed the patients History and Physical, chart, labs and discussed the procedure including the risks, benefits and alternatives for the proposed anesthesia with the patient or authorized representative who has indicated his/her understanding and acceptance.       Plan Discussed with: CRNA  Anesthesia Plan Comments:         Anesthesia Quick Evaluation

## 2019-07-17 NOTE — Anesthesia Post-op Follow-up Note (Signed)
Anesthesia QCDR form completed.        

## 2019-07-17 NOTE — Anesthesia Procedure Notes (Signed)
Date/Time: 07/17/2019 10:34 AM Performed by: Dionne Bucy, CRNA Pre-anesthesia Checklist: Patient identified, Emergency Drugs available, Suction available and Patient being monitored Patient Re-evaluated:Patient Re-evaluated prior to induction Oxygen Delivery Method: Circle system utilized Preoxygenation: Pre-oxygenation with 100% oxygen Induction Type: IV induction Ventilation: Mask ventilation without difficulty and Mask ventilation throughout procedure Airway Equipment and Method: Bite block Placement Confirmation: positive ETCO2 Dental Injury: Teeth and Oropharynx as per pre-operative assessment

## 2019-07-17 NOTE — Transfer of Care (Signed)
Immediate Anesthesia Transfer of Care Note  Patient: SKARLET TROCHEZ  Procedure(s) Performed: ECT TX  Patient Location: PACU  Anesthesia Type:General  Level of Consciousness: sedated  Airway & Oxygen Therapy: Patient Spontanous Breathing and Patient connected to face mask oxygen  Post-op Assessment: Report given to RN and Post -op Vital signs reviewed and stable  Post vital signs: Reviewed and stable  Last Vitals:  Vitals Value Taken Time  BP 166/77 07/17/19 1046  Temp 36.4 C 07/17/19 1046  Pulse 81 07/17/19 1049  Resp 31 07/17/19 1049  SpO2 100 % 07/17/19 1049  Vitals shown include unvalidated device data.  Last Pain:  Vitals:   07/17/19 1046  TempSrc:   PainSc: 0-No pain         Complications: No apparent anesthesia complications

## 2019-07-17 NOTE — Discharge Instructions (Signed)
1)  The drugs that you have been given will stay in your system until tomorrow so for the       next 24 hours you should not:  A. Drive an automobile  B. Make any legal decisions  C. Drink any alcoholic beverages  2)  You may resume your regular meals upon return home.  3)  A responsible adult must take you home.  Someone should stay with you for a few          hours, then be available by phone for the remainder of the treatment day.  4)  You May experience any of the following symptoms:  Headache, Nausea and a dry mouth (due to the medications you were given),  temporary memory loss and some confusion, or sore muscles (a warm bath  should help this).  If you you experience any of these symptoms let us know on                your return visit.  5)  Report any of the following: any acute discomfort, severe headache, or temperature        greater than 100.5 F.   Also report any unusual redness, swelling, drainage, or pain         at your IV site.    You may report Symptoms to:  [place] at [facility]          Phone: [phone], ECT Department           or Dr. Prescott Gum office [phone]  6)  Your next ECT Treatment is Wednesday October 21  We will call 2 days prior to your scheduled appointment for arrival times.  7)  Nothing to eat or drink after midnight the night before your procedure.  8)  Take     With a sip of water the morning of your procedure.  9)  Other Instructions: Call [phone] to cancel the morning of your procedure due         to illness or emergency.  10) We will call within 72 hours to assess how you are feeling.

## 2019-07-18 ENCOUNTER — Other Ambulatory Visit: Payer: Self-pay | Admitting: Primary Care

## 2019-07-18 DIAGNOSIS — D649 Anemia, unspecified: Secondary | ICD-10-CM

## 2019-07-18 DIAGNOSIS — E782 Mixed hyperlipidemia: Secondary | ICD-10-CM

## 2019-07-18 DIAGNOSIS — I10 Essential (primary) hypertension: Secondary | ICD-10-CM

## 2019-07-18 NOTE — Anesthesia Postprocedure Evaluation (Signed)
Anesthesia Post Note  Patient: Barbara Marks  Procedure(s) Performed: ECT TX  Patient location during evaluation: PACU Anesthesia Type: General Level of consciousness: awake and alert and oriented Pain management: pain level controlled Vital Signs Assessment: post-procedure vital signs reviewed and stable Respiratory status: spontaneous breathing Cardiovascular status: blood pressure returned to baseline Anesthetic complications: no     Last Vitals:  Vitals:   07/17/19 1056 07/17/19 1106  BP: (!) 147/75   Pulse: 86   Resp: (!) 21   Temp:  37.2 C  SpO2: 97%     Last Pain:  Vitals:   07/17/19 1106  TempSrc:   PainSc: 0-No pain                 Alizae Bechtel

## 2019-07-24 ENCOUNTER — Telehealth: Payer: Self-pay

## 2019-07-24 NOTE — Telephone Encounter (Signed)
[facility] Night - Client Nonclinical Telephone Record AccessNurse Client [name] - Client Client Site [place] Physician Waunita Schooner- MD Contact Type Call Who Is Calling Patient / Member / Family / Caregiver Caller Name Lamar Phone Number [phone] Patient Name Barbara Marks Patient DOB 1933-10-26 Call Type Message Only Information Provided Reason for Call Request to [facility] Appointment Initial Comment Caller wants to cancel her appt on 10/21 at 10:15am Additional Comment Call Closed By: Sharlet Salina Transaction Date/Time: 07/21/2019 6:08:52 PM (ET)

## 2019-07-25 ENCOUNTER — Other Ambulatory Visit
Admission: RE | Admit: 2019-07-25 | Discharge: 2019-07-25 | Disposition: A | Discharge status: Home / Self Care | Payer: PPO | Source: Ambulatory Visit | Attending: Psychiatry | Admitting: Psychiatry

## 2019-07-25 ENCOUNTER — Other Ambulatory Visit: Payer: Self-pay | Admitting: Psychiatry

## 2019-07-25 ENCOUNTER — Other Ambulatory Visit: Payer: Self-pay

## 2019-07-25 DIAGNOSIS — Z01812 Encounter for preprocedural laboratory examination: Secondary | ICD-10-CM | POA: Diagnosis not present

## 2019-07-25 DIAGNOSIS — Z20828 Contact with and (suspected) exposure to other viral communicable diseases: Secondary | ICD-10-CM | POA: Insufficient documentation

## 2019-07-25 LAB — SARS CORONAVIRUS 2 (TAT 6-24 HRS): SARS Coronavirus 2: NEGATIVE

## 2019-07-26 ENCOUNTER — Encounter (HOSPITAL_BASED_OUTPATIENT_CLINIC_OR_DEPARTMENT_OTHER)
Admission: RE | Admit: 2019-07-26 | Discharge: 2019-07-26 | Disposition: A | Discharge status: Home / Self Care | Payer: PPO | Source: Ambulatory Visit | Attending: Psychiatry | Admitting: Psychiatry

## 2019-07-26 ENCOUNTER — Ambulatory Visit: Payer: PPO

## 2019-07-26 ENCOUNTER — Telehealth: Payer: Self-pay

## 2019-07-26 ENCOUNTER — Encounter: Payer: Self-pay | Admitting: Anesthesiology

## 2019-07-26 DIAGNOSIS — F418 Other specified anxiety disorders: Secondary | ICD-10-CM | POA: Diagnosis not present

## 2019-07-26 DIAGNOSIS — E876 Hypokalemia: Secondary | ICD-10-CM | POA: Diagnosis not present

## 2019-07-26 DIAGNOSIS — F339 Major depressive disorder, recurrent, unspecified: Secondary | ICD-10-CM | POA: Diagnosis not present

## 2019-07-26 DIAGNOSIS — F332 Major depressive disorder, recurrent severe without psychotic features: Secondary | ICD-10-CM

## 2019-07-26 DIAGNOSIS — E782 Mixed hyperlipidemia: Secondary | ICD-10-CM | POA: Diagnosis not present

## 2019-07-26 DIAGNOSIS — I2510 Atherosclerotic heart disease of native coronary artery without angina pectoris: Secondary | ICD-10-CM | POA: Diagnosis not present

## 2019-07-26 DIAGNOSIS — I10 Essential (primary) hypertension: Secondary | ICD-10-CM | POA: Diagnosis not present

## 2019-07-26 MED ORDER — SUCCINYLCHOLINE CHLORIDE 200 MG/10ML IV SOSY
PREFILLED_SYRINGE | INTRAVENOUS | Status: DC | PRN
  Administered 2019-07-26: 90 mg via INTRAVENOUS

## 2019-07-26 MED ORDER — GLYCOPYRROLATE 0.2 MG/ML IJ SOLN: 0.2000 mg | Freq: Once | INTRAMUSCULAR | Status: DC

## 2019-07-26 MED ORDER — FENTANYL CITRATE (PF) 100 MCG/2ML IJ SOLN: 25.0000 ug | INTRAMUSCULAR | Status: DC | PRN

## 2019-07-26 MED ORDER — GLYCOPYRROLATE 0.2 MG/ML IJ SOLN
INTRAMUSCULAR | Status: AC
  Filled 2019-07-26: qty 1

## 2019-07-26 MED ORDER — METHOHEXITAL SODIUM 100 MG/10ML IV SOSY
PREFILLED_SYRINGE | INTRAVENOUS | Status: DC | PRN
  Administered 2019-07-26: 80 mg via INTRAVENOUS

## 2019-07-26 MED ORDER — SODIUM CHLORIDE 0.9 % IV SOLN
500.0000 mL | Freq: Once | INTRAVENOUS | Status: AC
  Administered 2019-07-26: 500 mL via INTRAVENOUS

## 2019-07-26 MED ORDER — KETOROLAC TROMETHAMINE 30 MG/ML IJ SOLN
30.0000 mg | Freq: Once | INTRAMUSCULAR | Status: AC
  Administered 2019-07-26: 30 mg via INTRAVENOUS

## 2019-07-26 MED ORDER — SUCCINYLCHOLINE CHLORIDE 20 MG/ML IJ SOLN
INTRAMUSCULAR | Status: AC
  Filled 2019-07-26: qty 1

## 2019-07-26 MED ORDER — SODIUM CHLORIDE 0.9 % IV SOLN
INTRAVENOUS | Status: DC | PRN
  Administered 2019-07-26: 11:00:00 via INTRAVENOUS

## 2019-07-26 MED ORDER — KETOROLAC TROMETHAMINE 30 MG/ML IJ SOLN
INTRAMUSCULAR | Status: AC
  Filled 2019-07-26: qty 1

## 2019-07-26 MED ORDER — ONDANSETRON HCL 4 MG/2ML IJ SOLN: 4.0000 mg | Freq: Once | INTRAMUSCULAR | Status: DC | PRN

## 2019-07-26 NOTE — Discharge Instructions (Signed)
1)  The drugs that you have been given will stay in your system until tomorrow so for the       next 24 hours you should not:  A. Drive an automobile  B. Make any legal decisions  C. Drink any alcoholic beverages  2)  You may resume your regular meals upon return home.  3)  A responsible adult must take you home.  Someone should stay with you for a few          hours, then be available by phone for the remainder of the treatment day.  4)  You May experience any of the following symptoms:  Headache, Nausea and a dry mouth (due to the medications you were given),  temporary memory loss and some confusion, or sore muscles (a warm bath  should help this).  If you you experience any of these symptoms let us know on                your return visit.  5)  Report any of the following: any acute discomfort, severe headache, or temperature        greater than 100.5 F.   Also report any unusual redness, swelling, drainage, or pain         at your IV site.    You may report Symptoms to:  [place] [facility]          Phone: [phone], ECT Department           or Dr. Prescott Gum office [phone]  6)  Your next ECT Treatment is Friday October 23  We will call 2 days prior to your scheduled appointment for arrival times.  7)  Nothing to eat or drink after midnight the night before your procedure.  8)  Take morning meds with a sip of water the morning of your procedure.  9)  Other Instructions: Call [phone] to cancel the morning of your procedure due         to illness or emergency.  10) We will call within 72 hours to assess how you are feeling.

## 2019-07-26 NOTE — Anesthesia Post-op Follow-up Note (Signed)
Anesthesia QCDR form completed.        

## 2019-07-26 NOTE — Anesthesia Preprocedure Evaluation (Signed)
Anesthesia Evaluation  Patient identified by MRN, date of birth, ID band Patient awake    Reviewed: Allergy & Precautions, NPO status , Patient's Chart, lab work & pertinent test results, reviewed documented beta blocker date and time   Airway Mallampati: II  TM Distance: >3 FB     Dental  (+) Upper Dentures, Lower Dentures   Pulmonary           Cardiovascular hypertension, Pt. on medications + CAD  + dysrhythmias + pacemaker      Neuro/Psych PSYCHIATRIC DISORDERS Anxiety Depression    GI/Hepatic   Endo/Other    Renal/GU Renal disease     Musculoskeletal  (+) Arthritis ,   Abdominal   Peds  Hematology  (+) anemia ,   Anesthesia Other Findings Pacemaker has been dead for many yrs. Cardiologist decided not too take it out - according to pt. EKG shows old Lbbb. Hx of PVCs.  Reproductive/Obstetrics                             Anesthesia Physical  Anesthesia Plan  ASA: III  Anesthesia Plan: General   Post-op Pain Management:    Induction: Intravenous  PONV Risk Score and Plan:   Airway Management Planned:   Additional Equipment:   Intra-op Plan:   Post-operative Plan:   Informed Consent: I have reviewed the patients History and Physical, chart, labs and discussed the procedure including the risks, benefits and alternatives for the proposed anesthesia with the patient or authorized representative who has indicated his/her understanding and acceptance.       Plan Discussed with: CRNA  Anesthesia Plan Comments:         Anesthesia Quick Evaluation

## 2019-07-26 NOTE — H&P (Signed)
Barbara Marks is an 85 y.o. female.   Chief Complaint: more depressed and low appetite and poor sleep HPI: chronic depression  Past Medical History:  Diagnosis Date  . Anxiety   . CAD (coronary artery disease) Non-obstructive   a. 2004 Cath: nonobs dzs.  . DDD (degenerative disc disease)    a. with chronic right sided back pain - improves after seeing chiropractor.  . Depression    a. h/o ECT  . Diverticulitis   . Glaucoma   . HTN (hypertension)   . Hyperlipidemia, mixed   . Melanoma of skin ([place]) 2008   resected from Left arm   . Memory problem    "states memory issues"  . Nephrolithiasis   . Pancreatic cyst    a. [id number] Endoscopic U/S: nl UGI tract, 1-12mm pancreatic cysts, no masses/nodules.  . Premature ventricular contraction    a. managed with propafenone.  . Presence of permanent cardiac pacemaker   . Symptomatic bradycardia    a. s/p MDT PPM in 08/2005;  b. 02/2010 Gen change->MDT Adapta DC PPM, ser # [id number].    Past Surgical History:  Procedure Laterality Date  . APPENDECTOMY  1952  . CATARACT EXTRACTION, BILATERAL    . CHOLECYSTECTOMY  1983  . EUS N/A 06/22/2013   Procedure: UPPER ENDOSCOPIC ULTRASOUND (EUS) LINEAR;  Surgeon: Milus Banister, MD;  Location: [facility];  Service: Endoscopy;  Laterality: N/A;  . EYE SURGERY Bilateral    Cataract Extraction with IOL  . PACEMAKER INSERTION Left    [employer]  . TUMOR EXCISION     And nemamgeomas    Family History  Problem Relation Age of Onset  . Other Mother        died @ 79 - old age.  . Stroke Father        cva after cea in his 29's, died @ 27.  . Kidney cancer Brother    Social History:  reports that she has never smoked. She has never used smokeless tobacco. She reports that she does not drink alcohol or use drugs.  Allergies:  Allergies  Allergen Reactions  . Codeine     unknown  . Simvastatin Other (See Comments)    "leg cramps"    (Not in a hospital admission)   Results for orders  placed or performed during the hospital encounter of 07/25/19 (from the past 48 hour(s))  SARS CORONAVIRUS 2 (TAT 6-24 HRS) Nasopharyngeal Nasopharyngeal Swab     Status: None   Collection Time: 07/25/19 10:22 AM   Specimen: Nasopharyngeal Swab  Result Value Ref Range   SARS Coronavirus 2 NEGATIVE NEGATIVE    Comment: (NOTE) SARS-CoV-2 target nucleic acids are NOT DETECTED. The SARS-CoV-2 RNA is generally detectable in upper and lower respiratory specimens during the acute phase of infection. Negative results do not preclude SARS-CoV-2 infection, do not rule out co-infections with other pathogens, and should not be used as the sole basis for treatment or other patient management decisions. Negative results must be combined with clinical observations, patient history, and epidemiological information. The expected result is Negative. Fact Sheet for Patients: SugarRoll.be Fact Sheet for Healthcare Providers: https://www.woods-mathews.com/ This test is not yet approved or cleared by the [place] FDA and  has been authorized for detection and/or diagnosis of SARS-CoV-2 by FDA under an Emergency Use Authorization (EUA). This EUA will remain  in effect (meaning this test can be used) for the duration of the COVID-19 declaration under Section 56 4(b)(1) of the Act, 21  U.S.C. section 360bbb-3(b)(1), unless the authorization is terminated or revoked sooner. Performed at [facility], [place] [address], [place], [place] [address]    No results found.  Review of Systems  Constitutional: Negative.   HENT: Negative.   Eyes: Negative.   Respiratory: Negative.   Cardiovascular: Negative.   Gastrointestinal: Negative.   Musculoskeletal: Negative.   Skin: Negative.   Neurological: Negative.   Psychiatric/Behavioral: Positive for depression. Negative for hallucinations, memory loss, substance abuse and suicidal ideas. The patient has insomnia.  The patient is not nervous/anxious.     Blood pressure (!) 152/86, pulse 82, temperature 97.7 F (36.5 C), temperature source Oral, resp. rate 18, SpO2 99 %. Physical Exam  Nursing note and vitals reviewed. Constitutional: She appears well-developed and well-nourished.  HENT:  Head: Normocephalic and atraumatic.  Eyes: Pupils are equal, round, and reactive to light. Conjunctivae are normal.  Neck: Normal range of motion.  Cardiovascular: Regular rhythm and normal heart sounds.  Respiratory: Effort normal.  GI: Soft.  Musculoskeletal: Normal range of motion.  Neurological: She is alert.  Skin: Skin is warm and dry.  Psychiatric: Judgment normal. Her affect is blunt. Her speech is delayed. She is not agitated and not aggressive. Thought content is not paranoid. She expresses no homicidal and no suicidal ideation. She exhibits abnormal recent memory.     Assessment/Plan rec 2 treatments, today and friday  [name], MD 07/26/2019, 9:50 AM

## 2019-07-26 NOTE — Procedures (Signed)
ECT SERVICES Physician's Interval Evaluation & Treatment Note  Patient Identification: Barbara Marks MRN:  [id number] Date of Evaluation:  07/26/2019 TX #: 14  MADRS:   MMSE:   P.E. Findings:  No obvious change physical exam heart and lungs normal.  Psychiatric Interval Note:  Patient does seem still to be blunted flat down dysphoric not sleeping well not eating well.  Denies suicidal thoughts no evidence of psychosis  Subjective:  Patient is a 85 y.o. female seen for evaluation for Electroconvulsive Therapy. Aware that she is depressed down and negative  Treatment Summary:   []   Right Unilateral             [x]  Bilateral   % Energy : 1.0 ms 75%   Impedance: 2660 ohms  Seizure Energy Index: 3887 V squared  Postictal Suppression Index: 75%  Seizure Concordance Index: 90%  Medications  Pre Shock: Toradol 30 mg Robinul 80 mg succinylcholine 90 mg  Post Shock:    Seizure Duration: 24 seconds EMG 64 seconds EEG   Comments: Patient does agree to come back on Friday as I feel the 2 treatments in a row are much more likely to provide some benefit.  Lungs:  []   Clear to auscultation               []  Other:   Heart:    []   Regular rhythm             []  irregular rhythm    []   Previous H&P reviewed, patient examined and there are NO CHANGES                 []   Previous H&P reviewed, patient examined and there are changes noted.   [name], MD 10/21/20205:24 PM

## 2019-07-26 NOTE — Telephone Encounter (Signed)
Called patient to complete her Medicare Wellness visit and she stated that she was not aware and does not remember scheduling this visit. Patient states that she is coming in on 08/01/2019 for her physical and she will complete whatever needs to be done with the doctor at that time. Advised patient that I will go ahead and cancel today's phone visit.

## 2019-07-26 NOTE — Anesthesia Procedure Notes (Signed)
Date/Time: 07/26/2019 11:10 AM Performed by: Dionne Bucy, CRNA Pre-anesthesia Checklist: Patient identified, Emergency Drugs available, Suction available and Patient being monitored Patient Re-evaluated:Patient Re-evaluated prior to induction Oxygen Delivery Method: Circle system utilized Preoxygenation: Pre-oxygenation with 100% oxygen Induction Type: IV induction Ventilation: Mask ventilation without difficulty and Mask ventilation throughout procedure Airway Equipment and Method: Bite block Placement Confirmation: positive ETCO2 Dental Injury: Teeth and Oropharynx as per pre-operative assessment

## 2019-07-26 NOTE — Anesthesia Postprocedure Evaluation (Addendum)
Anesthesia Post Note  Patient: Barbara Marks  Procedure(s) Performed: ECT TX  Patient location during evaluation: PACU Anesthesia Type: General Level of consciousness: awake and alert and oriented Pain management: pain level controlled Vital Signs Assessment: post-procedure vital signs reviewed and stable Respiratory status: spontaneous breathing Cardiovascular status: blood pressure returned to baseline Anesthetic complications: no   Ectopy resolved.  Hx of ectopy in past.  Last Vitals:  Vitals:   07/26/19 1155 07/26/19 1206  BP: (!) 144/79 140/90  Pulse: 84   Resp: 18   Temp:    SpO2: 100%     Last Pain:  Vitals:   07/26/19 1119  TempSrc:   PainSc: Asleep                 Quy Lotts

## 2019-07-26 NOTE — Transfer of Care (Signed)
Immediate Anesthesia Transfer of Care Note  Patient: Barbara Marks  Procedure(s) Performed: ECT TX  Patient Location: PACU  Anesthesia Type:General  Level of Consciousness: sedated  Airway & Oxygen Therapy: Patient Spontanous Breathing and Patient connected to face mask oxygen  Post-op Assessment: Report given to RN and Post -op Vital signs reviewed and stable  Post vital signs: Reviewed and stable  Last Vitals:  Vitals Value Taken Time  BP 170/76 07/26/19 1119  Temp    Pulse 68 07/26/19 1119  Resp 20 07/26/19 1120  SpO2 100 % 07/26/19 1119  Vitals shown include unvalidated device data.  Last Pain:  Vitals:   07/26/19 0925  TempSrc:   PainSc: 0-No pain         Complications: No apparent anesthesia complications

## 2019-07-27 ENCOUNTER — Other Ambulatory Visit (INDEPENDENT_AMBULATORY_CARE_PROVIDER_SITE_OTHER): Payer: PPO

## 2019-07-27 ENCOUNTER — Other Ambulatory Visit: Payer: Self-pay | Admitting: Psychiatry

## 2019-07-27 DIAGNOSIS — D649 Anemia, unspecified: Secondary | ICD-10-CM | POA: Diagnosis not present

## 2019-07-27 DIAGNOSIS — E782 Mixed hyperlipidemia: Secondary | ICD-10-CM

## 2019-07-27 DIAGNOSIS — I10 Essential (primary) hypertension: Secondary | ICD-10-CM

## 2019-07-27 LAB — COMPREHENSIVE METABOLIC PANEL
ALT: 11 U/L (ref 0–35)
AST: 17 U/L (ref 0–37)
Albumin: 4 g/dL (ref 3.5–5.2)
Alkaline Phosphatase: 60 U/L (ref 39–117)
BUN: 20 mg/dL (ref 6–23)
CO2: 32 mEq/L (ref 19–32)
Calcium: 9.4 mg/dL (ref 8.4–10.5)
Chloride: 102 mEq/L (ref 96–112)
Creatinine, Ser: 0.84 mg/dL (ref 0.40–1.20)
GFR: 64.76 mL/min (ref >60.00)
Glucose, Bld: 100 mg/dL — ABNORMAL HIGH (ref 70–99)
Potassium: 3.4 mEq/L — ABNORMAL LOW (ref 3.5–5.1)
Sodium: 141 mEq/L (ref 135–145)
Total Bilirubin: 0.7 mg/dL (ref 0.2–1.2)
Total Protein: 6.5 g/dL (ref 6.0–8.3)

## 2019-07-27 LAB — CBC
HCT: 40.1 % (ref 36.0–46.0)
Hemoglobin: 13.4 g/dL (ref 12.0–15.0)
MCHC: 33.3 g/dL (ref 30.0–36.0)
MCV: 93.9 fl (ref 78.0–100.0)
Platelets: 205 10*3/uL (ref 150.0–400.0)
RBC: 4.27 Mil/uL (ref 3.87–5.11)
RDW: 13.2 % (ref 11.5–15.5)
WBC: 6.6 10*3/uL (ref 4.0–10.5)

## 2019-07-27 LAB — LIPID PANEL
Cholesterol: 235 mg/dL — ABNORMAL HIGH (ref 0–200)
HDL: 63 mg/dL (ref >39.00)
LDL Cholesterol: 156 mg/dL — ABNORMAL HIGH (ref 0–99)
NonHDL: 171.52
Total CHOL/HDL Ratio: 4
Triglycerides: 79 mg/dL (ref 0.0–149.0)
VLDL: 15.8 mg/dL (ref 0.0–40.0)

## 2019-07-31 ENCOUNTER — Encounter: Payer: PPO | Admitting: Family Medicine

## 2019-08-01 ENCOUNTER — Ambulatory Visit (INDEPENDENT_AMBULATORY_CARE_PROVIDER_SITE_OTHER): Payer: PPO | Admitting: Family Medicine

## 2019-08-01 ENCOUNTER — Encounter: Payer: Self-pay | Admitting: Family Medicine

## 2019-08-01 ENCOUNTER — Other Ambulatory Visit: Payer: Self-pay

## 2019-08-01 VITALS — BP 142/84 | HR 84 | Temp 98.3°F | Ht 66.0 in | Wt 149.5 lb

## 2019-08-01 DIAGNOSIS — Z0000 Encounter for general adult medical examination without abnormal findings: Secondary | ICD-10-CM

## 2019-08-01 NOTE — Patient Instructions (Addendum)
Bone Density Test The bone density test uses a special type of X-ray to measure the amount of calcium and other minerals in your bones. It can measure bone density in the hip and the spine. The test procedure is similar to having a regular X-ray. This test may also be called:  Bone densitometry.  Bone mineral density test.  Dual-energy X-ray absorptiometry (DEXA). You may have this test to:  Diagnose a condition that causes weak or thin bones (osteoporosis).  Screen you for osteoporosis.  Predict your risk for a broken bone (fracture).  Determine how well your osteoporosis treatment is working. Tell a health care provider about:  Any allergies you have.  All medicines you are taking, including vitamins, herbs, eye drops, creams, and over-the-counter medicines.  Any problems you or family members have had with anesthetic medicines.  Any blood disorders you have.  Any surgeries you have had.  Any medical conditions you have.  Whether you are pregnant or may be pregnant.  Any medical tests you have had within the past 14 days that used contrast material. What are the risks? Generally, this is a safe procedure. However, it does expose you to a small amount of radiation, which can slightly increase your cancer risk. What happens before the procedure?  Do not take any calcium supplements starting 24 hours before your test.  Remove all metal jewelry, eyeglasses, dental appliances, and any other metal objects. What happens during the procedure?   You will lie down on an exam table. There will be an X-ray generator below you and an imaging device above you.  Other devices, such as boxes or braces, may be used to position your body properly for the scan.  The machine will slowly scan your body. You will need to keep still.  The images will show up on a screen in the room. Images will be examined by a specialist after your test is done. The procedure may vary among health care  providers and hospitals. What happens after the procedure?  It is up to you to get your test results. Ask your health care provider, or the department that is doing the test, when your results will be ready. Summary  A bone density test is an imaging test that uses a type of X-ray to measure the amount of calcium and other minerals in your bones.  The test may be used to diagnose or screen you for a condition that causes weak or thin bones (osteoporosis), predict your risk for a broken bone (fracture), or determine how well your osteoporosis treatment is working.  Do not take any calcium supplements starting 24 hours before your test.  Ask your health care provider, or the department that is doing the test, when your results will be ready. This information is not intended to replace advice given to you by your health care provider. Make sure you discuss any questions you have with your health care provider. Document Released: 10/13/2004 Document Revised: 10/07/2017 Document Reviewed: 07/26/2017 Elsevier Patient Education  2020 [place] - overall good - try to work on short recall - You could read something and try to recall what you read after that

## 2019-08-01 NOTE — Progress Notes (Signed)
Subjective:   Barbara Marks is a 85 y.o. female who presents for Medicare Annual (Subsequent) preventive examination.  Review of Systems:  Review of Systems  Constitutional: Negative for chills and fever.  HENT: Negative for congestion, hearing loss and sore throat.   Eyes: Negative for blurred vision and double vision.  Respiratory: Negative for shortness of breath.   Cardiovascular: Negative for chest pain.  Gastrointestinal: Positive for constipation. Negative for blood in stool, diarrhea and heartburn.  Genitourinary: Positive for frequency. Negative for dysuria.  Musculoskeletal: Negative.  Negative for falls.  Skin: Negative for rash.  Neurological: Positive for headaches. Negative for dizziness.  Endo/Heme/Allergies: Bruises/bleeds easily.  Psychiatric/Behavioral: The patient has insomnia.         Insomnia - drinks tea and takes sleep answer (melatonin in liquid and tablet form) - natural remedies seem to help  Constipation - using warm prune juice    Objective:     Vitals: BP (!) 142/84   Pulse 84   Temp 98.3 F (36.8 C)   Ht 5\' 6"  (1.676 m)   Wt 149 lb 8 oz (67.8 kg)   SpO2 97%   BMI 24.13 kg/m   Body mass index is 24.13 kg/m.  Advanced Directives 11/19/2015 09/06/2013 06/22/2013  Does Patient Have a Medical Advance Directive? Yes Patient has advance directive, copy not in chart Patient has advance directive, copy not in chart  Type of Advance Directive [place];Living will Living will -  Does patient want to make changes to medical advance directive? No - Patient declined No -  Copy of Lead in Chart? - Copy requested from family -  Pre-existing out of facility DNR order (yellow form or pink MOST form) - No -  Some encounter information is confidential and restricted. Go to Review Flowsheets activity to see all data.    Tobacco Social History   Tobacco Use  Smoking Status Never Smoker  Smokeless Tobacco  Never Used     Counseling given: Not Answered   Clinical Intake: Reviewed form and scanned Reviewed some missed hearing - offered referral and pt declined. Will f/u next year.  Sees eye doctor and corrected vision   Past Medical History:  Diagnosis Date  . Anxiety   . CAD (coronary artery disease) Non-obstructive   a. 2004 Cath: nonobs dzs.  . DDD (degenerative disc disease)    a. with chronic right sided back pain - improves after seeing chiropractor.  . Depression    a. h/o ECT  . Diverticulitis   . Glaucoma   . HTN (hypertension)   . Hyperlipidemia, mixed   . Melanoma of skin ([place]) 2008   resected from Left arm   . Memory problem    "states memory issues"  . Nephrolithiasis   . Pancreatic cyst    a. [id number] Endoscopic U/S: nl UGI tract, 1-4mm pancreatic cysts, no masses/nodules.  . Premature ventricular contraction    a. managed with propafenone.  . Presence of permanent cardiac pacemaker   . Symptomatic bradycardia    a. s/p MDT PPM in 08/2005;  b. 02/2010 Gen change->MDT Adapta DC PPM, ser # [id number].   Past Surgical History:  Procedure Laterality Date  . APPENDECTOMY  1952  . CATARACT EXTRACTION, BILATERAL    . CHOLECYSTECTOMY  1983  . EUS N/A 06/22/2013   Procedure: UPPER ENDOSCOPIC ULTRASOUND (EUS) LINEAR;  Surgeon: Milus Banister, MD;  Location: [facility];  Service: Endoscopy;  Laterality: N/A;  .  EYE SURGERY Bilateral    Cataract Extraction with IOL  . PACEMAKER INSERTION Left    [employer]tr[employer]  . TUMOR EXCISION     And nemamgeomas   Family History  Problem Relation Age of Onset  . Other Mother        died @ 70 - old age.  . Stroke Father        cva after cea in his 18's, died @ 23.  . Kidney cancer Brother    Social History   Socioeconomic History  . Marital status: Widowed    Spouse name: Not on file  . Number of children: 3  . Years of education: some college  . Highest education level: Not on file  Occupational History  . Occupation:  Retired    Fish farm manager: RETIRED  Social Needs  . Financial resource strain: Not hard at all  . Food insecurity    Worry: Never true    Inability: Never true  . Transportation needs    Medical: No    Non-medical: No  Tobacco Use  . Smoking status: Never Smoker  . Smokeless tobacco: Never Used  Substance and Sexual Activity  . Alcohol use: No  . Drug use: No  . Sexual activity: Not Currently  Lifestyle  . Physical activity    Days per week: 0 days    Minutes per session: 0 min  . Stress: Very much  Relationships  . Social connections    Talks on phone: More than three times a week    Gets together: More than three times a week    Attends religious service: More than 4 times per year    Active member of club or organization: No    Attends meetings of clubs or organizations: Never    Relationship status: Widowed  Other Topics Concern  . Not on file  Social History Narrative   Lives in [place]. Lives with a friend (also 2 year - Paul).    3 Kids, 7 grandchildren, 1 great-grandchild   Enjoys: movies on Friday with friends, walks   Exercise: walking around the house for exercise   Diet: likes everything    Outpatient Encounter Medications as of 08/01/2019  Medication Sig  . amLODipine (NORVASC) 5 MG tablet Take 1 tablet (5 mg total) by mouth every morning.  [name] buPROPion (WELLBUTRIN XL) 300 MG 24 hr tablet TAKE 1 TABLET BY MOUTH EVERY OTHER DAY  . Coenzyme Q10 (COQ10) 150 MG CAPS Take 1 capsule by mouth daily.    . Cyanocobalamin (VITAMIN B 12 PO) Take 1 tablet by mouth daily.   . DULoxetine (CYMBALTA) 60 MG capsule Take 1 capsule by mouth once daily (Patient taking differently: 1 every other day)  . ketoconazole (NIZORAL) 2 % shampoo   . Multiple Vitamin (MULTIVITAMIN) tablet Take 1 tablet by mouth daily.    . Multiple Vitamins-Minerals (PRESERVISION AREDS) TABS Take 1 tablet by mouth daily.   [name] OVER THE COUNTER MEDICATION Nocturest Liquid 1/4 tsp at bedtime  . potassium  chloride (K-DUR) 10 MEQ tablet Take 1 tablet (10 mEq total) by mouth daily.  . Red Yeast Rice 600 MG CAPS Take 600 mg by mouth daily.  . [DISCONTINUED] metroNIDAZOLE (FLAGYL) 500 MG tablet Take 1 tablet (500 mg total) by mouth 3 (three) times daily.   No facility-administered encounter medications on file as of 08/01/2019.     Activities of Daily Living Normal   Patient Care Team: Lesleigh Noe, MD as PCP - General (Family  Medicine) Clapacs, Madie Reno, MD as Consulting Physician (Psychiatry)    Assessment:   This is a routine wellness examination for [facility].  Fall Risk Fall Risk  08/01/2019  Falls in the past year? 0  Number falls in past yr: 0  Injury with Fall? 0   Is the patient's home free of loose throw rugs in walkways, pet beds, electrical cords, etc?   yes      Grab bars in the bathroom? yes      Handrails on the stairs?   No stairs      Adequate lighting?   yes  Timed Get Up and Go performed: n/a no recent falls  Depression Screen Managed by psychiatry   Cognitive Function MMSE - Mini Mental State Exam 08/01/2019  Orientation to time 5  Orientation to Place 5  Registration 3  Attention/ Calculation 5  Recall 2  Language- name 2 objects 2  Language- repeat 1  Language- follow 3 step command 3  Language- read & follow direction 1  Write a sentence 1  Copy design 1  Total score 29  Some encounter information is confidential and restricted. Go to Review Flowsheets activity to see all data.         There is no immunization history on file for this patient.  Qualifies for Shingles Vaccine? Declines shingles shot  Screening Tests Health Maintenance  Topic Date Due  . DEXA SCAN  08/25/2001  . TETANUS/TDAP  10/25/2019 (Originally 08/26/1953)  . PNA vac Low Risk Adult (1 of 2 - PCV13) 10/25/2019 (Originally 08/25/2001)  . INFLUENZA VACCINE  01/03/2020 (Originally 05/06/2019)   Declines shots today  Cancer Screenings: Up to date of Bone Density/Dexa?  No - discussed and not interested at this time Colorectal: not indicated  Additional Screenings: :   CMP Latest Ref Rng & Units 07/27/2019 10/31/2018 04/18/2018  Glucose 70 - 99 mg/dL 100(H) - -  BUN 6 - 23 mg/dL 20 - 18  Creatinine 0.40 - 1.20 mg/dL 0.84 0.80 0.8  Sodium 135 - 145 mEq/L 141 - 140  Potassium 3.5 - 5.1 mEq/L 3.4(L) - 3.9  Chloride 96 - 112 mEq/L 102 - -  CO2 19 - 32 mEq/L 32 - -  Calcium 8.4 - 10.5 mg/dL 9.4 - -  Total Protein 6.0 - 8.3 g/dL 6.5 - -  Total Bilirubin 0.2 - 1.2 mg/dL 0.7 - -  Alkaline Phos 39 - 117 U/L 60 - -  AST 0 - 37 U/L 17 - -  ALT 0 - 35 U/L 11 - -   CBC    Component Value Date/Time   WBC 6.6 07/27/2019 0935   RBC 4.27 07/27/2019 0935   HGB 13.4 07/27/2019 0935   HGB 12.4 10/21/2012 1438   HCT 40.1 07/27/2019 0935   HCT 37.7 10/21/2012 1438   PLT 205.0 07/27/2019 0935   PLT 198 10/21/2012 1438   MCV 93.9 07/27/2019 0935   MCV 93 10/21/2012 1438   MCH 29.2 11/19/2015 1219   MCHC 33.3 07/27/2019 0935   RDW 13.2 07/27/2019 0935   RDW 12.8 10/21/2012 1438   Lipid Panel     Component Value Date/Time   CHOL 235 (H) 07/27/2019 0935   TRIG 79.0 07/27/2019 0935   HDL 63.00 07/27/2019 0935   CHOLHDL 4 07/27/2019 0935   VLDL 15.8 07/27/2019 0935   LDLCALC 156 (H) 07/27/2019 0935       Plan:    I have personally reviewed and noted the following in  the patient's chart:   . Medical and social history . Use of alcohol, tobacco or illicit drugs  . Current medications and supplements . Functional ability and status . Nutritional status . Physical activity . Advanced directives . List of other physicians . Hospitalizations, surgeries, and ER visits in previous 12 months . Vitals . Screenings to include cognitive, depression, and falls . Referrals and appointments  In addition, I have reviewed and discussed with patient certain preventive protocols, quality metrics, and best practice recommendations. A written personalized care plan  for preventive services as well as general preventive health recommendations were provided to patient.     Cognitive - recalled 2/3 words, but otherwise normal MMSE Hearing - some difficulty, but pt would like to wait until next year Dexa - discussed and pt will consider but does not want to do at this time. Will focus on prevention  Cholesterol - a little high, does not tolerate statins and taking red yeast rice    Lesleigh Noe, MD  08/01/2019   Hearing Screening   125Hz  250Hz  500Hz  1000Hz  2000Hz  3000Hz  4000Hz  6000Hz  8000Hz   Right ear:   40 40 40  40    Left ear:   40 40 0  0      Visual Acuity Screening   Right eye Left eye Both eyes  Without correction:     With correction: 20/25 20/25 20/20

## 2019-08-10 ENCOUNTER — Telehealth: Payer: Self-pay

## 2019-08-10 ENCOUNTER — Encounter: Payer: Self-pay | Admitting: Family Medicine

## 2019-08-10 NOTE — Telephone Encounter (Signed)
Pt left v/m requesting cb about 07/27/19 lab results. Pt seen [place] on 08/01/19.

## 2019-08-10 NOTE — Telephone Encounter (Signed)
See my chart message

## 2019-08-10 NOTE — Telephone Encounter (Signed)
Spoke with patient. She is not sure if the results were discussed during last office visit. Dr. Einar Pheasant please review and patient said it was ok to respond back through mychart. Thank you

## 2019-08-11 ENCOUNTER — Encounter: Payer: Self-pay | Admitting: Family Medicine

## 2019-10-31 ENCOUNTER — Other Ambulatory Visit: Payer: Self-pay | Admitting: Family Medicine

## 2019-10-31 ENCOUNTER — Other Ambulatory Visit: Payer: Self-pay | Admitting: Psychiatry

## 2019-11-06 ENCOUNTER — Telehealth: Payer: Self-pay

## 2019-11-08 ENCOUNTER — Other Ambulatory Visit
Admission: RE | Admit: 2019-11-08 | Discharge: 2019-11-08 | Disposition: A | Discharge status: Home / Self Care | Payer: PPO | Source: Ambulatory Visit | Attending: Psychiatry | Admitting: Psychiatry

## 2019-11-08 DIAGNOSIS — Z20822 Contact with and (suspected) exposure to covid-19: Secondary | ICD-10-CM | POA: Diagnosis not present

## 2019-11-08 DIAGNOSIS — Z01812 Encounter for preprocedural laboratory examination: Secondary | ICD-10-CM | POA: Insufficient documentation

## 2019-11-08 LAB — SARS CORONAVIRUS 2 (TAT 6-24 HRS): SARS Coronavirus 2: NEGATIVE

## 2019-11-09 ENCOUNTER — Other Ambulatory Visit: Payer: Self-pay | Admitting: Psychiatry

## 2019-11-10 ENCOUNTER — Encounter: Payer: Self-pay | Admitting: Certified Registered Nurse Anesthetist

## 2019-11-10 ENCOUNTER — Other Ambulatory Visit: Payer: Self-pay

## 2019-11-10 ENCOUNTER — Encounter
Admission: RE | Admit: 2019-11-10 | Discharge: 2019-11-10 | Disposition: A | Discharge status: Home / Self Care | Payer: PPO | Source: Ambulatory Visit | Attending: Psychiatry | Admitting: Psychiatry

## 2019-11-10 DIAGNOSIS — E782 Mixed hyperlipidemia: Secondary | ICD-10-CM | POA: Diagnosis not present

## 2019-11-10 DIAGNOSIS — F419 Anxiety disorder, unspecified: Secondary | ICD-10-CM | POA: Diagnosis not present

## 2019-11-10 DIAGNOSIS — Z79899 Other long term (current) drug therapy: Secondary | ICD-10-CM | POA: Insufficient documentation

## 2019-11-10 DIAGNOSIS — I10 Essential (primary) hypertension: Secondary | ICD-10-CM | POA: Insufficient documentation

## 2019-11-10 DIAGNOSIS — F418 Other specified anxiety disorders: Secondary | ICD-10-CM | POA: Diagnosis not present

## 2019-11-10 DIAGNOSIS — F339 Major depressive disorder, recurrent, unspecified: Secondary | ICD-10-CM | POA: Diagnosis not present

## 2019-11-10 DIAGNOSIS — F332 Major depressive disorder, recurrent severe without psychotic features: Secondary | ICD-10-CM | POA: Diagnosis not present

## 2019-11-10 DIAGNOSIS — H409 Unspecified glaucoma: Secondary | ICD-10-CM | POA: Insufficient documentation

## 2019-11-10 DIAGNOSIS — Z7901 Long term (current) use of anticoagulants: Secondary | ICD-10-CM | POA: Diagnosis not present

## 2019-11-10 DIAGNOSIS — I2510 Atherosclerotic heart disease of native coronary artery without angina pectoris: Secondary | ICD-10-CM | POA: Insufficient documentation

## 2019-11-10 DIAGNOSIS — Z950 Presence of cardiac pacemaker: Secondary | ICD-10-CM | POA: Diagnosis not present

## 2019-11-10 MED ORDER — GLYCOPYRROLATE 0.2 MG/ML IJ SOLN: 0.1000 mg | Freq: Once | INTRAMUSCULAR | Status: AC

## 2019-11-10 MED ORDER — METHOHEXITAL SODIUM 100 MG/10ML IV SOSY
PREFILLED_SYRINGE | INTRAVENOUS | Status: DC | PRN
  Administered 2019-11-10: 80 mg via INTRAVENOUS

## 2019-11-10 MED ORDER — SUCCINYLCHOLINE CHLORIDE 20 MG/ML IJ SOLN
INTRAMUSCULAR | Status: AC
  Filled 2019-11-10: qty 1

## 2019-11-10 MED ORDER — SUCCINYLCHOLINE CHLORIDE 20 MG/ML IJ SOLN
INTRAMUSCULAR | Status: DC | PRN
  Administered 2019-11-10: 90 mg via INTRAVENOUS

## 2019-11-10 MED ORDER — SODIUM CHLORIDE 0.9 % IV SOLN
500.0000 mL | Freq: Once | INTRAVENOUS | Status: AC
  Administered 2019-11-10: 500 mL via INTRAVENOUS

## 2019-11-10 MED ORDER — GLYCOPYRROLATE 0.2 MG/ML IJ SOLN
INTRAMUSCULAR | Status: AC
  Administered 2019-11-10: 0.1 mg via INTRAVENOUS
  Filled 2019-11-10: qty 1

## 2019-11-10 MED ORDER — METHOHEXITAL SODIUM 0.5 G IJ SOLR
INTRAMUSCULAR | Status: AC
  Filled 2019-11-10: qty 500

## 2019-11-10 MED ORDER — KETOROLAC TROMETHAMINE 30 MG/ML IJ SOLN
INTRAMUSCULAR | Status: AC
  Administered 2019-11-10: 30 mg via INTRAVENOUS
  Filled 2019-11-10: qty 1

## 2019-11-10 MED ORDER — SODIUM CHLORIDE 0.9 % IV SOLN: INTRAVENOUS | Status: DC | PRN

## 2019-11-10 MED ORDER — KETOROLAC TROMETHAMINE 30 MG/ML IJ SOLN: 30.0000 mg | Freq: Once | INTRAMUSCULAR | Status: AC

## 2019-11-10 NOTE — Anesthesia Procedure Notes (Signed)
Procedure Name: General with mask airway Date/Time: 11/10/2019 10:05 AM Performed by: Caryl Asp, CRNA Pre-anesthesia Checklist: Patient identified, Emergency Drugs available, Suction available and Patient being monitored Patient Re-evaluated:Patient Re-evaluated prior to induction Oxygen Delivery Method: Circle system utilized Preoxygenation: Pre-oxygenation with 100% oxygen Induction Type: IV induction Ventilation: Mask ventilation without difficulty and Mask ventilation throughout procedure Airway Equipment and Method: Bite block Placement Confirmation: positive ETCO2 Dental Injury: Teeth and Oropharynx as per pre-operative assessment

## 2019-11-10 NOTE — H&P (Signed)
Barbara Marks is an 86 y.o. female.   Chief Complaint: Patient with chronic recurrent depression called because of worsening depression for the last couple weeks HPI: History of recurrent depression with good response to ECT in the past  Past Medical History:  Diagnosis Date  . Anxiety   . CAD (coronary artery disease) Non-obstructive   a. 2004 Cath: nonobs dzs.  . DDD (degenerative disc disease)    a. with chronic right sided back pain - improves after seeing chiropractor.  . Depression    a. h/o ECT  . Diverticulitis   . Glaucoma   . HTN (hypertension)   . Hyperlipidemia, mixed   . Melanoma of skin ([place]) 2008   resected from Left arm   . Memory problem    "states memory issues"  . Nephrolithiasis   . Pancreatic cyst    a. [id number] Endoscopic U/S: nl UGI tract, 1-38mm pancreatic cysts, no masses/nodules.  . Premature ventricular contraction    a. managed with propafenone.  . Presence of permanent cardiac pacemaker   . Symptomatic bradycardia    a. s/p MDT PPM in 08/2005;  b. 02/2010 Gen change->MDT Adapta DC PPM, ser # [id number].    Past Surgical History:  Procedure Laterality Date  . APPENDECTOMY  1952  . CATARACT EXTRACTION, BILATERAL    . CHOLECYSTECTOMY  1983  . EUS N/A 06/22/2013   Procedure: UPPER ENDOSCOPIC ULTRASOUND (EUS) LINEAR;  Surgeon: Milus Banister, MD;  Location: [facility];  Service: Endoscopy;  Laterality: N/A;  . EYE SURGERY Bilateral    Cataract Extraction with IOL  . PACEMAKER INSERTION Left    [employer]tr[employer]  . TUMOR EXCISION     And nemamgeomas    Family History  Problem Relation Age of Onset  . Other Mother        died @ 58 - old age.  . Stroke Father        cva after cea in his 22's, died @ 59.  . Kidney cancer Brother    Social History:  reports that she has never smoked. She has never used smokeless tobacco. She reports that she does not drink alcohol or use drugs.  Allergies:  Allergies  Allergen Reactions  . Codeine     unknown  .  Simvastatin Other (See Comments)    "leg cramps"    (Not in a hospital admission)   Results for orders placed or performed during the hospital encounter of 11/08/19 (from the past 48 hour(s))  SARS CORONAVIRUS 2 (TAT 6-24 HRS) Nasopharyngeal Nasopharyngeal Swab     Status: None   Collection Time: 11/08/19 11:33 AM   Specimen: Nasopharyngeal Swab  Result Value Ref Range   SARS Coronavirus 2 NEGATIVE NEGATIVE    Comment: (NOTE) SARS-CoV-2 target nucleic acids are NOT DETECTED. The SARS-CoV-2 RNA is generally detectable in upper and lower respiratory specimens during the acute phase of infection. Negative results do not preclude SARS-CoV-2 infection, do not rule out co-infections with other pathogens, and should not be used as the sole basis for treatment or other patient management decisions. Negative results must be combined with clinical observations, patient history, and epidemiological information. The expected result is Negative. Fact Sheet for Patients: SugarRoll.be Fact Sheet for Healthcare Providers: https://www.woods-mathews.com/ This test is not yet approved or cleared by the [place] FDA and  has been authorized for detection and/or diagnosis of SARS-CoV-2 by FDA under an Emergency Use Authorization (EUA). This EUA will remain  in effect (meaning this test can  be used) for the duration of the COVID-19 declaration under Section 56 4(b)(1) of the Act, 21 U.S.C. section 360bbb-3(b)(1), unless the authorization is terminated or revoked sooner. Performed at [facility], [place] [address] [place], [place] [address]    No results found.  Review of Systems  Constitutional: Negative.   HENT: Negative.   Eyes: Negative.   Respiratory: Negative.   Cardiovascular: Negative.   Gastrointestinal: Negative.   Musculoskeletal: Negative.   Skin: Negative.   Neurological: Negative.   Psychiatric/Behavioral: Positive for  dysphoric mood.    Blood pressure (!) 159/80, pulse 81, temperature 98.8 F (37.1 C), temperature source Oral, resp. rate 18, height 5\' 6"  (1.676 m), weight 66.2 kg, SpO2 98 %. Physical Exam  Nursing note and vitals reviewed. Constitutional: She appears well-developed and well-nourished.  HENT:  Head: Normocephalic and atraumatic.  Eyes: Pupils are equal, Marks, and reactive to light. Conjunctivae are normal.  Cardiovascular: Regular rhythm and normal heart sounds.  Respiratory: Effort normal.  GI: Soft.  Musculoskeletal:        General: Normal range of motion.     Cervical back: Normal range of motion.  Neurological: She is alert.  Skin: Skin is warm and dry.  Psychiatric: Judgment normal. Her affect is blunt. Her speech is delayed. She is slowed. Cognition and memory are impaired. She expresses no homicidal and no suicidal ideation.     Assessment/Plan Recommend that we see her back sooner rather than in a few months and she is agreeable to another treatment next week.  [name], MD 11/10/2019, 10:08 AM

## 2019-11-10 NOTE — Discharge Instructions (Addendum)
1)  The drugs that you have been given will stay in your system until tomorrow so for the       next 24 hours you should not:  A. Drive an automobile  B. Make any legal decisions  C. Drink any alcoholic beverages  2)  You may resume your regular meals upon return home.  3)  A responsible adult must take you home.  Someone should stay with you for a few          hours, then be available by phone for the remainder of the treatment day.  4)  You May experience any of the following symptoms:  Headache, Nausea and a dry mouth (due to the medications you were given),  temporary memory loss and some confusion, or sore muscles (a warm bath  should help this).  If you you experience any of these symptoms let us know on                your return visit.  5)  Report any of the following: any acute discomfort, severe headache, or temperature        greater than 100.5 F.   Also report any unusual redness, swelling, drainage, or pain         at your IV site.    You may report Symptoms to:  [place] [facility]          Phone: [phone], ECT Department           or Dr. Prescott Gum office [phone]  6)  Your next ECT Treatment is Friday January 12 at 8:30   We will call 2 days prior to your scheduled appointment for arrival times.  7)  Nothing to eat or drink after midnight the night before your procedure.  8)  Take morning meds with a sip of water the morning of your procedure.  9)  Other Instructions: Call [phone] to cancel the morning of your procedure due         to illness or emergency.  10) We will call within 72 hours to assess how you are feeling.

## 2019-11-10 NOTE — Transfer of Care (Signed)
Immediate Anesthesia Transfer of Care Note  Patient: Barbara Marks  Procedure(s) Performed: ECT TX  Patient Location: PACU  Anesthesia Type:General  Level of Consciousness: awake, alert  and oriented  Airway & Oxygen Therapy: Patient Spontanous Breathing and Patient connected to face mask oxygen  Post-op Assessment: Report given to RN and Post -op Vital signs reviewed and stable  Post vital signs: Reviewed and stable  Last Vitals:  Vitals Value Taken Time  BP 162/79 11/10/19 1024  Temp 36.4 C 11/10/19 1024  Pulse 87 11/10/19 1025  Resp 22 11/10/19 1025  SpO2 100 % 11/10/19 1025  Vitals shown include unvalidated device data.  Last Pain:  Vitals:   11/10/19 0859  TempSrc:   PainSc: 0-No pain         Complications: No apparent anesthesia complications

## 2019-11-10 NOTE — Anesthesia Preprocedure Evaluation (Signed)
Anesthesia Evaluation  Patient identified by MRN, date of birth, ID band Patient awake    Reviewed: Allergy & Precautions, NPO status , Patient's Chart, lab work & pertinent test results  History of Anesthesia Complications Negative for: history of anesthetic complications  Airway Mallampati: II  TM Distance: >3 FB Neck ROM: Full    Dental  (+) Poor Dentition   Pulmonary neg pulmonary ROS, neg sleep apnea, neg COPD,    breath sounds clear to auscultation- rhonchi (-) wheezing      Cardiovascular Exercise Tolerance: Good hypertension, Pt. on medications + CAD  (-) Past MI, (-) Cardiac Stents and (-) CABG + pacemaker (put in for PVCs)  Rhythm:Regular Rate:Normal - Systolic murmurs and - Diastolic murmurs    Neuro/Psych neg Seizures PSYCHIATRIC DISORDERS Anxiety Depression negative neurological ROS     GI/Hepatic negative GI ROS, Neg liver ROS,   Endo/Other  negative endocrine ROSneg diabetes  Renal/GU Renal disease: hx of nephrolithiasis.     Musculoskeletal  (+) Arthritis ,   Abdominal (+) - obese,   Peds  Hematology  (+) anemia ,   Anesthesia Other Findings Past Medical History: No date: Anxiety Non-obstructive: CAD (coronary artery disease)     Comment:  a. 2004 Cath: nonobs dzs. No date: DDD (degenerative disc disease)     Comment:  a. with chronic right sided back pain - improves after               seeing chiropractor. No date: Depression     Comment:  a. h/o ECT No date: Diverticulitis No date: Glaucoma No date: HTN (hypertension) No date: Hyperlipidemia, mixed 2008: Melanoma of skin ([place])     Comment:  resected from Left arm  No date: Memory problem     Comment:  "states memory issues" No date: Nephrolithiasis No date: Pancreatic cyst     Comment:  a. [id number] Endoscopic U/S: nl UGI tract, 1-58mm pancreatic               cysts, no masses/nodules. No date: Premature ventricular contraction  Comment:  a. managed with propafenone. No date: Presence of permanent cardiac pacemaker No date: Symptomatic bradycardia     Comment:  a. s/p MDT PPM in 08/2005;  b. 02/2010 Gen change->MDT               Adapta DC PPM, ser # [id number].   Reproductive/Obstetrics                             Anesthesia Physical  Anesthesia Plan  ASA: II  Anesthesia Plan: General   Post-op Pain Management:    Induction: Intravenous  PONV Risk Score and Plan: 2 and Ondansetron  Airway Management Planned: Mask  Additional Equipment:   Intra-op Plan:   Post-operative Plan:   Informed Consent: I have reviewed the patients History and Physical, chart, labs and discussed the procedure including the risks, benefits and alternatives for the proposed anesthesia with the patient or authorized representative who has indicated his/her understanding and acceptance.     Dental advisory given  Plan Discussed with: CRNA and Anesthesiologist  Anesthesia Plan Comments:         Anesthesia Quick Evaluation

## 2019-11-10 NOTE — Anesthesia Postprocedure Evaluation (Signed)
Anesthesia Post Note  Patient: Barbara Marks  Procedure(s) Performed: ECT TX  Patient location during evaluation: PACU Anesthesia Type: General Level of consciousness: awake and alert Pain management: pain level controlled Vital Signs Assessment: post-procedure vital signs reviewed and stable Respiratory status: spontaneous breathing, nonlabored ventilation and respiratory function stable Cardiovascular status: blood pressure returned to baseline and stable Postop Assessment: no signs of nausea or vomiting Anesthetic complications: no     Last Vitals:  Vitals:   11/10/19 1034 11/10/19 1044  BP: (!) 149/79 137/76  Pulse: 92 94  Resp: (!) 21 (!) 26  Temp:  (!) 36.4 C  SpO2: 100% 99%    Last Pain:  Vitals:   11/10/19 1044  TempSrc:   PainSc: 0-No pain                 Cornellius Kropp

## 2019-11-10 NOTE — Procedures (Signed)
ECT SERVICES Physician's Interval Evaluation & Treatment Note  Patient Identification: CHACE FLEER MRN:  [id number] Date of Evaluation:  11/10/2019 TX #: 15  MADRS:   MMSE:   P.E. Findings:  No changes to physical exam.  Still looks like she is not gaining weight back.  Psychiatric Interval Note:  Mood down depressed flat no interest in anything not eating well  Subjective:  Patient is a 86 y.o. female seen for evaluation for Electroconvulsive Therapy. Understands that she is to be depressed.  Not suicidal not psychotic  Treatment Summary:   []   Right Unilateral             [x]  Bilateral   % Energy : 1.0 ms 75%   Impedance: 2700 ohms  Seizure Energy Index: 41,160 V squared  Postictal Suppression Index: 76%  Seizure Concordance Index: 72%  Medications  Pre Shock: Toradol 30 mg Brevital 80 mg succinylcholine 90 mg  Post Shock:    Seizure Duration: 34 seconds by movement 62 seconds by my reading of the EEG   Comments: Open to trying get her in next week as well.  Lungs:  [x]   Clear to auscultation               []  Other:   Heart:    [x]   Regular rhythm             []  irregular rhythm    [x]   Previous H&P reviewed, patient examined and there are NO CHANGES                 []   Previous H&P reviewed, patient examined and there are changes noted.   [name], MD 2/5/202110:10 AM

## 2019-11-13 ENCOUNTER — Telehealth: Payer: Self-pay

## 2019-11-14 ENCOUNTER — Other Ambulatory Visit: Payer: Self-pay | Admitting: Psychiatry

## 2019-11-15 ENCOUNTER — Other Ambulatory Visit: Payer: Self-pay

## 2019-11-15 ENCOUNTER — Encounter: Payer: Self-pay | Admitting: Anesthesiology

## 2019-11-15 ENCOUNTER — Encounter (HOSPITAL_BASED_OUTPATIENT_CLINIC_OR_DEPARTMENT_OTHER)
Admission: RE | Admit: 2019-11-15 | Discharge: 2019-11-15 | Disposition: A | Discharge status: Home / Self Care | Payer: PPO | Source: Ambulatory Visit | Attending: Psychiatry | Admitting: Psychiatry

## 2019-11-15 DIAGNOSIS — F332 Major depressive disorder, recurrent severe without psychotic features: Secondary | ICD-10-CM

## 2019-11-15 DIAGNOSIS — F339 Major depressive disorder, recurrent, unspecified: Secondary | ICD-10-CM | POA: Diagnosis not present

## 2019-11-15 DIAGNOSIS — F418 Other specified anxiety disorders: Secondary | ICD-10-CM | POA: Diagnosis not present

## 2019-11-15 MED ORDER — SUCCINYLCHOLINE CHLORIDE 200 MG/10ML IV SOSY
PREFILLED_SYRINGE | INTRAVENOUS | Status: DC | PRN
  Administered 2019-11-15: 90 mg via INTRAVENOUS

## 2019-11-15 MED ORDER — SUCCINYLCHOLINE CHLORIDE 20 MG/ML IJ SOLN
INTRAMUSCULAR | Status: AC
  Filled 2019-11-15: qty 1

## 2019-11-15 MED ORDER — METHOHEXITAL SODIUM 100 MG/10ML IV SOSY: PREFILLED_SYRINGE | INTRAVENOUS | Status: DC | PRN

## 2019-11-15 MED ORDER — SODIUM CHLORIDE 0.9 % IV SOLN: 500.0000 mL | Freq: Once | INTRAVENOUS | Status: AC

## 2019-11-15 MED ORDER — METHOHEXITAL SODIUM 100 MG/10ML IV SOSY
PREFILLED_SYRINGE | INTRAVENOUS | Status: DC | PRN
  Administered 2019-11-15: 80 mg via INTRAVENOUS

## 2019-11-15 NOTE — Discharge Instructions (Signed)
1)  The drugs that you have been given will stay in your system until tomorrow so for the       next 24 hours you should not:  A. Drive an automobile  B. Make any legal decisions  C. Drink any alcoholic beverages  2)  You may resume your regular meals upon return home.  3)  A responsible adult must take you home.  Someone should stay with you for a few          hours, then be available by phone for the remainder of the treatment day.  4)  You May experience any of the following symptoms:  Headache, Nausea and a dry mouth (due to the medications you were given),  temporary memory loss and some confusion, or sore muscles (a warm bath  should help this).  If you you experience any of these symptoms let us know on                your return visit.  5)  Report any of the following: any acute discomfort, severe headache, or temperature        greater than 100.5 F.   Also report any unusual redness, swelling, drainage, or pain         at your IV site.    You may report Symptoms to:  [facility]          Phone: [phone], ECT Department           or Dr. Prescott Gum office [phone]  6)  Your next ECT Treatment is Wednesday February 17 at 8:45  We will call 2 days prior to your scheduled appointment for arrival times.  7)  Nothing to eat or drink after midnight the night before your procedure.  8)  Take      With a sip of water the morning of your procedure.  9)  Other Instructions: Call [phone] to cancel the morning of your procedure due         to illness or emergency.  10) We will call within 72 hours to assess how you are feeling.

## 2019-11-15 NOTE — Procedures (Signed)
ECT SERVICES Physician's Interval Evaluation & Treatment Note  Patient Identification: Barbara Marks MRN:  [id number] Date of Evaluation:  11/15/2019 TX #: 16  MADRS:   MMSE:   P.E. Findings:  No change to physical exam  Psychiatric Interval Note:  Patient still down depressed flat little interest in anything  Subjective:  Patient is a 86 y.o. female seen for evaluation for Electroconvulsive Therapy. Not feeling back to normal yet  Treatment Summary:   []   Right Unilateral             [x]  Bilateral   % Energy : 1.0 ms 75%   Impedance: 2270 ohms  Seizure Energy Index: 3154 V squared  Postictal Suppression Index: 57%  Seizure Concordance Index: 91%  Medications  Pre Shock: Toradol 30 mg Brevital 80 mg succinylcholine 90 mg  Post Shock:    Seizure Duration: 40 seconds EMG 63 seconds EEG   Comments: We will try to see her back in a week  Lungs:  [x]   Clear to auscultation               []  Other:   Heart:    [x]   Regular rhythm             []  irregular rhythm    [x]   Previous H&P reviewed, patient examined and there are NO CHANGES                 []   Previous H&P reviewed, patient examined and there are changes noted.   [name], MD 2/10/202110:55 AM

## 2019-11-15 NOTE — H&P (Signed)
Barbara Marks is an 86 y.o. female.   Chief Complaint: Patient with depression continues to be anxious depressed and down with little appetite HPI: History of recurrent severe depression  Past Medical History:  Diagnosis Date  . Anxiety   . CAD (coronary artery disease) Non-obstructive   a. 2004 Cath: nonobs dzs.  . DDD (degenerative disc disease)    a. with chronic right sided back pain - improves after seeing chiropractor.  . Depression    a. h/o ECT  . Diverticulitis   . Glaucoma   . HTN (hypertension)   . Hyperlipidemia, mixed   . Melanoma of skin ([place]) 2008   resected from Left arm   . Memory problem    "states memory issues"  . Nephrolithiasis   . Pancreatic cyst    a. [id number] Endoscopic U/S: nl UGI tract, 1-66mm pancreatic cysts, no masses/nodules.  . Premature ventricular contraction    a. managed with propafenone.  . Presence of permanent cardiac pacemaker   . Symptomatic bradycardia    a. s/p MDT PPM in 08/2005;  b. 02/2010 Gen change->MDT Adapta DC PPM, ser # [id number].    Past Surgical History:  Procedure Laterality Date  . APPENDECTOMY  1952  . CATARACT EXTRACTION, BILATERAL    . CHOLECYSTECTOMY  1983  . EUS N/A 06/22/2013   Procedure: UPPER ENDOSCOPIC ULTRASOUND (EUS) LINEAR;  Surgeon: Milus Banister, MD;  Location: [facility];  Service: Endoscopy;  Laterality: N/A;  . EYE SURGERY Bilateral    Cataract Extraction with IOL  . PACEMAKER INSERTION Left    [employer]  . TUMOR EXCISION     And nemamgeomas    Family History  Problem Relation Age of Onset  . Other Mother        died @ 71 - old age.  . Stroke Father        cva after cea in his 33's, died @ 20.  . Kidney cancer Brother    Social History:  reports that she has never smoked. She has never used smokeless tobacco. She reports that she does not drink alcohol or use drugs.  Allergies:  Allergies  Allergen Reactions  . Codeine     unknown  . Simvastatin Other (See Comments)    "leg cramps"     (Not in a hospital admission)   No results found for this or any previous visit (from the past 48 hour(s)). No results found.  Review of Systems  Constitutional: Negative.   HENT: Negative.   Eyes: Negative.   Respiratory: Negative.   Cardiovascular: Negative.   Gastrointestinal: Negative.   Musculoskeletal: Negative.   Skin: Negative.   Neurological: Negative.   Psychiatric/Behavioral: Positive for dysphoric mood.    Blood pressure (!) 150/90, pulse (!) 43, temperature 98.5 F (36.9 C), temperature source Oral, resp. rate 18, SpO2 100 %. Physical Exam  Nursing note and vitals reviewed. Constitutional: She appears well-developed and well-nourished.  HENT:  Head: Normocephalic and atraumatic.  Eyes: Pupils are equal, round, and reactive to light. Conjunctivae are normal.  Cardiovascular: Regular rhythm and normal heart sounds.  Respiratory: Effort normal.  GI: Soft.  Musculoskeletal:        General: Normal range of motion.     Cervical back: Normal range of motion.  Neurological: She is alert.  Skin: Skin is warm and dry.  Psychiatric: Her affect is blunt. Her speech is delayed. She is slowed. She exhibits a depressed mood. She expresses no suicidal ideation.  Assessment/Plan Patient will receive ECT today.  This is the second time in what is sort of a many index course.  We will try to schedule her again for next week to continue assessment.  [name], MD 11/15/2019, 10:53 AM

## 2019-11-15 NOTE — Anesthesia Postprocedure Evaluation (Signed)
Anesthesia Post Note  Patient: Barbara Marks  Procedure(s) Performed: ECT TX  Patient location during evaluation: PACU Anesthesia Type: General Level of consciousness: awake and alert Pain management: pain level controlled Vital Signs Assessment: post-procedure vital signs reviewed and stable Respiratory status: spontaneous breathing, nonlabored ventilation, respiratory function stable and patient connected to nasal cannula oxygen Cardiovascular status: blood pressure returned to baseline and stable Postop Assessment: no apparent nausea or vomiting Anesthetic complications: no     Last Vitals:  Vitals:   11/15/19 1134 11/15/19 1140  BP: (!) 157/72   Pulse: (!) 50   Resp: (!) 32   Temp:  (!) 36.1 C  SpO2: 98%     Last Pain:  Vitals:   11/15/19 1130  TempSrc:   PainSc: 0-No pain                 Precious Haws [name]

## 2019-11-15 NOTE — Transfer of Care (Signed)
Immediate Anesthesia Transfer of Care Note  Patient: Barbara Marks  Procedure(s) Performed: ECT TX  Patient Location: PACU  Anesthesia Type:General  Level of Consciousness: awake and alert   Airway & Oxygen Therapy: Patient Spontanous Breathing and Patient connected to face mask oxygen  Post-op Assessment: Report given to RN and Post -op Vital signs reviewed and stable  Post vital signs: Reviewed and stable  Last Vitals:  Vitals Value Taken Time  BP    Temp    Pulse 78 11/15/19 1118  Resp 27 11/15/19 1118  SpO2 98 % 11/15/19 1118  Vitals shown include unvalidated device data.  Last Pain:  Vitals:   11/15/19 1115  TempSrc:   PainSc: (P) 0-No pain         Complications: No apparent anesthesia complications

## 2019-11-15 NOTE — Anesthesia Procedure Notes (Signed)
Date/Time: 11/15/2019 11:06 AM Performed by: Allean Found, CRNA Pre-anesthesia Checklist: Patient identified, Emergency Drugs available, Suction available, Patient being monitored and Timeout performed Patient Re-evaluated:Patient Re-evaluated prior to induction Oxygen Delivery Method: Circle system utilized Induction Type: IV induction Placement Confirmation: positive ETCO2

## 2019-11-15 NOTE — Anesthesia Preprocedure Evaluation (Signed)
Anesthesia Evaluation  Patient identified by MRN, date of birth, ID band Patient awake    Reviewed: Allergy & Precautions, NPO status , Patient's Chart, lab work & pertinent test results  History of Anesthesia Complications Negative for: history of anesthetic complications  Airway Mallampati: II  TM Distance: >3 FB Neck ROM: Full    Dental  (+) Poor Dentition   Pulmonary neg pulmonary ROS, neg sleep apnea, neg COPD,    breath sounds clear to auscultation- rhonchi (-) wheezing      Cardiovascular Exercise Tolerance: Good hypertension, Pt. on medications + CAD  (-) Past MI, (-) Cardiac Stents and (-) CABG + pacemaker (put in for PVCs)  Rhythm:Regular Rate:Normal - Systolic murmurs and - Diastolic murmurs    Neuro/Psych neg Seizures PSYCHIATRIC DISORDERS Anxiety Depression negative neurological ROS     GI/Hepatic negative GI ROS, Neg liver ROS,   Endo/Other  negative endocrine ROSneg diabetes  Renal/GU Renal disease: hx of nephrolithiasis.     Musculoskeletal  (+) Arthritis ,   Abdominal (+) - obese,   Peds  Hematology  (+) anemia ,   Anesthesia Other Findings Past Medical History: No date: Anxiety Non-obstructive: CAD (coronary artery disease)     Comment:  a. 2004 Cath: nonobs dzs. No date: DDD (degenerative disc disease)     Comment:  a. with chronic right sided back pain - improves after               seeing chiropractor. No date: Depression     Comment:  a. h/o ECT No date: Diverticulitis No date: Glaucoma No date: HTN (hypertension) No date: Hyperlipidemia, mixed 2008: Melanoma of skin ([place])     Comment:  resected from Left arm  No date: Memory problem     Comment:  "states memory issues" No date: Nephrolithiasis No date: Pancreatic cyst     Comment:  a. [id number] Endoscopic U/S: nl UGI tract, 1-37mm pancreatic               cysts, no masses/nodules. No date: Premature ventricular contraction  Comment:  a. managed with propafenone. No date: Presence of permanent cardiac pacemaker No date: Symptomatic bradycardia     Comment:  a. s/p MDT PPM in 08/2005;  b. 02/2010 Gen change->MDT               Adapta DC PPM, ser # [id number].   Reproductive/Obstetrics                             Anesthesia Physical  Anesthesia Plan  ASA: II  Anesthesia Plan: General   Post-op Pain Management:    Induction: Intravenous  PONV Risk Score and Plan: 2 and Ondansetron  Airway Management Planned: Mask  Additional Equipment:   Intra-op Plan:   Post-operative Plan:   Informed Consent: I have reviewed the patients History and Physical, chart, labs and discussed the procedure including the risks, benefits and alternatives for the proposed anesthesia with the patient or authorized representative who has indicated his/her understanding and acceptance.     Dental advisory given  Plan Discussed with: CRNA and Anesthesiologist  Anesthesia Plan Comments:         Anesthesia Quick Evaluation

## 2019-11-20 ENCOUNTER — Other Ambulatory Visit: Payer: Self-pay

## 2019-11-20 ENCOUNTER — Other Ambulatory Visit
Admission: RE | Admit: 2019-11-20 | Discharge: 2019-11-20 | Disposition: A | Discharge status: Home / Self Care | Payer: PPO | Source: Ambulatory Visit | Attending: Psychiatry | Admitting: Psychiatry

## 2019-11-20 ENCOUNTER — Other Ambulatory Visit: Payer: Self-pay | Admitting: Psychiatry

## 2019-11-20 ENCOUNTER — Telehealth: Payer: Self-pay

## 2019-11-20 DIAGNOSIS — Z20822 Contact with and (suspected) exposure to covid-19: Secondary | ICD-10-CM | POA: Insufficient documentation

## 2019-11-20 DIAGNOSIS — Z01812 Encounter for preprocedural laboratory examination: Secondary | ICD-10-CM | POA: Insufficient documentation

## 2019-11-21 ENCOUNTER — Other Ambulatory Visit: Payer: Self-pay | Admitting: Psychiatry

## 2019-11-21 LAB — SARS CORONAVIRUS 2 (TAT 6-24 HRS): SARS Coronavirus 2: NEGATIVE

## 2019-11-22 ENCOUNTER — Ambulatory Visit: Payer: Self-pay | Admitting: Registered Nurse

## 2019-11-22 ENCOUNTER — Encounter (HOSPITAL_BASED_OUTPATIENT_CLINIC_OR_DEPARTMENT_OTHER)
Admission: RE | Admit: 2019-11-22 | Discharge: 2019-11-22 | Disposition: A | Discharge status: Home / Self Care | Payer: PPO | Source: Ambulatory Visit | Attending: Psychiatry | Admitting: Psychiatry

## 2019-11-22 ENCOUNTER — Encounter: Payer: Self-pay | Admitting: Registered Nurse

## 2019-11-22 ENCOUNTER — Other Ambulatory Visit: Payer: Self-pay

## 2019-11-22 DIAGNOSIS — F418 Other specified anxiety disorders: Secondary | ICD-10-CM | POA: Diagnosis not present

## 2019-11-22 DIAGNOSIS — F339 Major depressive disorder, recurrent, unspecified: Secondary | ICD-10-CM | POA: Diagnosis not present

## 2019-11-22 DIAGNOSIS — F332 Major depressive disorder, recurrent severe without psychotic features: Secondary | ICD-10-CM | POA: Diagnosis not present

## 2019-11-22 DIAGNOSIS — I10 Essential (primary) hypertension: Secondary | ICD-10-CM | POA: Diagnosis not present

## 2019-11-22 DIAGNOSIS — E782 Mixed hyperlipidemia: Secondary | ICD-10-CM | POA: Diagnosis not present

## 2019-11-22 DIAGNOSIS — I2510 Atherosclerotic heart disease of native coronary artery without angina pectoris: Secondary | ICD-10-CM | POA: Diagnosis not present

## 2019-11-22 MED ORDER — SODIUM CHLORIDE 0.9 % IV SOLN: INTRAVENOUS | Status: DC | PRN

## 2019-11-22 MED ORDER — SODIUM CHLORIDE 0.9 % IV SOLN
500.0000 mL | Freq: Once | INTRAVENOUS | Status: AC
  Administered 2019-11-22: 10:00:00 500 mL via INTRAVENOUS

## 2019-11-22 MED ORDER — SUCCINYLCHOLINE CHLORIDE 20 MG/ML IJ SOLN
INTRAMUSCULAR | Status: AC
  Filled 2019-11-22: qty 1

## 2019-11-22 MED ORDER — SUCCINYLCHOLINE CHLORIDE 20 MG/ML IJ SOLN
INTRAMUSCULAR | Status: DC | PRN
  Administered 2019-11-22: 90 mg via INTRAVENOUS

## 2019-11-22 MED ORDER — METHOHEXITAL SODIUM 100 MG/10ML IV SOSY
PREFILLED_SYRINGE | INTRAVENOUS | Status: DC | PRN
  Administered 2019-11-22: 80 mg via INTRAVENOUS

## 2019-11-22 MED ORDER — MIRTAZAPINE 15 MG PO TABS: 15.0000 mg | ORAL_TABLET | Freq: Every day | ORAL | 2 refills | Status: DC

## 2019-11-22 MED ORDER — LABETALOL HCL 5 MG/ML IV SOLN
INTRAVENOUS | Status: AC
  Filled 2019-11-22: qty 4

## 2019-11-22 NOTE — Transfer of Care (Signed)
Immediate Anesthesia Transfer of Care Note  Patient: Barbara Marks  Procedure(s) Performed: ECT TX  Patient Location: PACU  Anesthesia Type:General  Level of Consciousness: drowsy  Airway & Oxygen Therapy: Patient Spontanous Breathing and Patient connected to face mask oxygen  Post-op Assessment: Report given to RN and Post -op Vital signs reviewed and stable  Post vital signs: Reviewed and stable  Last Vitals:  Vitals Value Taken Time  BP 162/79 11/22/19 1110  Temp    Pulse 72 11/22/19 1111  Resp 19 11/22/19 1111  SpO2 100 % 11/22/19 1111  Vitals shown include unvalidated device data.  Last Pain:  Vitals:   11/22/19 0957  TempSrc:   PainSc: 0-No pain         Complications: No apparent anesthesia complications

## 2019-11-22 NOTE — Anesthesia Preprocedure Evaluation (Addendum)
Anesthesia Evaluation  Patient identified by MRN, date of birth, ID band Patient awake    Reviewed: Allergy & Precautions, H&P , NPO status , Patient's Chart, lab work & pertinent test results  Airway Mallampati: II  TM Distance: >3 FB Neck ROM: full    Dental  (+) Upper Dentures, Partial Lower   Pulmonary neg pulmonary ROS,           Cardiovascular hypertension, + CAD  + dysrhythmias (symptomatic bradycardia s/p pacemaker) + pacemaker      Neuro/Psych PSYCHIATRIC DISORDERS Anxiety Depression negative neurological ROS     GI/Hepatic negative GI ROS, Neg liver ROS,   Endo/Other  negative endocrine ROS  Renal/GU negative Renal ROS  negative genitourinary   Musculoskeletal  (+) Arthritis ,   Abdominal   Peds  Hematology negative hematology ROS (+)   Anesthesia Other Findings Past Medical History: No date: Anxiety Non-obstructive: CAD (coronary artery disease)     Comment:  a. 2004 Cath: nonobs dzs. No date: DDD (degenerative disc disease)     Comment:  a. with chronic right sided back pain - improves after               seeing chiropractor. No date: Depression     Comment:  a. h/o ECT No date: Diverticulitis No date: Glaucoma No date: HTN (hypertension) No date: Hyperlipidemia, mixed 2008: Melanoma of skin ([place])     Comment:  resected from Left arm  No date: Memory problem     Comment:  "states memory issues" No date: Nephrolithiasis No date: Pancreatic cyst     Comment:  a. [id number] Endoscopic U/S: nl UGI tract, 1-64mm pancreatic               cysts, no masses/nodules. No date: Premature ventricular contraction     Comment:  a. managed with propafenone. No date: Presence of permanent cardiac pacemaker No date: Symptomatic bradycardia     Comment:  a. s/p MDT PPM in 08/2005;  b. 02/2010 Gen change->MDT               Adapta DC PPM, ser # [id number].  Past Surgical History: 1952: APPENDECTOMY No date:  CATARACT EXTRACTION, BILATERAL 1983: CHOLECYSTECTOMY 06/22/2013: EUS; N/A     Comment:  Procedure: UPPER ENDOSCOPIC ULTRASOUND (EUS) LINEAR;                Surgeon: Milus Banister, MD;  Location: [facility];                Service: Endoscopy;  Laterality: N/A; No date: EYE SURGERY; Bilateral     Comment:  Cataract Extraction with IOL No date: PACEMAKER INSERTION; Left     Comment:  [employer] No date: TUMOR EXCISION     Comment:  And nemamgeomas     Reproductive/Obstetrics negative OB ROS                            Anesthesia Physical Anesthesia Plan  ASA: III  Anesthesia Plan: General   Post-op Pain Management:    Induction:   PONV Risk Score and Plan:   Airway Management Planned: Mask  Additional Equipment:   Intra-op Plan:   Post-operative Plan:   Informed Consent: I have reviewed the patients History and Physical, chart, labs and discussed the procedure including the risks, benefits and alternatives for the proposed anesthesia with the patient or authorized representative who has indicated his/her understanding and acceptance.  Dental Advisory Given  Plan Discussed with: Anesthesiologist  Anesthesia Plan Comments:         Anesthesia Quick Evaluation

## 2019-11-22 NOTE — H&P (Signed)
Barbara Marks is an 86 y.o. female.   Chief Complaint: Continues to feel a little bit depressed.  Appetite not much better.  No suicidal or psychotic thinking HPI: History of chronic recurrent depression  Past Medical History:  Diagnosis Date  . Anxiety   . CAD (coronary artery disease) Non-obstructive   a. 2004 Cath: nonobs dzs.  . DDD (degenerative disc disease)    a. with chronic right sided back pain - improves after seeing chiropractor.  . Depression    a. h/o ECT  . Diverticulitis   . Glaucoma   . HTN (hypertension)   . Hyperlipidemia, mixed   . Melanoma of skin ([place]) 2008   resected from Left arm   . Memory problem    "states memory issues"  . Nephrolithiasis   . Pancreatic cyst    a. [id number] Endoscopic U/S: nl UGI tract, 1-40mm pancreatic cysts, no masses/nodules.  . Premature ventricular contraction    a. managed with propafenone.  . Presence of permanent cardiac pacemaker   . Symptomatic bradycardia    a. s/p MDT PPM in 08/2005;  b. 02/2010 Gen change->MDT Adapta DC PPM, ser # [id number].    Past Surgical History:  Procedure Laterality Date  . APPENDECTOMY  1952  . CATARACT EXTRACTION, BILATERAL    . CHOLECYSTECTOMY  1983  . EUS N/A 06/22/2013   Procedure: UPPER ENDOSCOPIC ULTRASOUND (EUS) LINEAR;  Surgeon: Milus Banister, MD;  Location: [facility];  Service: Endoscopy;  Laterality: N/A;  . EYE SURGERY Bilateral    Cataract Extraction with IOL  . PACEMAKER INSERTION Left    [employer]  . TUMOR EXCISION     And nemamgeomas    Family History  Problem Relation Age of Onset  . Other Mother        died @ 81 - old age.  . Stroke Father        cva after cea in his 16's, died @ 66.  . Kidney cancer Brother    Social History:  reports that she has never smoked. She has never used smokeless tobacco. She reports that she does not drink alcohol or use drugs.  Allergies:  Allergies  Allergen Reactions  . Codeine     unknown  . Simvastatin Other (See Comments)     "leg cramps"    (Not in a hospital admission)   Results for orders placed or performed during the hospital encounter of 11/20/19 (from the past 48 hour(s))  SARS CORONAVIRUS 2 (TAT 6-24 HRS) Nasopharyngeal Nasopharyngeal Swab     Status: None   Collection Time: 11/20/19 12:02 PM   Specimen: Nasopharyngeal Swab  Result Value Ref Range   SARS Coronavirus 2 NEGATIVE NEGATIVE    Comment: (NOTE) SARS-CoV-2 target nucleic acids are NOT DETECTED. The SARS-CoV-2 RNA is generally detectable in upper and lower respiratory specimens during the acute phase of infection. Negative results do not preclude SARS-CoV-2 infection, do not rule out co-infections with other pathogens, and should not be used as the sole basis for treatment or other patient management decisions. Negative results must be combined with clinical observations, patient history, and epidemiological information. The expected result is Negative. Fact Sheet for Patients: SugarRoll.be Fact Sheet for Healthcare Providers: https://www.woods-mathews.com/ This test is not yet approved or cleared by the [place] FDA and  has been authorized for detection and/or diagnosis of SARS-CoV-2 by FDA under an Emergency Use Authorization (EUA). This EUA will remain  in effect (meaning this test can be used) for the  duration of the COVID-19 declaration under Section 56 4(b)(1) of the Act, 21 U.S.C. section 360bbb-3(b)(1), unless the authorization is terminated or revoked sooner. Performed at [facility], [place] [address], [place], [place] [address]    No results found.  Review of Systems  Constitutional: Negative.   HENT: Negative.   Eyes: Negative.   Respiratory: Negative.   Cardiovascular: Negative.   Gastrointestinal: Negative.   Musculoskeletal: Negative.   Skin: Negative.   Neurological: Negative.   Psychiatric/Behavioral: Positive for dysphoric mood and sleep disturbance.     Blood pressure (!) 151/78, pulse 75, temperature 98.1 F (36.7 C), temperature source Oral, resp. rate 18, SpO2 98 %. Physical Exam   Assessment/Plan Patient was offered and encouraged to come back on Friday or at least next week but has declined both of those options saying she only prefers to come back at her request.  I am going to ask her to switch antidepressants by discontinuing Cymbalta and starting Remeron 15 mg at night and will put a prescription in for that.  [name], MD 11/22/2019, 10:51 AM

## 2019-11-22 NOTE — Procedures (Signed)
ECT SERVICES Physician's Interval Evaluation & Treatment Note  Patient Identification: Barbara Marks MRN:  [id number] Date of Evaluation:  11/22/2019 TX #: 17.  3 in this current series.  MADRS:   MMSE:   P.E. Findings:  Patient continues underweight but physical no different  Psychiatric Interval Note:  She says she is feeling a little bit better but not all the way  Subjective:  Patient is a 86 y.o. female seen for evaluation for Electroconvulsive Therapy. Still not having much enjoyment and not eating well.  Treatment Summary:   []   Right Unilateral             [x]  Bilateral   % Energy : 1.0 ms 75%   Impedance: 2440 ohms  Seizure Energy Index: 7828 V squared  Postictal Suppression Index: 49%  Seizure Concordance Index: 87%  Medications  Pre Shock: Toradol 30 mg Brevital 80 mg succinylcholine 90 mg  Post Shock:    Seizure Duration: 26 seconds observed movement 56 seconds by EEG   Comments: I have recommended that she come back Friday or next week but she declines this recommendation and instead insist that she will call us.  I have changed her medicine slightly discontinuing her Cymbalta and replacing it with mirtazapine.  Lungs:  [x]   Clear to auscultation               []  Other:   Heart:    [x]   Regular rhythm             []  irregular rhythm    [x]   Previous H&P reviewed, patient examined and there are NO CHANGES                 []   Previous H&P reviewed, patient examined and there are changes noted.   [name], MD 2/17/202110:52 AM

## 2019-11-22 NOTE — Discharge Instructions (Signed)
1)  The drugs that you have been given will stay in your system until tomorrow so for the       next 24 hours you should not:  A. Drive an automobile  B. Make any legal decisions  C. Drink any alcoholic beverages  2)  You may resume your regular meals upon return home.  3)  A responsible adult must take you home.  Someone should stay with you for a few          hours, then be available by phone for the remainder of the treatment day.  4)  You May experience any of the following symptoms:  Headache, Nausea and a dry mouth (due to the medications you were given),  temporary memory loss and some confusion, or sore muscles (a warm bath  should help this).  If you you experience any of these symptoms let us know on                your return visit.  5)  Report any of the following: any acute discomfort, severe headache, or temperature        greater than 100.5 F.   Also report any unusual redness, swelling, drainage, or pain         at your IV site.    You may report Symptoms to:  [place] at [facility]          Phone: [phone], ECT Department           or Dr. Prescott Gum office [phone]  6)  Your next ECT Treatment is when you call to make an appointment  We will call 2 days prior to your scheduled appointment for arrival times.  7)  Nothing to eat or drink after midnight the night before your procedure.  8)  Take    With a sip of water the morning of your procedure.  9)  Other Instructions: Call [phone] to cancel the morning of your procedure due         to illness or emergency.  10) We will call within 72 hours to assess how you are feeling.

## 2019-11-23 NOTE — Anesthesia Postprocedure Evaluation (Signed)
Anesthesia Post Note  Patient: Barbara Marks  Procedure(s) Performed: ECT TX  Patient location during evaluation: PACU Anesthesia Type: General Level of consciousness: awake and alert Pain management: pain level controlled Vital Signs Assessment: post-procedure vital signs reviewed and stable Respiratory status: spontaneous breathing, nonlabored ventilation and respiratory function stable Cardiovascular status: blood pressure returned to baseline and stable Postop Assessment: no apparent nausea or vomiting Anesthetic complications: no     Last Vitals:  Vitals:   11/22/19 1120 11/22/19 1125  BP:  (!) 161/79  Pulse:  79  Resp:  18  Temp:  (!) 36.3 C  SpO2: 100% 99%    Last Pain:  Vitals:   11/22/19 1128  TempSrc:   PainSc: 0-No pain                 Tera Mater

## 2020-01-30 ENCOUNTER — Other Ambulatory Visit: Payer: Self-pay | Admitting: Psychiatry

## 2020-02-02 ENCOUNTER — Telehealth: Payer: Self-pay

## 2020-02-06 ENCOUNTER — Other Ambulatory Visit: Payer: Self-pay

## 2020-02-06 ENCOUNTER — Other Ambulatory Visit: Payer: Self-pay | Admitting: Psychiatry

## 2020-02-06 ENCOUNTER — Other Ambulatory Visit
Admission: RE | Admit: 2020-02-06 | Discharge: 2020-02-06 | Disposition: A | Discharge status: Home / Self Care | Payer: PPO | Source: Ambulatory Visit | Attending: Psychiatry | Admitting: Psychiatry

## 2020-02-06 DIAGNOSIS — Z01812 Encounter for preprocedural laboratory examination: Secondary | ICD-10-CM | POA: Diagnosis not present

## 2020-02-06 DIAGNOSIS — Z20822 Contact with and (suspected) exposure to covid-19: Secondary | ICD-10-CM | POA: Diagnosis not present

## 2020-02-06 LAB — SARS CORONAVIRUS 2 (TAT 6-24 HRS): SARS Coronavirus 2: NEGATIVE

## 2020-02-07 ENCOUNTER — Other Ambulatory Visit: Payer: Self-pay

## 2020-02-07 ENCOUNTER — Ambulatory Visit: Payer: Self-pay | Admitting: Anesthesiology

## 2020-02-07 ENCOUNTER — Encounter
Admission: RE | Admit: 2020-02-07 | Discharge: 2020-02-07 | Disposition: A | Discharge status: Home / Self Care | Payer: PPO | Source: Ambulatory Visit | Attending: Psychiatry | Admitting: Psychiatry

## 2020-02-07 ENCOUNTER — Encounter: Payer: Self-pay | Admitting: Anesthesiology

## 2020-02-07 DIAGNOSIS — F419 Anxiety disorder, unspecified: Secondary | ICD-10-CM | POA: Insufficient documentation

## 2020-02-07 DIAGNOSIS — I10 Essential (primary) hypertension: Secondary | ICD-10-CM | POA: Insufficient documentation

## 2020-02-07 DIAGNOSIS — I2510 Atherosclerotic heart disease of native coronary artery without angina pectoris: Secondary | ICD-10-CM | POA: Diagnosis not present

## 2020-02-07 DIAGNOSIS — H409 Unspecified glaucoma: Secondary | ICD-10-CM | POA: Insufficient documentation

## 2020-02-07 DIAGNOSIS — E782 Mixed hyperlipidemia: Secondary | ICD-10-CM | POA: Insufficient documentation

## 2020-02-07 DIAGNOSIS — Z950 Presence of cardiac pacemaker: Secondary | ICD-10-CM | POA: Insufficient documentation

## 2020-02-07 DIAGNOSIS — F339 Major depressive disorder, recurrent, unspecified: Secondary | ICD-10-CM | POA: Insufficient documentation

## 2020-02-07 DIAGNOSIS — Z7901 Long term (current) use of anticoagulants: Secondary | ICD-10-CM | POA: Diagnosis not present

## 2020-02-07 DIAGNOSIS — Z79899 Other long term (current) drug therapy: Secondary | ICD-10-CM | POA: Insufficient documentation

## 2020-02-07 DIAGNOSIS — F332 Major depressive disorder, recurrent severe without psychotic features: Secondary | ICD-10-CM | POA: Diagnosis not present

## 2020-02-07 DIAGNOSIS — F418 Other specified anxiety disorders: Secondary | ICD-10-CM | POA: Diagnosis not present

## 2020-02-07 MED ORDER — SUCCINYLCHOLINE CHLORIDE 20 MG/ML IJ SOLN
INTRAMUSCULAR | Status: DC | PRN
  Administered 2020-02-07: 90 mg via INTRAVENOUS

## 2020-02-07 MED ORDER — METHOHEXITAL SODIUM 100 MG/10ML IV SOSY
PREFILLED_SYRINGE | INTRAVENOUS | Status: DC | PRN
  Administered 2020-02-07: 80 mg via INTRAVENOUS

## 2020-02-07 MED ORDER — SODIUM CHLORIDE 0.9 % IV SOLN
500.0000 mL | Freq: Once | INTRAVENOUS | Status: AC
  Administered 2020-02-07: 500 mL via INTRAVENOUS

## 2020-02-07 MED ORDER — SODIUM CHLORIDE 0.9 % IV SOLN: INTRAVENOUS | Status: DC | PRN

## 2020-02-07 MED ORDER — FENTANYL CITRATE (PF) 100 MCG/2ML IJ SOLN: 25.0000 ug | INTRAMUSCULAR | Status: DC | PRN

## 2020-02-07 MED ORDER — KETOROLAC TROMETHAMINE 30 MG/ML IJ SOLN
INTRAMUSCULAR | Status: AC
  Administered 2020-02-07: 30 mg via INTRAVENOUS
  Filled 2020-02-07: qty 1

## 2020-02-07 MED ORDER — KETOROLAC TROMETHAMINE 30 MG/ML IJ SOLN: 30.0000 mg | Freq: Once | INTRAMUSCULAR | Status: AC

## 2020-02-07 NOTE — Procedures (Signed)
ECT SERVICES Physician's Interval Evaluation & Treatment Note  Patient Identification: Barbara Marks MRN:  [id number] Date of Evaluation:  02/07/2020 TX #: 18  MADRS:   MMSE:   P.E. Findings:  Patient looks a little bit frailer but there is no specific change to findings  Psychiatric Interval Note:  Mood is a little dysphoric and she appears a little grumpier although she denies suicidal or homicidal ideation and appears to still be taking care of her self  Subjective:  Patient is a 86 y.o. female seen for evaluation for Electroconvulsive Therapy. A little bit more down decreased appetite.  Treatment Summary:   []   Right Unilateral             [x]  Bilateral   % Energy : 1.0 ms 75%   Impedance: 2800 ohms  Seizure Energy Index: 9587 V squared  Postictal Suppression Index: 78%  Seizure Concordance Index: 93%  Medications  Pre Shock: Toradol 30 mg Brevital 80 mg succinylcholine 90 mg  Post Shock:    Seizure Duration: 42 seconds EMG 102 seconds EEG   Comments: Patient as usual persists and not wanting to schedule another follow-up but to call us as needed.  I reviewed her medicines with her and asked her to please continue taking them.  Reviewed symptoms of depression and encouraged her to call me or the ECT office if things seem to be getting worse at all.  She is agreeable to this and shows understanding of the plan.  Lungs:  [x]   Clear to auscultation               []  Other:   Heart:    [x]   Regular rhythm             []  irregular rhythm    [x]   Previous H&P reviewed, patient examined and there are NO CHANGES                 []   Previous H&P reviewed, patient examined and there are changes noted.   [name], MD 5/5/202111:31 AM

## 2020-02-07 NOTE — H&P (Signed)
Barbara Marks is an 86 y.o. female.   Chief Complaint: Patient reports that she has been feeling a little more down.  One of her chief indicators is that her appetite has decreased HPI: History of recurrent severe depression  Past Medical History:  Diagnosis Date  . Anxiety   . CAD (coronary artery disease) Non-obstructive   a. 2004 Cath: nonobs dzs.  . DDD (degenerative disc disease)    a. with chronic right sided back pain - improves after seeing chiropractor.  . Depression    a. h/o ECT  . Diverticulitis   . Glaucoma   . HTN (hypertension)   . Hyperlipidemia, mixed   . Melanoma of skin ([place]) 2008   resected from Left arm   . Memory problem    "states memory issues"  . Nephrolithiasis   . Pancreatic cyst    a. [id number] Endoscopic U/S: nl UGI tract, 1-66mm pancreatic cysts, no masses/nodules.  . Premature ventricular contraction    a. managed with propafenone.  . Presence of permanent cardiac pacemaker   . Symptomatic bradycardia    a. s/p MDT PPM in 08/2005;  b. 02/2010 Gen change->MDT Adapta DC PPM, ser # [id number].    Past Surgical History:  Procedure Laterality Date  . APPENDECTOMY  1952  . CATARACT EXTRACTION, BILATERAL    . CHOLECYSTECTOMY  1983  . EUS N/A 06/22/2013   Procedure: UPPER ENDOSCOPIC ULTRASOUND (EUS) LINEAR;  Surgeon: Milus Banister, MD;  Location: [facility];  Service: Endoscopy;  Laterality: N/A;  . EYE SURGERY Bilateral    Cataract Extraction with IOL  . PACEMAKER INSERTION Left    [employer]  . TUMOR EXCISION     And nemamgeomas    Family History  Problem Relation Age of Onset  . Other Mother        died @ 74 - old age.  . Stroke Father        cva after cea in his 58's, died @ 63.  . Kidney cancer Brother    Social History:  reports that she has never smoked. She has never used smokeless tobacco. She reports that she does not drink alcohol or use drugs.  Allergies:  Allergies  Allergen Reactions  . Codeine     unknown  . Simvastatin  Other (See Comments)    "leg cramps"    (Not in a hospital admission)   Results for orders placed or performed during the hospital encounter of 02/06/20 (from the past 48 hour(s))  SARS CORONAVIRUS 2 (TAT 6-24 HRS) Nasopharyngeal Nasopharyngeal Swab     Status: None   Collection Time: 02/06/20 11:17 AM   Specimen: Nasopharyngeal Swab  Result Value Ref Range   SARS Coronavirus 2 NEGATIVE NEGATIVE    Comment: (NOTE) SARS-CoV-2 target nucleic acids are NOT DETECTED. The SARS-CoV-2 RNA is generally detectable in upper and lower respiratory specimens during the acute phase of infection. Negative results do not preclude SARS-CoV-2 infection, do not rule out co-infections with other pathogens, and should not be used as the sole basis for treatment or other patient management decisions. Negative results must be combined with clinical observations, patient history, and epidemiological information. The expected result is Negative. Fact Sheet for Patients: SugarRoll.be Fact Sheet for Healthcare Providers: https://www.woods-mathews.com/ This test is not yet approved or cleared by the [place] FDA and  has been authorized for detection and/or diagnosis of SARS-CoV-2 by FDA under an Emergency Use Authorization (EUA). This EUA will remain  in effect (meaning this test  can be used) for the duration of the COVID-19 declaration under Section 56 4(b)(1) of the Act, 21 U.S.C. section 360bbb-3(b)(1), unless the authorization is terminated or revoked sooner. Performed at [facility], [place] [address] [place], [place] [address]    No results found.  Review of Systems  Constitutional: Negative.   HENT: Negative.   Eyes: Negative.   Respiratory: Negative.   Cardiovascular: Negative.   Gastrointestinal: Negative.   Musculoskeletal: Negative.   Skin: Negative.   Neurological: Negative.   Psychiatric/Behavioral: Positive for dysphoric mood.  Negative for suicidal ideas.    Blood pressure (!) 145/75, pulse 81, temperature (!) 97.2 F (36.2 C), resp. rate (!) 26, height 5\' 6"  (1.676 m), weight 66.2 kg, SpO2 99 %. Physical Exam  Nursing note and vitals reviewed. Constitutional: She appears well-developed and well-nourished.  HENT:  Head: Normocephalic and atraumatic.  Eyes: Pupils are equal, round, and reactive to light. Conjunctivae are normal.  Cardiovascular: Regular rhythm and normal heart sounds.  Respiratory: Effort normal.  GI: Soft.  Musculoskeletal:        General: Normal range of motion.     Cervical back: Normal range of motion.  Neurological: She is alert.  Skin: Skin is warm and dry.  Psychiatric: Her speech is normal and behavior is normal. Judgment normal. Her affect is blunt. Thought content is not paranoid. She expresses no suicidal ideation. She exhibits abnormal recent memory.     Assessment/Plan The pandemic has of course really gotten her down.  She has none of her usual social activity.  She has not gotten her vaccine yet and says that she is inclined not to do so because flu vaccines make her feel bad.  I tried to prevail upon her to go ahead and get the vaccine but she of course is not very convinced able.  Patient will be given ECT today and as usual is resistant to making a plan for specific follow-up and prefers to simply call us.  [name], MD 02/07/2020, 11:29 AM

## 2020-02-07 NOTE — Transfer of Care (Signed)
Immediate Anesthesia Transfer of Care Note  Patient: Barbara Marks  Procedure(s) Performed: ECT TX  Patient Location: PACU  Anesthesia Type:General  Level of Consciousness: drowsy and cooperative  Airway & Oxygen Therapy: Patient Spontanous Breathing and Patient connected to nasal cannula oxygen  Post-op Assessment: Report given to RN and Post -op Vital signs reviewed and stable  Post vital signs: Reviewed and stable  Last Vitals:  Vitals Value Taken Time  BP 153/62 02/07/20 1101  Temp    Pulse 36 02/07/20 1102  Resp 22 02/07/20 1102  SpO2 100 % 02/07/20 1102  Vitals shown include unvalidated device data.  Last Pain:  Vitals:   02/07/20 0918  TempSrc:   PainSc: 0-No pain         Complications: No apparent anesthesia complications

## 2020-02-07 NOTE — Anesthesia Preprocedure Evaluation (Signed)
Anesthesia Evaluation  Patient identified by MRN, date of birth, ID band Patient awake    Reviewed: Allergy & Precautions, H&P , NPO status , Patient's Chart, lab work & pertinent test results, reviewed documented beta blocker date and time   Airway Mallampati: II   Neck ROM: full    Dental  (+) Poor Dentition   Pulmonary neg pulmonary ROS,    Pulmonary exam normal        Cardiovascular Exercise Tolerance: Poor hypertension, On Medications + CAD  Normal cardiovascular exam+ dysrhythmias + pacemaker  Rhythm:regular Rate:Normal     Neuro/Psych PSYCHIATRIC DISORDERS Anxiety Depression negative neurological ROS     GI/Hepatic negative GI ROS, Neg liver ROS,   Endo/Other  negative endocrine ROS  Renal/GU Renal disease  negative genitourinary   Musculoskeletal   Abdominal   Peds  Hematology  (+) Blood dyscrasia, anemia ,   Anesthesia Other Findings Past Medical History: No date: Anxiety Non-obstructive: CAD (coronary artery disease)     Comment:  a. 2004 Cath: nonobs dzs. No date: DDD (degenerative disc disease)     Comment:  a. with chronic right sided back pain - improves after               seeing chiropractor. No date: Depression     Comment:  a. h/o ECT No date: Diverticulitis No date: Glaucoma No date: HTN (hypertension) No date: Hyperlipidemia, mixed 2008: Melanoma of skin ([place])     Comment:  resected from Left arm  No date: Memory problem     Comment:  "states memory issues" No date: Nephrolithiasis No date: Pancreatic cyst     Comment:  a. [id number] Endoscopic U/S: nl UGI tract, 1-20mm pancreatic               cysts, no masses/nodules. No date: Premature ventricular contraction     Comment:  a. managed with propafenone. No date: Presence of permanent cardiac pacemaker No date: Symptomatic bradycardia     Comment:  a. s/p MDT PPM in 08/2005;  b. 02/2010 Gen change->MDT               Adapta DC PPM, ser #  [id number] H. Past Surgical History: 1952: APPENDECTOMY No date: CATARACT EXTRACTION, BILATERAL 1983: CHOLECYSTECTOMY 06/22/2013: EUS; N/A     Comment:  Procedure: UPPER ENDOSCOPIC ULTRASOUND (EUS) LINEAR;                Surgeon: Milus Banister, MD;  Location: [facility];                Service: Endoscopy;  Laterality: N/A; No date: EYE SURGERY; Bilateral     Comment:  Cataract Extraction with IOL No date: PACEMAKER INSERTION; Left     Comment:  [employer] No date: TUMOR EXCISION     Comment:  And nemamgeomas BMI    Body Mass Index: 23.56 kg/m     Reproductive/Obstetrics negative OB ROS                             Anesthesia Physical Anesthesia Plan  ASA: III  Anesthesia Plan: General   Post-op Pain Management:    Induction:   PONV Risk Score and Plan:   Airway Management Planned:   Additional Equipment:   Intra-op Plan:   Post-operative Plan:   Informed Consent: I have reviewed the patients History and Physical, chart, labs and discussed the procedure including the risks, benefits and alternatives for the proposed  anesthesia with the patient or authorized representative who has indicated his/her understanding and acceptance.     Dental Advisory Given  Plan Discussed with: CRNA  Anesthesia Plan Comments:         Anesthesia Quick Evaluation

## 2020-02-07 NOTE — Discharge Instructions (Signed)
1)  The drugs that you have been given will stay in your system until tomorrow so for the       next 24 hours you should not:  A. Drive an automobile  B. Make any legal decisions  C. Drink any alcoholic beverages  2)  You may resume your regular meals upon return home.  3)  A responsible adult must take you home.  Someone should stay with you for a few          hours, then be available by phone for the remainder of the treatment day.  4)  You May experience any of the following symptoms:  Headache, Nausea and a dry mouth (due to the medications you were given),  temporary memory loss and some confusion, or sore muscles (a warm bath  should help this).  If you you experience any of these symptoms let us know on                your return visit.  5)  Report any of the following: any acute discomfort, severe headache, or temperature        greater than 100.5 F.   Also report any unusual redness, swelling, drainage, or pain         at your IV site.    You may report Symptoms to:  [place] at [facility]          Phone: [phone], ECT Department           or Dr. Prescott Gum office [phone]  6)  Your next ECT Treatment is Friday May 7 at 9:00  We will call 2 days prior to your scheduled appointment for arrival times.  7)  Nothing to eat or drink after midnight the night before your procedure.  8)  Take    With a sip of water the morning of your procedure.  9)  Other Instructions: Call [phone] to cancel the morning of your procedure due         to illness or emergency.  10) We will call within 72 hours to assess how you are feeling.

## 2020-02-12 NOTE — Anesthesia Postprocedure Evaluation (Signed)
Anesthesia Post Note  Patient: Barbara Marks  Procedure(s) Performed: ECT TX  Patient location during evaluation: PACU Anesthesia Type: General Level of consciousness: awake and alert Pain management: pain level controlled Vital Signs Assessment: post-procedure vital signs reviewed and stable Respiratory status: spontaneous breathing, nonlabored ventilation, respiratory function stable and patient connected to nasal cannula oxygen Cardiovascular status: blood pressure returned to baseline and stable Postop Assessment: no apparent nausea or vomiting Anesthetic complications: no     Last Vitals:  Vitals:   02/07/20 1120 02/07/20 1134  BP: (!) 145/75 (!) 152/62  Pulse: 81 86  Resp: (!) 26 (!) 24  Temp: (!) 36.2 C (!) 36.2 C  SpO2: 99%     Last Pain:  Vitals:   02/07/20 1134  TempSrc: Oral  PainSc: 0-No pain                 Molli Barrows

## 2020-03-07 DIAGNOSIS — H353131 Nonexudative age-related macular degeneration, bilateral, early dry stage: Secondary | ICD-10-CM | POA: Diagnosis not present

## 2020-03-16 ENCOUNTER — Other Ambulatory Visit: Payer: Self-pay | Admitting: Psychiatry

## 2020-04-10 ENCOUNTER — Telehealth: Payer: Self-pay

## 2020-04-10 NOTE — Telephone Encounter (Signed)
Pt left /vm that recently she has been reading some bad things about amlodipine and pt wants to know Dr Verda Cumins thoughts about taking amlodipine. Pt had medicare wellness 08/01/2019.

## 2020-04-11 NOTE — Telephone Encounter (Signed)
Patient returned call.  Please call patient back at  [phone].

## 2020-04-11 NOTE — Telephone Encounter (Signed)
Please call patient to see what her concerns regarding Amlodipine are.   I have several patients on this medication and they do well, it would be helpful to know what she is worried about.

## 2020-04-11 NOTE — Telephone Encounter (Signed)
Left message on pt's vm to call me back in regards to her concerns.

## 2020-04-15 ENCOUNTER — Telehealth: Payer: Self-pay

## 2020-04-15 NOTE — Telephone Encounter (Signed)
I spoke to Barbara Marks and she said after she did some further research she found that the amlodipine has been around for awhile, with positive reviews, so she is fine with it.

## 2020-04-15 NOTE — Telephone Encounter (Signed)
error 

## 2020-05-04 ENCOUNTER — Other Ambulatory Visit: Payer: Self-pay | Admitting: Psychiatry

## 2020-05-06 ENCOUNTER — Ambulatory Visit: Payer: PPO | Admitting: Family Medicine

## 2020-05-21 ENCOUNTER — Ambulatory Visit: Payer: PPO | Admitting: Dermatology

## 2020-05-21 ENCOUNTER — Other Ambulatory Visit: Payer: Self-pay

## 2020-05-21 DIAGNOSIS — L237 Allergic contact dermatitis due to plants, except food: Secondary | ICD-10-CM

## 2020-05-21 MED ORDER — CLOBETASOL PROPIONATE 0.05 % EX CREA: 1.0000 "application " | TOPICAL_CREAM | Freq: Two times a day (BID) | CUTANEOUS | 1 refills | Status: DC

## 2020-05-21 NOTE — Progress Notes (Signed)
   Follow-Up Visit   Subjective  Barbara Marks is a 86 y.o. female who presents for the following: Rash.  Patient presents today for rash on bilateral hands for 2 days now, very itchy and hurts. Patient believe she has shingles.  She does recall pulling weeds on Saturday out in yard.  No pets.  The following portions of the chart were reviewed this encounter and updated as appropriate:      Review of Systems:  No other skin or systemic complaints except as noted in HPI or Assessment and Plan.  Objective  Well appearing patient in no apparent distress; mood and affect are within normal limits.  A focused examination was performed including upper extremities, including the arms, hands, fingers, and fingernails. Relevant physical exam findings are noted in the Assessment and Plan.  Objective  Right Forearm, hand, L wrist: Scaly erythematous papules and plaques +/- edema and vesiculation.    Assessment & Plan  Allergic contact dermatitis due to plants, except food Right Forearm, hand, L wrist  Start Clobetasol cream apply to affected areas on b/l lower arms twice daily until itchy rash cleared  Recommend  OTC Domeboro's compresses twice daily for 15-20 min to help dry up blisters followed by the clobetasol cream  clobetasol cream (TEMOVATE) 0.05 % - Right Forearm, hand, L wrist  Return in about 2 weeks (around 06/04/2020) for Contact Derm.   Marene Lenz, CMA, am acting as scribe for Brendolyn Patty, MD .  Documentation: I have reviewed the above documentation for accuracy and completeness, and I agree with the above.  Brendolyn Patty MD

## 2020-05-21 NOTE — Patient Instructions (Addendum)
Recommend daily broad spectrum sunscreen SPF 30+ to sun-exposed areas, reapply every 2 hours as needed. Call for new or changing lesions.  Recommend  OTC Domeboro's compresses once to twice daily to help dry up blisters.  Start Clobetasol cream apply to affected areas on b/l lower arms twice daily as needed

## 2020-05-23 ENCOUNTER — Telehealth: Payer: Self-pay

## 2020-05-23 MED ORDER — PREDNISONE 5 MG PO TABS
ORAL_TABLET | ORAL | 0 refills | Status: DC
Start: 2020-05-23 — End: 2020-08-15

## 2020-05-23 NOTE — Telephone Encounter (Addendum)
   Continue using clobetasol ointment prn itch  Risks of prednisone taper discussed including mood irritability, insomnia, weight gain, stomach ulcers, increased risk of infection, increased blood sugar (diabetes), hypertension, osteoporosis with long-term or frequent use, and rare risk of avascular necrosis of the hip.   Start a 2 week taper of Prednisone take as prescribed

## 2020-05-28 NOTE — Telephone Encounter (Signed)
Patient presented with worsening allergic contact dermatitis despite clobetasol use. She inquires about prednisone.   Reviewed risks with her as per documentation by Donzetta Kohut. Reviewed risks related to COVID in depth including risk of severe infection or death. She is not vaccinated.   I strongly recommended vaccination and reviewed safety but she advised her daughter follows Dr. Jorge Ny and knows two people in their 58s who died from the vaccine and she is pretty decided she won't get it.   She does report she stays home and takes precautions to avoid getting COVID.  All questions answered.

## 2020-06-05 ENCOUNTER — Emergency Department (HOSPITAL_COMMUNITY)
Admission: EM | Admit: 2020-06-05 | Discharge: 2020-06-05 | Disposition: A | Discharge status: Home / Self Care | Payer: PPO | Attending: Emergency Medicine | Admitting: Emergency Medicine

## 2020-06-05 ENCOUNTER — Encounter (HOSPITAL_COMMUNITY): Payer: Self-pay

## 2020-06-05 ENCOUNTER — Ambulatory Visit: Payer: PPO | Admitting: Dermatology

## 2020-06-05 DIAGNOSIS — Z0389 Encounter for observation for other suspected diseases and conditions ruled out: Secondary | ICD-10-CM | POA: Diagnosis not present

## 2020-06-05 DIAGNOSIS — Z5321 Procedure and treatment not carried out due to patient leaving prior to being seen by health care provider: Secondary | ICD-10-CM | POA: Diagnosis not present

## 2020-06-05 NOTE — ED Notes (Signed)
Did not respond when called for vitals check

## 2020-06-05 NOTE — ED Notes (Signed)
Called for recheck VS x3, no response 

## 2020-06-05 NOTE — ED Triage Notes (Signed)
Pt arrives POV requesting ECT therapy. Reports she has had this therapy for 40 years. Pt reports they advised her to come here for same

## 2020-06-11 ENCOUNTER — Other Ambulatory Visit: Payer: Self-pay | Admitting: Psychiatry

## 2020-06-11 DIAGNOSIS — F331 Major depressive disorder, recurrent, moderate: Secondary | ICD-10-CM

## 2020-06-11 DIAGNOSIS — Z1211 Encounter for screening for malignant neoplasm of colon: Secondary | ICD-10-CM

## 2020-06-14 ENCOUNTER — Other Ambulatory Visit
Admission: RE | Admit: 2020-06-14 | Discharge: 2020-06-14 | Disposition: A | Discharge status: Home / Self Care | Payer: PPO | Source: Ambulatory Visit | Attending: Family Medicine | Admitting: Family Medicine

## 2020-06-14 ENCOUNTER — Other Ambulatory Visit: Payer: Self-pay

## 2020-06-14 DIAGNOSIS — Z01812 Encounter for preprocedural laboratory examination: Secondary | ICD-10-CM | POA: Insufficient documentation

## 2020-06-14 DIAGNOSIS — Z20822 Contact with and (suspected) exposure to covid-19: Secondary | ICD-10-CM | POA: Diagnosis not present

## 2020-06-15 ENCOUNTER — Other Ambulatory Visit: Payer: Self-pay | Admitting: Psychiatry

## 2020-06-15 LAB — SARS CORONAVIRUS 2 (TAT 6-24 HRS): SARS Coronavirus 2: NEGATIVE

## 2020-06-17 ENCOUNTER — Ambulatory Visit: Payer: Self-pay | Admitting: Anesthesiology

## 2020-06-17 ENCOUNTER — Other Ambulatory Visit: Payer: PPO

## 2020-06-17 ENCOUNTER — Other Ambulatory Visit: Payer: Self-pay

## 2020-06-17 ENCOUNTER — Encounter: Payer: Self-pay | Admitting: Anesthesiology

## 2020-06-17 ENCOUNTER — Encounter
Admission: RE | Admit: 2020-06-17 | Discharge: 2020-06-17 | Disposition: A | Discharge status: Home / Self Care | Payer: PPO | Source: Ambulatory Visit | Attending: Psychiatry | Admitting: Psychiatry

## 2020-06-17 DIAGNOSIS — Z885 Allergy status to narcotic agent status: Secondary | ICD-10-CM | POA: Diagnosis not present

## 2020-06-17 DIAGNOSIS — F332 Major depressive disorder, recurrent severe without psychotic features: Secondary | ICD-10-CM | POA: Diagnosis not present

## 2020-06-17 DIAGNOSIS — Z950 Presence of cardiac pacemaker: Secondary | ICD-10-CM | POA: Insufficient documentation

## 2020-06-17 DIAGNOSIS — Z85820 Personal history of malignant melanoma of skin: Secondary | ICD-10-CM | POA: Diagnosis not present

## 2020-06-17 DIAGNOSIS — I2510 Atherosclerotic heart disease of native coronary artery without angina pectoris: Secondary | ICD-10-CM | POA: Insufficient documentation

## 2020-06-17 DIAGNOSIS — F329 Major depressive disorder, single episode, unspecified: Secondary | ICD-10-CM | POA: Diagnosis not present

## 2020-06-17 DIAGNOSIS — I10 Essential (primary) hypertension: Secondary | ICD-10-CM | POA: Diagnosis not present

## 2020-06-17 DIAGNOSIS — E782 Mixed hyperlipidemia: Secondary | ICD-10-CM | POA: Diagnosis not present

## 2020-06-17 DIAGNOSIS — M1990 Unspecified osteoarthritis, unspecified site: Secondary | ICD-10-CM | POA: Diagnosis not present

## 2020-06-17 MED ORDER — SODIUM CHLORIDE 0.9 % IV SOLN: INTRAVENOUS | Status: DC | PRN

## 2020-06-17 MED ORDER — PROPOFOL 10 MG/ML IV BOLUS
INTRAVENOUS | Status: DC | PRN
  Administered 2020-06-17: 25 mg via INTRAVENOUS

## 2020-06-17 MED ORDER — KETAMINE HCL 10 MG/ML IJ SOLN
INTRAMUSCULAR | Status: DC | PRN
  Administered 2020-06-17: 50 mg via INTRAVENOUS

## 2020-06-17 MED ORDER — SUCCINYLCHOLINE CHLORIDE 20 MG/ML IJ SOLN
INTRAMUSCULAR | Status: DC | PRN
  Administered 2020-06-17: 90 mg via INTRAVENOUS

## 2020-06-17 MED ORDER — SODIUM CHLORIDE 0.9 % IV SOLN: 500.0000 mL | Freq: Once | INTRAVENOUS | Status: DC

## 2020-06-17 NOTE — Anesthesia Postprocedure Evaluation (Signed)
Anesthesia Post Note  Patient: Barbara Marks  Procedure(s) Performed: ECT TX  Patient location during evaluation: PACU Anesthesia Type: General Level of consciousness: awake and alert Pain management: pain level controlled Vital Signs Assessment: post-procedure vital signs reviewed and stable Respiratory status: spontaneous breathing, nonlabored ventilation and respiratory function stable Cardiovascular status: blood pressure returned to baseline and stable Postop Assessment: no signs of nausea or vomiting Anesthetic complications: no   No complications documented.   Last Vitals:  Vitals:   06/17/20 1238 06/17/20 1247  BP: (!) 179/77 135/69  Pulse: 82 (!) 42  Resp: 16 (!) 23  Temp:    SpO2: 95% 97%    Last Pain: There were no vitals filed for this visit.               Nikisha Fleece

## 2020-06-17 NOTE — Procedures (Signed)
ECT SERVICES Physician's Interval Evaluation & Treatment Note  Patient Identification: Barbara Marks MRN:  [id number] Date of Evaluation:  06/17/2020 TX #: 19  MADRS:   MMSE:   P.E. Findings:  She looks like she is lost weight.  Looks rundown and negative and flat  Psychiatric Interval Note:  More depressed not eating well.  Very negative  Subjective:  Patient is a 86 y.o. female seen for evaluation for Electroconvulsive Therapy. Not suicidal not psychotic but quite depressed  Treatment Summary:   []   Right Unilateral             [x]  Bilateral   % Energy : 1.0 ms 75%   Impedance: 1750 ohms  Seizure Energy Index: 7624 V squared  Postictal Suppression Index: 58%  Seizure Concordance Index: 90%  Medications  Pre Shock: Toradol 30 mg Brevital 80 mg succinylcholine 90 mg  Post Shock:    Seizure Duration: 41 seconds EMG 68 seconds EEG   Comments: Patient is quite depressed.  Of course I explained to her that this is what happens when she will not schedule regular maintenance but waits until the last minute to call us.  We will try to see her back Wednesday  Lungs:  [x]   Clear to auscultation               []  Other:   Heart:    [x]   Regular rhythm             []  irregular rhythm    [x]   Previous H&P reviewed, patient examined and there are NO CHANGES                 []   Previous H&P reviewed, patient examined and there are changes noted.   [name], MD 9/13/20214:41 PM

## 2020-06-17 NOTE — Anesthesia Preprocedure Evaluation (Signed)
Anesthesia Evaluation  Patient identified by MRN, date of birth, ID band Patient awake    Reviewed: Allergy & Precautions, NPO status , Patient's Chart, lab work & pertinent test results  History of Anesthesia Complications Negative for: history of anesthetic complications  Airway Mallampati: II  TM Distance: >3 FB Neck ROM: Full    Dental no notable dental hx.    Pulmonary neg pulmonary ROS, neg sleep apnea, neg COPD,    breath sounds clear to auscultation- rhonchi (-) wheezing      Cardiovascular hypertension, + CAD  (-) Cardiac Stents and (-) CABG + dysrhythmias + pacemaker  Rhythm:Regular Rate:Normal - Systolic murmurs and - Diastolic murmurs    Neuro/Psych neg Seizures PSYCHIATRIC DISORDERS Anxiety Depression negative neurological ROS     GI/Hepatic negative GI ROS, Neg liver ROS,   Endo/Other  negative endocrine ROSneg diabetes  Renal/GU Renal disease: hx of nephrolithiasis.     Musculoskeletal  (+) Arthritis ,   Abdominal (+) - obese,   Peds  Hematology  (+) anemia ,   Anesthesia Other Findings Past Medical History: No date: Anxiety Non-obstructive: CAD (coronary artery disease)     Comment:  a. 2004 Cath: nonobs dzs. No date: DDD (degenerative disc disease)     Comment:  a. with chronic right sided back pain - improves after               seeing chiropractor. No date: Depression     Comment:  a. h/o ECT No date: Diverticulitis No date: Glaucoma No date: HTN (hypertension) No date: Hyperlipidemia, mixed 2008: Melanoma of skin ([place])     Comment:  resected from Left arm  No date: Memory problem     Comment:  "states memory issues" No date: Nephrolithiasis No date: Pancreatic cyst     Comment:  a. [id number] Endoscopic U/S: nl UGI tract, 1-6mm pancreatic               cysts, no masses/nodules. No date: Premature ventricular contraction     Comment:  a. managed with propafenone. No date: Presence of  permanent cardiac pacemaker No date: Symptomatic bradycardia     Comment:  a. s/p MDT PPM in 08/2005;  b. 02/2010 Gen change->MDT               Adapta DC PPM, ser # [id number].   Reproductive/Obstetrics                             Anesthesia Physical Anesthesia Plan  ASA: III  Anesthesia Plan: General   Post-op Pain Management:    Induction: Intravenous  PONV Risk Score and Plan: 2 and Ondansetron  Airway Management Planned: Mask  Additional Equipment:   Intra-op Plan:   Post-operative Plan:   Informed Consent: I have reviewed the patients History and Physical, chart, labs and discussed the procedure including the risks, benefits and alternatives for the proposed anesthesia with the patient or authorized representative who has indicated his/her understanding and acceptance.     Dental advisory given  Plan Discussed with: CRNA and Anesthesiologist  Anesthesia Plan Comments:         Anesthesia Quick Evaluation

## 2020-06-17 NOTE — H&P (Signed)
Barbara Marks is an 86 y.o. female.   Chief Complaint: return of severe depression HPI: longstading severe depression  Past Medical History:  Diagnosis Date  . Anxiety   . CAD (coronary artery disease) Non-obstructive   a. 2004 Cath: nonobs dzs.  . DDD (degenerative disc disease)    a. with chronic right sided back pain - improves after seeing chiropractor.  . Depression    a. h/o ECT  . Diverticulitis   . Glaucoma   . HTN (hypertension)   . Hyperlipidemia, mixed   . Melanoma of skin ([place]) 2008   resected from Left arm   . Memory problem    "states memory issues"  . Nephrolithiasis   . Pancreatic cyst    a. [id number] Endoscopic U/S: nl UGI tract, 1-39mm pancreatic cysts, no masses/nodules.  . Premature ventricular contraction    a. managed with propafenone.  . Presence of permanent cardiac pacemaker   . Symptomatic bradycardia    a. s/p MDT PPM in 08/2005;  b. 02/2010 Gen change->MDT Adapta DC PPM, ser # [id number].    Past Surgical History:  Procedure Laterality Date  . APPENDECTOMY  1952  . CATARACT EXTRACTION, BILATERAL    . CHOLECYSTECTOMY  1983  . EUS N/A 06/22/2013   Procedure: UPPER ENDOSCOPIC ULTRASOUND (EUS) LINEAR;  Surgeon: Milus Banister, MD;  Location: [facility];  Service: Endoscopy;  Laterality: N/A;  . EYE SURGERY Bilateral    Cataract Extraction with IOL  . PACEMAKER INSERTION Left    [employer]  . TUMOR EXCISION     And nemamgeomas    Family History  Problem Relation Age of Onset  . Other Mother        died @ 54 - old age.  . Stroke Father        cva after cea in his 26's, died @ 26.  . Kidney cancer Brother    Social History:  reports that she has never smoked. She has never used smokeless tobacco. She reports that she does not drink alcohol and does not use drugs.  Allergies:  Allergies  Allergen Reactions  . Codeine     unknown  . Simvastatin Other (See Comments)    "leg cramps"    (Not in a hospital admission)   No results found  for this or any previous visit (from the past 48 hour(s)). No results found.  Review of Systems  Constitutional: Positive for fatigue and unexpected weight change.  HENT: Negative.   Eyes: Negative.   Respiratory: Negative.   Cardiovascular: Negative.   Gastrointestinal: Negative.   Musculoskeletal: Negative.   Skin: Negative.   Neurological: Negative.   Psychiatric/Behavioral: Positive for dysphoric mood and sleep disturbance. The patient is nervous/anxious.     There were no vitals taken for this visit. Physical Exam Vitals and nursing note reviewed.  Constitutional:      Appearance: She is well-developed.  HENT:     Head: Normocephalic and atraumatic.  Eyes:     Conjunctiva/sclera: Conjunctivae normal.     Pupils: Pupils are equal, round, and reactive to light.  Cardiovascular:     Heart sounds: Normal heart sounds.  Pulmonary:     Effort: Pulmonary effort is normal.  Abdominal:     Palpations: Abdomen is soft.  Musculoskeletal:        General: Normal range of motion.     Cervical back: Normal range of motion.  Skin:    General: Skin is warm and dry.  Neurological:  General: No focal deficit present.     Mental Status: She is alert.  Psychiatric:        Attention and Perception: Attention normal.        Mood and Affect: Mood is depressed. Affect is blunt.        Speech: Speech is delayed.        Behavior: Behavior is slowed.        Thought Content: Thought content does not include homicidal or suicidal ideation.        Cognition and Memory: Cognition is impaired.        Judgment: Judgment normal.      Assessment/Plan  ect at least twice this week is advised  [name], MD 06/17/2020, 10:06 AM

## 2020-06-17 NOTE — Transfer of Care (Signed)
Immediate Anesthesia Transfer of Care Note  Patient: Barbara Marks  Procedure(s) Performed: ECT TX  Patient Location: PACU  Anesthesia Type:General  Level of Consciousness: drowsy and patient cooperative  Airway & Oxygen Therapy: Patient Spontanous Breathing  Post-op Assessment: Report given to RN and Post -op Vital signs reviewed and stable  Post vital signs: Reviewed and stable  Last Vitals:  Vitals Value Taken Time  BP 166/79 06/17/20 1229  Temp 36.5 C 06/17/20 1229  Pulse 78 06/17/20 1230  Resp 21 06/17/20 1230  SpO2 96 % 06/17/20 1230  Vitals shown include unvalidated device data.  Last Pain: There were no vitals filed for this visit.       Complications: No complications documented.

## 2020-06-20 ENCOUNTER — Other Ambulatory Visit: Payer: Self-pay | Admitting: Psychiatry

## 2020-06-21 ENCOUNTER — Other Ambulatory Visit: Payer: Self-pay

## 2020-06-21 ENCOUNTER — Encounter: Payer: Self-pay | Admitting: Anesthesiology

## 2020-06-21 ENCOUNTER — Ambulatory Visit: Payer: Self-pay | Admitting: Anesthesiology

## 2020-06-21 ENCOUNTER — Encounter (HOSPITAL_BASED_OUTPATIENT_CLINIC_OR_DEPARTMENT_OTHER)
Admission: RE | Admit: 2020-06-21 | Discharge: 2020-06-21 | Disposition: A | Discharge status: Home / Self Care | Payer: PPO | Source: Ambulatory Visit | Attending: Psychiatry | Admitting: Psychiatry

## 2020-06-21 DIAGNOSIS — F329 Major depressive disorder, single episode, unspecified: Secondary | ICD-10-CM | POA: Diagnosis not present

## 2020-06-21 DIAGNOSIS — E782 Mixed hyperlipidemia: Secondary | ICD-10-CM | POA: Diagnosis not present

## 2020-06-21 DIAGNOSIS — F332 Major depressive disorder, recurrent severe without psychotic features: Secondary | ICD-10-CM | POA: Diagnosis not present

## 2020-06-21 DIAGNOSIS — I10 Essential (primary) hypertension: Secondary | ICD-10-CM | POA: Diagnosis not present

## 2020-06-21 MED ORDER — SODIUM CHLORIDE 0.9 % IV SOLN
500.0000 mL | Freq: Once | INTRAVENOUS | Status: AC
  Administered 2020-06-21: 500 mL via INTRAVENOUS

## 2020-06-21 MED ORDER — KETAMINE HCL 10 MG/ML IJ SOLN
INTRAMUSCULAR | Status: DC | PRN
  Administered 2020-06-21: 50 mg via INTRAVENOUS

## 2020-06-21 MED ORDER — SUCCINYLCHOLINE CHLORIDE 20 MG/ML IJ SOLN
INTRAMUSCULAR | Status: DC | PRN
  Administered 2020-06-21: 90 mg via INTRAVENOUS

## 2020-06-21 MED ORDER — PROPOFOL 10 MG/ML IV BOLUS
INTRAVENOUS | Status: DC | PRN
  Administered 2020-06-21: 20 mg via INTRAVENOUS

## 2020-06-21 NOTE — H&P (Signed)
Barbara Marks is an 86 y.o. female.   Chief Complaint: continued depression a little better than Monday. Clams not to notice any general memory problems HPI: recurrent severe depression responsive to ect  Past Medical History:  Diagnosis Date  . Anxiety   . CAD (coronary artery disease) Non-obstructive   a. 2004 Cath: nonobs dzs.  . DDD (degenerative disc disease)    a. with chronic right sided back pain - improves after seeing chiropractor.  . Depression    a. h/o ECT  . Diverticulitis   . Glaucoma   . HTN (hypertension)   . Hyperlipidemia, mixed   . Melanoma of skin ([place]) 2008   resected from Left arm   . Memory problem    "states memory issues"  . Nephrolithiasis   . Pancreatic cyst    a. [id number] Endoscopic U/S: nl UGI tract, 1-48mm pancreatic cysts, no masses/nodules.  . Premature ventricular contraction    a. managed with propafenone.  . Presence of permanent cardiac pacemaker   . Symptomatic bradycardia    a. s/p MDT PPM in 08/2005;  b. 02/2010 Gen change->MDT Adapta DC PPM, ser # [id number] H.    Past Surgical History:  Procedure Laterality Date  . APPENDECTOMY  1952  . CATARACT EXTRACTION, BILATERAL    . CHOLECYSTECTOMY  1983  . EUS N/A 06/22/2013   Procedure: UPPER ENDOSCOPIC ULTRASOUND (EUS) LINEAR;  Surgeon: Milus Banister, MD;  Location: [facility];  Service: Endoscopy;  Laterality: N/A;  . EYE SURGERY Bilateral    Cataract Extraction with IOL  . PACEMAKER INSERTION Left    [employer]  . TUMOR EXCISION     And nemamgeomas    Family History  Problem Relation Age of Onset  . Other Mother        died @ 50 - old age.  . Stroke Father        cva after cea in his 32's, died @ 70.  . Kidney cancer Brother    Social History:  reports that she has never smoked. She has never used smokeless tobacco. She reports that she does not drink alcohol and does not use drugs.  Allergies:  Allergies  Allergen Reactions  . Codeine     unknown  . Simvastatin Other (See  Comments)    "leg cramps"    (Not in a hospital admission)   No results found for this or any previous visit (from the past 48 hour(s)). No results found.  Review of Systems  Constitutional: Negative.   HENT: Negative.   Eyes: Negative.   Respiratory: Negative.   Cardiovascular: Negative.   Gastrointestinal: Negative.   Musculoskeletal: Negative.   Skin: Negative.   Neurological: Negative.   Psychiatric/Behavioral: Positive for dysphoric mood and sleep disturbance. Negative for suicidal ideas.    Blood pressure (!) 160/62, pulse (!) 43, temperature 98.3 F (36.8 C), temperature source Oral, resp. rate 18, height 5\' 6"  (1.676 m), weight 67 kg, SpO2 98 %. Physical Exam Vitals and nursing note reviewed.  Constitutional:      Appearance: She is well-developed.  HENT:     Head: Normocephalic and atraumatic.  Eyes:     Conjunctiva/sclera: Conjunctivae normal.     Pupils: Pupils are equal, round, and reactive to light.  Cardiovascular:     Heart sounds: Normal heart sounds.  Pulmonary:     Effort: Pulmonary effort is normal.  Abdominal:     Palpations: Abdomen is soft.  Musculoskeletal:        General:  Normal range of motion.     Cervical back: Normal range of motion.  Skin:    General: Skin is warm and dry.  Neurological:     General: No focal deficit present.     Mental Status: She is alert.  Psychiatric:        Mood and Affect: Affect is blunt.      Assessment/Plan  ask her to also come Wednesday  [name], MD 06/21/2020, 9:52 AM

## 2020-06-21 NOTE — Transfer of Care (Signed)
Immediate Anesthesia Transfer of Care Note  Patient: Barbara Marks  Procedure(s) Performed: ECT TX  Patient Location: PACU  Anesthesia Type:General  Level of Consciousness: awake  Airway & Oxygen Therapy: Patient Spontanous Breathing and Patient connected to face mask oxygen  Post-op Assessment: Report given to RN and Post -op Vital signs reviewed and stable  Post vital signs: Reviewed  Last Vitals:  Vitals Value Taken Time  BP    Temp    Pulse    Resp    SpO2      Last Pain:  Vitals:   06/21/20 0917  TempSrc:   PainSc: 0-No pain         Complications: No complications documented.

## 2020-06-21 NOTE — Procedures (Signed)
ECT SERVICES Physician's Interval Evaluation & Treatment Note  Patient Identification: CARTER KAMAN MRN:  [id number] Date of Evaluation:  06/21/2020 TX #: 20  MADRS:   MMSE:   P.E. Findings:  Looks a little bit less disheveled  Psychiatric Interval Note:  Still depressed but not as anxious as last time  Subjective:  Patient is a 86 y.o. female seen for evaluation for Electroconvulsive Therapy. Slightly better  Treatment Summary:   []   Right Unilateral             [x]  Bilateral   % Energy : 1.0 ms 75%   Impedance: 2350 ohms  Seizure Energy Index: 3901 V squared  Postictal Suppression Index: 83%  Seizure Concordance Index: 92%  Medications  Pre Shock: Toradol 30 mg Brevital 80 mg succinylcholine 90 mg  Post Shock:    Seizure Duration: 29 seconds EMG 64 seconds EEG   Comments: I have prevailed upon her to please try and come back for another treatment sometime next week possibly Wednesday  Lungs:  [x]   Clear to auscultation               []  Other:   Heart:    [x]   Regular rhythm             []  irregular rhythm    [x]   Previous H&P reviewed, patient examined and there are NO CHANGES                 []   Previous H&P reviewed, patient examined and there are changes noted.   [name], MD 9/17/20216:32 PM

## 2020-06-21 NOTE — Anesthesia Preprocedure Evaluation (Signed)
Anesthesia Evaluation  Patient identified by MRN, date of birth, ID band Patient awake    Reviewed: Allergy & Precautions, NPO status , Patient's Chart, lab work & pertinent test results  History of Anesthesia Complications Negative for: history of anesthetic complications  Airway Mallampati: II  TM Distance: >3 FB Neck ROM: Full    Dental  (+) Poor Dentition, Chipped   Pulmonary neg pulmonary ROS, neg sleep apnea, neg COPD,    breath sounds clear to auscultation- rhonchi (-) wheezing      Cardiovascular hypertension, + CAD  (-) Cardiac Stents and (-) CABG + dysrhythmias + pacemaker  Rhythm:Regular Rate:Normal - Systolic murmurs and - Diastolic murmurs    Neuro/Psych neg Seizures PSYCHIATRIC DISORDERS Anxiety Depression negative neurological ROS     GI/Hepatic negative GI ROS, Neg liver ROS,   Endo/Other  negative endocrine ROSneg diabetes  Renal/GU Renal disease (hx of nephrolithiasis)     Musculoskeletal  (+) Arthritis ,   Abdominal (+) - obese,   Peds  Hematology  (+) Blood dyscrasia, anemia ,   Anesthesia Other Findings Past Medical History: No date: Anxiety Non-obstructive: CAD (coronary artery disease)     Comment:  a. 2004 Cath: nonobs dzs. No date: DDD (degenerative disc disease)     Comment:  a. with chronic right sided back pain - improves after               seeing chiropractor. No date: Depression     Comment:  a. h/o ECT No date: Diverticulitis No date: Glaucoma No date: HTN (hypertension) No date: Hyperlipidemia, mixed 2008: Melanoma of skin ([place])     Comment:  resected from Left arm  No date: Memory problem     Comment:  "states memory issues" No date: Nephrolithiasis No date: Pancreatic cyst     Comment:  a. [id number] Endoscopic U/S: nl UGI tract, 1-66mm pancreatic               cysts, no masses/nodules. No date: Premature ventricular contraction     Comment:  a. managed with  propafenone. No date: Presence of permanent cardiac pacemaker No date: Symptomatic bradycardia     Comment:  a. s/p MDT PPM in 08/2005;  b. 02/2010 Gen change->MDT               Adapta DC PPM, ser # [id number].   Reproductive/Obstetrics                             Anesthesia Physical  Anesthesia Plan  ASA: III  Anesthesia Plan: General   Post-op Pain Management:    Induction: Intravenous  PONV Risk Score and Plan: 2 and Ondansetron  Airway Management Planned: Mask  Additional Equipment:   Intra-op Plan:   Post-operative Plan:   Informed Consent: I have reviewed the patients History and Physical, chart, labs and discussed the procedure including the risks, benefits and alternatives for the proposed anesthesia with the patient or authorized representative who has indicated his/her understanding and acceptance.     Dental advisory given  Plan Discussed with: CRNA and Anesthesiologist  Anesthesia Plan Comments: (Patient consented for risks of anesthesia including but not limited to:  - adverse reactions to medications - risk of intubation if required - damage to eyes, teeth, lips or other oral mucosa - nerve damage due to positioning  - sore throat or hoarseness - Damage to heart, brain, nerves, lungs, other parts of body or loss of  life  Patient voiced understanding.)        Anesthesia Quick Evaluation

## 2020-06-24 NOTE — Anesthesia Postprocedure Evaluation (Signed)
Anesthesia Post Note  Patient: Barbara Marks  Procedure(s) Performed: ECT TX  Patient location during evaluation: PACU Anesthesia Type: General Level of consciousness: awake and alert Pain management: pain level controlled Vital Signs Assessment: post-procedure vital signs reviewed and stable Respiratory status: spontaneous breathing, nonlabored ventilation and respiratory function stable Cardiovascular status: blood pressure returned to baseline and stable Postop Assessment: no apparent nausea or vomiting Anesthetic complications: no   No complications documented.   Last Vitals:  Vitals:   06/21/20 1228 06/21/20 1245  BP: (!) 160/83 (!) 158/85  Pulse: 87 86  Resp: 20 (!) 21  Temp: 36.8 C 36.8 C  SpO2: 100%     Last Pain:  Vitals:   06/21/20 1245  TempSrc: Oral  PainSc: 0-No pain                 Brett Canales Mayce Noyes

## 2020-06-25 ENCOUNTER — Other Ambulatory Visit: Payer: Self-pay | Admitting: Psychiatry

## 2020-06-26 ENCOUNTER — Encounter: Payer: Self-pay | Admitting: Certified Registered Nurse Anesthetist

## 2020-06-26 ENCOUNTER — Inpatient Hospital Stay: Admission: RE | Admit: 2020-06-26 | Payer: PPO | Source: Ambulatory Visit

## 2020-07-24 ENCOUNTER — Encounter: Payer: Self-pay | Admitting: Family Medicine

## 2020-07-24 ENCOUNTER — Ambulatory Visit (INDEPENDENT_AMBULATORY_CARE_PROVIDER_SITE_OTHER): Payer: PPO | Admitting: Family Medicine

## 2020-07-24 ENCOUNTER — Other Ambulatory Visit: Payer: Self-pay

## 2020-07-24 VITALS — BP 130/80 | HR 56 | Temp 97.8°F | Ht 66.0 in | Wt 159.8 lb

## 2020-07-24 DIAGNOSIS — R109 Unspecified abdominal pain: Secondary | ICD-10-CM

## 2020-07-24 DIAGNOSIS — M549 Dorsalgia, unspecified: Secondary | ICD-10-CM

## 2020-07-24 LAB — POC URINALSYSI DIPSTICK (AUTOMATED)
Bilirubin, UA: NEGATIVE
Blood, UA: NEGATIVE
Glucose, UA: NEGATIVE
Nitrite, UA: NEGATIVE
Protein, UA: POSITIVE — AB
Spec Grav, UA: 1.02 (ref 1.010–1.025)
Urobilinogen, UA: 0.2 E.U./dL
pH, UA: 6.5 (ref 5.0–8.0)

## 2020-07-24 NOTE — Progress Notes (Signed)
Cecilee Rosner T. Jazarah Capili, MD, [place]  [facility] and [place] at [facility] [place], [address]  Phone: [phone]  FAX: [phone]  SHADAY RAYBORN - 86 y.o. female  MRN [id number]  Date of Birth: [birth year]  Date: 07/24/2020  PCP: Lesleigh Noe, MD  Referral: Lesleigh Noe, MD  Chief Complaint  Patient presents with  . Hip Pain    right, history of kidney stones    This visit occurred during the SARS-CoV-2 public health emergency.  Safety protocols were in place, including screening questions prior to the visit, additional usage of staff PPE, and extensive cleaning of exam room while observing appropriate contact time as indicated for disinfecting solutions.   Subjective:   Barbara Marks is a 86 y.o. very pleasant female patient with Body mass index is 25.78 kg/m. who presents with the following:  Decreased urine, no pain with urination.  She has a history of some kidney stones, and she worries that she could have a kidney stone right now.  She does have some decreased urination and pain in her lower back on the right side.  She has not had any microscopic blood in her urine.  Her pain is not severe.  Her pain is predominantly in the right lower back as well as to a lesser extent in the flank.  She has not had any acute injury that she can recall.  She does have a history of some lumbosacral degenerative disc disease in the past, this is predominantly been right-sided.  Pain in the back.  MSK back pain Check urine culture  Review of Systems is noted in the HPI, as appropriate  Objective:   BP 130/80   Pulse (!) 56   Temp 97.8 F (36.6 C) (Skin)   Ht 5\' 6"  (1.676 m)   Wt 159 lb 12 oz (72.5 kg)   SpO2 96%   BMI 25.78 kg/m   GEN: No acute distress; alert,appropriate. PULM: Breathing comfortably in no respiratory distress PSYCH: Normally interactive.  ABD: S, NT, ND, + BS, No rebound,  No HSM   She does have tenderness at the SI joints.  She also has some tenderness in the posterior pelvis on the right.  Minimal tenderness from L1-L4, but L5-S1 is tender, right greater than left.  She is neurovascularly intact throughout her lower extremities with strength 5/5 and fully intact sensation.  Ribs are nontender.  Rotational movements at the hip are nontender and they do not provoke anterior groin pain.  Laboratory and Imaging Data:  Results for orders placed or performed in visit on 07/24/20  POCT Urinalysis Dipstick (Automated)  Result Value Ref Range   Color, UA yellow    Clarity, UA cloudy    Glucose, UA Negative Negative   Bilirubin, UA negative    Ketones, UA 1+    Spec Grav, UA 1.020 1.010 - 1.025   Blood, UA negative    pH, UA 6.5 5.0 - 8.0   Protein, UA Positive (A) Negative   Urobilinogen, UA 0.2 0.2 or 1.0 E.U./dL   Nitrite, UA negative    Leukocytes, UA Small (1+) (A) Negative     Assessment and Plan:     ICD-10-CM   1. Acute back pain, unspecified back location, unspecified back pain laterality  M54.9   2. Flank pain  R10.9 POCT Urinalysis Dipstick (Automated)    Urine Culture   On exam, this is consistent with  some mild musculoskeletal back pain and SI joint dysfunction.  Recommended some basic heat, normal activity, minimal range of motion as she is able.  I think that this will very likely just go away on its own.  No blood.  Very unlikely nephrolithiasis.  UTI could be possible.  She does have some leukocytes, sent for culture.  No orders of the defined types were placed in this encounter.  There are no discontinued medications. Orders Placed This Encounter  Procedures  . Urine Culture  . POCT Urinalysis Dipstick (Automated)    Follow-up: No follow-ups on file.  Signed,  Maud Deed. Aarianna Hoadley, MD   Outpatient Encounter Medications as of 07/24/2020  Medication Sig  . amLODipine (NORVASC) 5 MG tablet TAKE 1 TABLET BY MOUTH IN THE  MORNING  . buPROPion (WELLBUTRIN XL) 300 MG 24 hr tablet TAKE 1 TABLET BY MOUTH EVERY OTHER DAY  . clobetasol cream (TEMOVATE) 7.41 % Apply 1 application topically 2 (two) times daily.  . Coenzyme Q10 (COQ10) 150 MG CAPS Take 1 capsule by mouth daily.    . Cyanocobalamin (VITAMIN B 12 PO) Take 1 tablet by mouth daily.   [name] ketoconazole (NIZORAL) 2 % shampoo   . mirtazapine (REMERON) 15 MG tablet Take 1 tablet (15 mg total) by mouth at bedtime.  . Multiple Vitamin (MULTIVITAMIN) tablet Take 1 tablet by mouth daily.    . Multiple Vitamins-Minerals (PRESERVISION AREDS) TABS Take 1 tablet by mouth daily.   [name] OVER THE COUNTER MEDICATION Nocturest Liquid 1/4 tsp at bedtime  . potassium chloride (KLOR-CON) 10 MEQ tablet Take 1 tablet by mouth once daily  . Red Yeast Rice 600 MG CAPS Take 600 mg by mouth daily.  . predniSONE (DELTASONE) 5 MG tablet Take 1 tablet as prescribed (Patient not taking: Reported on 07/24/2020)   No facility-administered encounter medications on file as of 07/24/2020.

## 2020-07-25 LAB — URINE CULTURE
MICRO NUMBER:: 11096164
Result:: NO GROWTH
SPECIMEN QUALITY:: ADEQUATE

## 2020-07-26 ENCOUNTER — Telehealth: Payer: Self-pay | Admitting: *Deleted

## 2020-07-26 NOTE — Telephone Encounter (Signed)
Pt called triage because she got a notification from mychart that urine cx is back. Urine cx is negative but pt said she is still having the same pain and issues she told Dr. Lorelei Pont about at appt on 07/24/20 and doesn't know what is the next step. Pt request call back  Will route to PCP and Dr. Lorelei Pont since they both are out of the office today

## 2020-07-26 NOTE — Telephone Encounter (Signed)
Appreciate Dr. Lillie Fragmin response.   If time, please call patient if she doesn't view the MyChart message attached to the results

## 2020-07-26 NOTE — Telephone Encounter (Signed)
I am sending her a mychart message now.  She was having back pain at her office visit. Thanks!

## 2020-07-29 NOTE — Telephone Encounter (Signed)
Pt saw mychart message on 07/29/20.

## 2020-08-12 ENCOUNTER — Other Ambulatory Visit: Payer: Self-pay

## 2020-08-12 ENCOUNTER — Encounter: Payer: Self-pay | Admitting: Family Medicine

## 2020-08-12 ENCOUNTER — Ambulatory Visit
Admission: RE | Admit: 2020-08-12 | Discharge: 2020-08-12 | Disposition: A | Discharge status: Home / Self Care | Payer: PPO | Source: Ambulatory Visit | Attending: Family Medicine | Admitting: Family Medicine

## 2020-08-12 ENCOUNTER — Ambulatory Visit (INDEPENDENT_AMBULATORY_CARE_PROVIDER_SITE_OTHER): Payer: PPO | Admitting: Family Medicine

## 2020-08-12 VITALS — BP 120/60 | HR 44 | Temp 98.1°F | Ht 66.0 in | Wt 161.0 lb

## 2020-08-12 DIAGNOSIS — I493 Ventricular premature depolarization: Secondary | ICD-10-CM

## 2020-08-12 DIAGNOSIS — Z79899 Other long term (current) drug therapy: Secondary | ICD-10-CM

## 2020-08-12 DIAGNOSIS — R7989 Other specified abnormal findings of blood chemistry: Secondary | ICD-10-CM

## 2020-08-12 DIAGNOSIS — I447 Left bundle-branch block, unspecified: Secondary | ICD-10-CM | POA: Diagnosis not present

## 2020-08-12 DIAGNOSIS — I2510 Atherosclerotic heart disease of native coronary artery without angina pectoris: Secondary | ICD-10-CM | POA: Diagnosis not present

## 2020-08-12 DIAGNOSIS — M7989 Other specified soft tissue disorders: Secondary | ICD-10-CM

## 2020-08-12 DIAGNOSIS — N182 Chronic kidney disease, stage 2 (mild): Secondary | ICD-10-CM | POA: Diagnosis not present

## 2020-08-12 NOTE — Progress Notes (Addendum)
Barbara Marks T. Barbara Olds, MD, [place]  Primary Care and [place] at [facility] [place], [address]  Phone: [phone]  FAX: [phone]  Barbara Marks - 86 y.o. female  MRN [id number]  Date of Birth: 11/25/1933  Date: 08/12/2020  PCP: Barbara Noe, MD  Referral: Barbara Noe, MD  Chief Complaint  Patient presents with  . Foot Swelling  . Hypertension    This visit occurred during the SARS-CoV-2 public health emergency.  Safety protocols were in place, including screening questions prior to the visit, additional usage of staff PPE, and extensive cleaning of exam room while observing appropriate contact time as indicated for disinfecting solutions.   Subjective:   Barbara Marks is a 86 y.o. very pleasant female patient with Body mass index is 25.99 kg/m. who presents with the following:  She presents with some left-sided ankle and lower extremity swelling that occurred over the weekend.  She has no inciting event and no trauma that she can recall.  There is swelling, and this was quite a bit worse compared to today over the weekend, and she has had her leg propped up all morning prior to coming in the office.  Prior to this, she reports that she did not really have any significant swelling of the lower extremities.  She does have a history of coronary disease.  She also has had some elevated blood pressure readings at 158/86 at home.  While her blood pressure was normally check in, I did recheck this myself and it was 144/86   Review of Systems is noted in the HPI, as appropriate  Objective:   BP 120/60   Pulse (!) 44   Temp 98.1 F (36.7 C) (Temporal)   Ht 5\' 6"  (1.676 m)   Wt 161 lb (73 kg)   SpO2 98%   BMI 25.99 kg/m   GEN: No acute distress; alert,appropriate. PULM: Breathing comfortably in no respiratory distress PSYCH: Normally interactive.  CV: RRR, no m/g/r  PULM: Normal  respiratory rate, no accessory muscle use. No wheezes, crackles or rhonchi  LE: No clubbing or cyanosis, but the patient does have 1-2+ lower extremity edema on the left lower extremity, and trace to 1 on the right.  Laboratory and Imaging Data: US Venous Img Lower Unilateral Left (DVT)  Result Date: 08/12/2020 CLINICAL DATA:  Acute left lower extremity swelling. EXAM: Left LOWER EXTREMITY VENOUS DOPPLER ULTRASOUND TECHNIQUE: Gray-scale sonography with compression, as well as color and duplex ultrasound, were performed to evaluate the deep venous system(s) from the level of the common femoral vein through the popliteal and proximal calf veins. COMPARISON:  None. FINDINGS: VENOUS Normal compressibility of the common femoral, superficial femoral, and popliteal veins, as well as the visualized calf veins. Visualized portions of profunda femoral vein and great saphenous vein unremarkable. No filling defects to suggest DVT on grayscale or color Doppler imaging. Doppler waveforms show normal direction of venous flow, normal respiratory plasticity and response to augmentation. Limited views of the contralateral common femoral vein are unremarkable. OTHER None. Limitations: none IMPRESSION: Negative. Electronically Signed   By: Barbara Marks M.D.   On: 08/12/2020 16:24    Results for orders placed or performed in visit on 08/12/20  Brain natriuretic peptide  Result Value Ref Range   Pro B Natriuretic peptide (BNP) 129.0 (H) 0.0 - 100.0 pg/mL  Basic metabolic panel  Result Value Ref Range   Sodium 139 135 -  145 mEq/L   Potassium 3.6 3.5 - 5.1 mEq/L   Chloride 102 96 - 112 mEq/L   CO2 31 19 - 32 mEq/L   Glucose, Bld 87 70 - 99 mg/dL   BUN 13 6 - 23 mg/dL   Creatinine, Ser 0.79 0.40 - 1.20 mg/dL   GFR 68.91 >60.00 mL/min   Calcium 9.5 8.4 - 10.5 mg/dL  Hepatic function panel  Result Value Ref Range   Total Bilirubin 0.5 0.2 - 1.2 mg/dL   Bilirubin, Direct 0.1 0.0 - 0.3 mg/dL   Alkaline Phosphatase  66 39 - 117 U/L   AST 20 0 - 37 U/L   ALT 11 0 - 35 U/L   Total Protein 6.5 6.0 - 8.3 g/dL   Albumin 3.9 3.5 - 5.2 g/dL  CBC with Differential/Platelet  Result Value Ref Range   WBC 6.5 4.0 - 10.5 K/uL   RBC 4.08 3.87 - 5.11 Mil/uL   Hemoglobin 12.4 12.0 - 15.0 g/dL   HCT 37.8 36 - 46 %   MCV 92.6 78.0 - 100.0 fl   MCHC 32.9 30.0 - 36.0 g/dL   RDW 13.9 11.5 - 15.5 %   Platelets 198.0 150 - 400 K/uL   Neutrophils Relative % 61.9 43 - 77 %   Lymphocytes Relative 25.4 12 - 46 %   Monocytes Relative 9.8 3 - 12 %   Eosinophils Relative 1.2 0 - 5 %   Basophils Relative 1.7 0 - 3 %   Neutro Abs 4.0 1.4 - 7.7 K/uL   Lymphs Abs 1.6 0.7 - 4.0 K/uL   Monocytes Absolute 0.6 0.1 - 1.0 K/uL   Eosinophils Absolute 0.1 0.0 - 0.7 K/uL   Basophils Absolute 0.1 0.0 - 0.1 K/uL     Assessment and Plan:     ICD-10-CM   1. Swelling of left lower extremity  M79.89 Brain natriuretic peptide    US Venous Img Lower Unilateral Left (DVT)    Ambulatory referral to Cardiology  2. Atherosclerosis of native coronary artery of native heart without angina pectoris  I25.10 Brain natriuretic peptide    Ambulatory referral to Cardiology  3. CKD (chronic kidney disease) stage 2, GFR 60-89 ml/min  P29.5 Basic metabolic panel  4. Encounter for long-term (current) use of medications  Z79.899 Hepatic function panel    CBC with Differential/Platelet  5. Frequent PVCs  I49.3 Ambulatory referral to Cardiology  6. Left bundle branch block  I44.7 Ambulatory referral to Cardiology  7. Elevated brain natriuretic peptide (BNP) level  R79.89 Ambulatory referral to Cardiology   Total encounter time: 30 minutes. This includes total time spent on the day of encounter.  This includes record review, coordination of care, management before and after office visit.  In the setting of new onset lower extremity edema without inciting event, check a ultrasound for DVT, and this was negative.  Multiple etiologies are possible.   Check renal and liver function.  In a setting of coronary disease, check a cardiac BNP for concern of onset of congestive heart failure.  Addendum, 08/13/20 11:33 AM  Her cardiac BNP is elevated at 129, she has CAD, and she was lost to follow-up with Cardiology.  I do not think this is urgent, but I will reconsult Dr. Rockey Situ who saw her in 2018.  No orders of the defined types were placed in this encounter.  There are no discontinued medications. Orders Placed This Encounter  Procedures  . US Venous Img Lower Unilateral Left (DVT)  .  Brain natriuretic peptide  . Basic metabolic panel  . Hepatic function panel  . CBC with Differential/Platelet  . Ambulatory referral to Cardiology    Follow-up: No follow-ups on file.  Signed,  Maud Deed. Katharina Jehle, MD   Outpatient Encounter Medications as of 08/12/2020  Medication Sig  . amLODipine (NORVASC) 5 MG tablet TAKE 1 TABLET BY MOUTH IN THE MORNING  . buPROPion (WELLBUTRIN XL) 300 MG 24 hr tablet TAKE 1 TABLET BY MOUTH EVERY OTHER DAY  . clobetasol cream (TEMOVATE) 0.21 % Apply 1 application topically 2 (two) times daily.  . Coenzyme Q10 (COQ10) 150 MG CAPS Take 1 capsule by mouth daily.    . Cyanocobalamin (VITAMIN B 12 PO) Take 1 tablet by mouth daily.   [name] ketoconazole (NIZORAL) 2 % shampoo   . mirtazapine (REMERON) 15 MG tablet Take 1 tablet (15 mg total) by mouth at bedtime.  . Multiple Vitamin (MULTIVITAMIN) tablet Take 1 tablet by mouth daily.    . Multiple Vitamins-Minerals (PRESERVISION AREDS) TABS Take 1 tablet by mouth in the morning and at bedtime.   [name] OVER THE COUNTER MEDICATION Nocturest Liquid 1/4 tsp at bedtime  . potassium chloride (KLOR-CON) 10 MEQ tablet Take 1 tablet by mouth once daily  . predniSONE (DELTASONE) 5 MG tablet Take 1 tablet as prescribed  . Red Yeast Rice 600 MG CAPS Take 600 mg by mouth daily.   No facility-administered encounter medications on file as of 08/12/2020.

## 2020-08-13 ENCOUNTER — Encounter: Payer: Self-pay | Admitting: Family Medicine

## 2020-08-13 LAB — HEPATIC FUNCTION PANEL
ALT: 11 U/L (ref 0–35)
AST: 20 U/L (ref 0–37)
Albumin: 3.9 g/dL (ref 3.5–5.2)
Alkaline Phosphatase: 66 U/L (ref 39–117)
Bilirubin, Direct: 0.1 mg/dL (ref 0.0–0.3)
Total Bilirubin: 0.5 mg/dL (ref 0.2–1.2)
Total Protein: 6.5 g/dL (ref 6.0–8.3)

## 2020-08-13 LAB — BASIC METABOLIC PANEL
BUN: 13 mg/dL (ref 6–23)
CO2: 31 mEq/L (ref 19–32)
Calcium: 9.5 mg/dL (ref 8.4–10.5)
Chloride: 102 mEq/L (ref 96–112)
Creatinine, Ser: 0.79 mg/dL (ref 0.40–1.20)
GFR: 68.91 mL/min (ref >60.00)
Glucose, Bld: 87 mg/dL (ref 70–99)
Potassium: 3.6 mEq/L (ref 3.5–5.1)
Sodium: 139 mEq/L (ref 135–145)

## 2020-08-13 LAB — CBC WITH DIFFERENTIAL/PLATELET
Basophils Absolute: 0.1 10*3/uL (ref 0.0–0.1)
Basophils Relative: 1.7 % (ref 0.0–3.0)
Eosinophils Absolute: 0.1 10*3/uL (ref 0.0–0.7)
Eosinophils Relative: 1.2 % (ref 0.0–5.0)
HCT: 37.8 % (ref 36.0–46.0)
Hemoglobin: 12.4 g/dL (ref 12.0–15.0)
Lymphocytes Relative: 25.4 % (ref 12.0–46.0)
Lymphs Abs: 1.6 10*3/uL (ref 0.7–4.0)
MCHC: 32.9 g/dL (ref 30.0–36.0)
MCV: 92.6 fl (ref 78.0–100.0)
Monocytes Absolute: 0.6 10*3/uL (ref 0.1–1.0)
Monocytes Relative: 9.8 % (ref 3.0–12.0)
Neutro Abs: 4 10*3/uL (ref 1.4–7.7)
Neutrophils Relative %: 61.9 % (ref 43.0–77.0)
Platelets: 198 10*3/uL (ref 150.0–400.0)
RBC: 4.08 Mil/uL (ref 3.87–5.11)
RDW: 13.9 % (ref 11.5–15.5)
WBC: 6.5 10*3/uL (ref 4.0–10.5)

## 2020-08-13 LAB — BRAIN NATRIURETIC PEPTIDE: Pro B Natriuretic peptide (BNP): 129 pg/mL — ABNORMAL HIGH (ref 0.0–100.0)

## 2020-08-13 NOTE — Telephone Encounter (Signed)
Lab results were discussed with Barbara Marks via telephone.  Dr. Lorelei Pont will be setting her up an appointment to follow up with her cardiologist.  See Result Note.

## 2020-08-13 NOTE — Addendum Note (Signed)
Addended by: Owens Loffler on: 08/13/2020 11:33 AM   Modules accepted: Orders

## 2020-08-15 ENCOUNTER — Other Ambulatory Visit: Payer: Self-pay

## 2020-08-15 ENCOUNTER — Ambulatory Visit: Payer: PPO | Admitting: Cardiology

## 2020-08-15 ENCOUNTER — Encounter: Payer: Self-pay | Admitting: Cardiology

## 2020-08-15 VITALS — BP 130/60 | HR 81 | Ht 66.0 in | Wt 159.0 lb

## 2020-08-15 DIAGNOSIS — I10 Essential (primary) hypertension: Secondary | ICD-10-CM

## 2020-08-15 DIAGNOSIS — R600 Localized edema: Secondary | ICD-10-CM

## 2020-08-15 DIAGNOSIS — R011 Cardiac murmur, unspecified: Secondary | ICD-10-CM

## 2020-08-15 DIAGNOSIS — I493 Ventricular premature depolarization: Secondary | ICD-10-CM | POA: Diagnosis not present

## 2020-08-15 NOTE — Progress Notes (Signed)
Cardiology Office Note:    Date:  08/15/2020   ID:  Barbara Marks, DOB Jun 24, 1934, MRN [id number]  PCP:  Lesleigh Noe, MD  [facility] Cardiologist:  Kate Sable, MD  [place] Electrophysiologist:  None   Referring MD: Owens Loffler, MD   Chief Complaint  Patient presents with  . New Patient (Initial Visit)    Referred by Dr. Lorelei Pont for LLE swelling, Atherosclerosis, PVCs, LBBB, and elevated BNP  Pt states both legs are swollen--started last week. Pt states she also had two episodes last week of feeling dizzy while standing up. Today when she laid down for EKG, stated she is seeing spots "kind of like spider webs."   Barbara Marks is a 86 y.o. female who is being seen today for the evaluation of edema at the request of Copland, Spencer, MD.   History of Present Illness:    Barbara Marks is a 86 y.o. female with a hx of hypertension, hyperlipidemia, symptomatic bradycardia status post permanent pacemaker 2006, GEN change 2011 depression, anxiety who presents due to lower extremity swelling.  Patient saw sports medicine physician on 08/12/2020 due to worsening left ankle swelling which is new.  Swelling ongoing for the past several weeks.  Swelling has gotten better over the past 3 days.  Swelling stays about the same throughout the day.  BNP was obtained and elevated at 129.  Lower extremity ultrasound did not show any DVT.  Patient previously seen in the office 3 years ago for fatigue.  Previous EKGs showed bigeminy and PVCs, left bundle branch block.  Echocardiogram 2014 with normal EF, 50-55, impaired relaxation.[name]  History of myalgias with statins she was in the [place] previously for about 20 years.  She states her pacemaker was placed due to recurrent PVCs.  [place] the [place], it was discovered she never needed a pacemaker due to PVCs.  Her pacemaker was "turned off".  Has not followed up in the clinic since 2018.  Past Medical  History:  Diagnosis Date  . Anxiety   . CAD (coronary artery disease) Non-obstructive   a. 2004 Cath: nonobs dzs.  . DDD (degenerative disc disease)    a. with chronic right sided back pain - improves after seeing chiropractor.  . Depression    a. h/o ECT  . Diverticulitis   . Glaucoma   . HTN (hypertension)   . Hyperlipidemia, mixed   . Melanoma of skin ([place]) 2008   resected from Left arm   . Memory problem    "states memory issues"  . Nephrolithiasis   . Pancreatic cyst    a. [id number] Endoscopic U/S: nl UGI tract, 1-30mm pancreatic cysts, no masses/nodules.  . Premature ventricular contraction    a. managed with propafenone.  . Presence of permanent cardiac pacemaker   . Symptomatic bradycardia    a. s/p MDT PPM in 08/2005;  b. 02/2010 Gen change->MDT Adapta DC PPM, ser # [id number].    Past Surgical History:  Procedure Laterality Date  . APPENDECTOMY  1952  . CATARACT EXTRACTION, BILATERAL    . CHOLECYSTECTOMY  1983  . EUS N/A 06/22/2013   Procedure: UPPER ENDOSCOPIC ULTRASOUND (EUS) LINEAR;  Surgeon: Milus Banister, MD;  Location: [facility];  Service: Endoscopy;  Laterality: N/A;  . EYE SURGERY Bilateral    Cataract Extraction with IOL  . PACEMAKER INSERTION Left    [employer]  . TUMOR EXCISION     And nemamgeomas    Current  Medications: Current Meds  Medication Sig  . amLODipine (NORVASC) 5 MG tablet TAKE 1 TABLET BY MOUTH IN THE MORNING  . Ascorbic Acid (VITAMIN C PO) Take by mouth daily.  . B Complex Vitamins (B COMPLEX PO) Take by mouth daily.  [name] MAGNESIUM PO Take by mouth daily.  . Multiple Vitamin (MULTIVITAMIN) tablet Take 1 tablet by mouth daily.    . Multiple Vitamins-Minerals (PRESERVISION AREDS) TABS Take 1 tablet by mouth in the morning and at bedtime.   . Naproxen Sodium (ALEVE PO) Take by mouth as needed (pain).  . potassium chloride (KLOR-CON) 10 MEQ tablet Take 1 tablet by mouth once daily  . Red Yeast Rice 600 MG CAPS Take 600 mg by mouth daily.   . TURMERIC PO Take by mouth daily.  [name] Ubiquinol (QUNOL COQ10/UBIQUINOL/MEGA) 100 MG CAPS Take by mouth daily.     Allergies:   Codeine and Simvastatin   Social History   Socioeconomic History  . Marital status: Widowed    Spouse name: Not on file  . Number of children: 3  . Years of education: some college  . Highest education level: Not on file  Occupational History  . Occupation: Retired    Fish farm manager: RETIRED  Tobacco Use  . Smoking status: Never Smoker  . Smokeless tobacco: Never Used  Vaping Use  . Vaping Use: Never used  Substance and Sexual Activity  . Alcohol use: No  . Drug use: No  . Sexual activity: Not Currently  Other Topics Concern  . Not on file  Social History Narrative   Lives in [place]. Lives with a friend (also 8 year - Paul).    3 Kids, 7 grandchildren, 1 great-grandchild   Enjoys: movies on Friday with friends, walks   Exercise: walking around the house for exercise   Diet: likes everything   Social Determinants of Health   Financial Resource Strain:   . Difficulty of Paying Living Expenses: Not on file  Food Insecurity:   . Worried About Charity fundraiser in the Last Year: Not on file  . Ran Out of Food in the Last Year: Not on file  Transportation Needs:   . Lack of Transportation (Medical): Not on file  . Lack of Transportation (Non-Medical): Not on file  Physical Activity:   . Days of Exercise per Week: Not on file  . Minutes of Exercise per Session: Not on file  Stress:   . Feeling of Stress : Not on file  Social Connections:   . Frequency of Communication with Friends and Family: Not on file  . Frequency of Social Gatherings with Friends and Family: Not on file  . Attends Religious Services: Not on file  . Active Member of Clubs or Organizations: Not on file  . Attends Archivist Meetings: Not on file  . Marital Status: Not on file     Family History: The patient's family history includes Kidney cancer in her  brother; Other in her mother; Stroke in her father.  ROS:   Please see the history of present illness.     All other systems reviewed and are negative.  EKGs/Labs/Other Studies Reviewed:    The following studies were reviewed today:   EKG:  EKG is  ordered today.  The ekg ordered today demonstrates sinus rhythm, frequent PVCs, bigeminy, left bundle branch block.  Recent Labs: 08/12/2020: ALT 11; BUN 13; Creatinine, Ser 0.79; Hemoglobin 12.4; Platelets 198.0; Potassium 3.6; Pro B Natriuretic peptide (BNP) 129.0; Sodium  139  Recent Lipid Panel    Component Value Date/Time   CHOL 235 (H) 07/27/2019 0935   TRIG 79.0 07/27/2019 0935   HDL 63.00 07/27/2019 0935   CHOLHDL 4 07/27/2019 0935   VLDL 15.8 07/27/2019 0935   LDLCALC 156 (H) 07/27/2019 0935     Risk Assessment/Calculations:      Physical Exam:    VS:  BP 130/60   Pulse 81   Ht 5\' 6"  (1.676 m)   Wt 159 lb (72.1 kg)   BMI 25.66 kg/m     Wt Readings from Last 3 Encounters:  08/15/20 159 lb (72.1 kg)  08/12/20 161 lb (73 kg)  07/24/20 159 lb 12 oz (72.5 kg)     GEN:  Well nourished, well developed in no acute distress HEENT: Normal NECK: No JVD; No carotid bruits LYMPHATICS: No lymphadenopathy CARDIAC: RRR, 2/6 systolic murmur, frequent ectopy RESPIRATORY:  Clear to auscultation without rales, wheezing or rhonchi  ABDOMEN: Soft, non-tender, non-distended MUSCULOSKELETAL:  trace edema; varicose veins no deformity  SKIN: Warm and dry NEUROLOGIC:  Alert and oriented x 3 PSYCHIATRIC:  Normal affect   ASSESSMENT:    1. Edema, lower extremity   2. Frequent PVCs   3. Primary hypertension   4. Murmur, cardiac    PLAN:    In order of problems listed above:  1. Patient with lower extremity edema, varicose veins noted.  Symptoms likely secondary to venous insufficiency.  Leg raising while in seated position, compression stockings advised.  Will get echocardiogram to evaluate cardiac function due to frequent  PVCs and systolic murmur.  Has no chest pain or shortness of breath. 2. Patient with frequent PVCs, bigeminy.  Echo as above. 3. Hypertension, blood pressure controlled, continue amlodipine 5 mg daily. 4. Faint systolic murmur on exam, echocardiogram to evaluate any significant valvular abnormalities.  Follow-up after echocardiogram.    Shared Decision Making/Informed Consent      Medication Adjustments/Labs and Tests Ordered: Current medicines are reviewed at length with the patient today.  Concerns regarding medicines are outlined above.  Orders Placed This Encounter  Procedures  . EKG 12-Lead  . ECHOCARDIOGRAM COMPLETE   No orders of the defined types were placed in this encounter.   Patient Instructions  Medication Instructions:   Your physician recommends that you continue on your current medications as directed. Please refer to the Current Medication list given to you today.  *If you need a refill on your cardiac medications before your next appointment, please call your pharmacy*   Lab Work: None Ordered If you have labs (blood work) drawn today and your tests are completely normal, you will receive your results only by: [name] MyChart Message (if you have MyChart) OR . A paper copy in the mail If you have any lab test that is abnormal or we need to change your treatment, we will call you to review the results.   Testing/Procedures:  Your physician has requested that you have an echocardiogram. Echocardiography is a painless test that uses sound waves to create images of your heart. It provides your doctor with information about the size and shape of your heart and how well your heart's chambers and valves are working. This procedure takes approximately one hour. There are no restrictions for this procedure.    Follow-Up: At [facility], you and your health needs are our priority.  As part of our continuing mission to provide you with exceptional heart care, we have  created designated Provider Care Teams.  These Care Teams include your primary Cardiologist (physician) and Advanced Practice Providers (APPs -  Physician Assistants and Nurse Practitioners) who all work together to provide you with the care you need, when you need it.  We recommend signing up for the patient portal called "MyChart".  Sign up information is provided on this After Visit Summary.  MyChart is used to connect with patients for Virtual Visits (Telemedicine).  Patients are able to view lab/test results, encounter notes, upcoming appointments, etc.  Non-urgent messages can be sent to your provider as well.   To learn more about what you can do with MyChart, go to [email or web address].    Your next appointment:   Follow up after Echo   The format for your next appointment:   In Person  Provider:   Kate Sable, MD   Other Instructions   Echocardiogram An echocardiogram is a procedure that uses painless sound waves (ultrasound) to produce an image of the heart. Images from an echocardiogram can provide important information about:  Signs of coronary artery disease (CAD).  Aneurysm detection. An aneurysm is a weak or damaged part of an artery wall that bulges out from the normal force of blood pumping through the body.  Heart size and shape. Changes in the size or shape of the heart can be associated with certain conditions, including heart failure, aneurysm, and CAD.  Heart muscle function.  Heart valve function.  Signs of a past heart attack.  Fluid buildup around the heart.  Thickening of the heart muscle.  A tumor or infectious growth around the heart valves. Tell a health care provider about:  Any allergies you have.  All medicines you are taking, including vitamins, herbs, eye drops, creams, and over-the-counter medicines.  Any blood disorders you have.  Any surgeries you have had.  Any medical conditions you have.  Whether you are pregnant or  may be pregnant. What are the risks? Generally, this is a safe procedure. However, problems may occur, including:  Allergic reaction to dye (contrast) that may be used during the procedure. What happens before the procedure? No specific preparation is needed. You may eat and drink normally. What happens during the procedure?   An IV tube may be inserted into one of your veins.  You may receive contrast through this tube. A contrast is an injection that improves the quality of the pictures from your heart.  A gel will be applied to your chest.  A wand-like tool (transducer) will be moved over your chest. The gel will help to transmit the sound waves from the transducer.  The sound waves will harmlessly bounce off of your heart to allow the heart images to be captured in real-time motion. The images will be recorded on a computer. The procedure may vary among health care providers and hospitals. What happens after the procedure?  You may return to your normal, everyday life, including diet, activities, and medicines, unless your health care provider tells you not to do that. Summary  An echocardiogram is a procedure that uses painless sound waves (ultrasound) to produce an image of the heart.  Images from an echocardiogram can provide important information about the size and shape of your heart, heart muscle function, heart valve function, and fluid buildup around your heart.  You do not need to do anything to prepare before this procedure. You may eat and drink normally.  After the echocardiogram is completed, you may return to your normal, everyday life, unless your health care provider  tells you not to do that. This information is not intended to replace advice given to you by your health care provider. Make sure you discuss any questions you have with your health care provider. Document Revised: 01/12/2019 Document Reviewed: 10/24/2016 Elsevier Patient Education  2020 [place], Kate Sable, MD  08/15/2020 12:49 PM    [place]

## 2020-08-15 NOTE — Patient Instructions (Signed)
Medication Instructions:   Your physician recommends that you continue on your current medications as directed. Please refer to the Current Medication list given to you today.  *If you need a refill on your cardiac medications before your next appointment, please call your pharmacy*   Lab Work: None Ordered If you have labs (blood work) drawn today and your tests are completely normal, you will receive your results only by: [name] MyChart Message (if you have MyChart) OR . A paper copy in the mail If you have any lab test that is abnormal or we need to change your treatment, we will call you to review the results.   Testing/Procedures:  Your physician has requested that you have an echocardiogram. Echocardiography is a painless test that uses sound waves to create images of your heart. It provides your doctor with information about the size and shape of your heart and how well your heart's chambers and valves are working. This procedure takes approximately one hour. There are no restrictions for this procedure.    Follow-Up: At [facility], you and your health needs are our priority.  As part of our continuing mission to provide you with exceptional heart care, we have created designated Provider Care Teams.  These Care Teams include your primary Cardiologist (physician) and Advanced Practice Providers (APPs -  Physician Assistants and Nurse Practitioners) who all work together to provide you with the care you need, when you need it.  We recommend signing up for the patient portal called "MyChart".  Sign up information is provided on this After Visit Summary.  MyChart is used to connect with patients for Virtual Visits (Telemedicine).  Patients are able to view lab/test results, encounter notes, upcoming appointments, etc.  Non-urgent messages can be sent to your provider as well.   To learn more about what you can do with MyChart, go to [email or web address].    Your next appointment:     Follow up after Echo   The format for your next appointment:   In Person  Provider:   Kate Sable, MD   Other Instructions   Echocardiogram An echocardiogram is a procedure that uses painless sound waves (ultrasound) to produce an image of the heart. Images from an echocardiogram can provide important information about:  Signs of coronary artery disease (CAD).  Aneurysm detection. An aneurysm is a weak or damaged part of an artery wall that bulges out from the normal force of blood pumping through the body.  Heart size and shape. Changes in the size or shape of the heart can be associated with certain conditions, including heart failure, aneurysm, and CAD.  Heart muscle function.  Heart valve function.  Signs of a past heart attack.  Fluid buildup around the heart.  Thickening of the heart muscle.  A tumor or infectious growth around the heart valves. Tell a health care provider about:  Any allergies you have.  All medicines you are taking, including vitamins, herbs, eye drops, creams, and over-the-counter medicines.  Any blood disorders you have.  Any surgeries you have had.  Any medical conditions you have.  Whether you are pregnant or may be pregnant. What are the risks? Generally, this is a safe procedure. However, problems may occur, including:  Allergic reaction to dye (contrast) that may be used during the procedure. What happens before the procedure? No specific preparation is needed. You may eat and drink normally. What happens during the procedure?   An IV tube may be inserted into one of  your veins.  You may receive contrast through this tube. A contrast is an injection that improves the quality of the pictures from your heart.  A gel will be applied to your chest.  A wand-like tool (transducer) will be moved over your chest. The gel will help to transmit the sound waves from the transducer.  The sound waves will harmlessly bounce off  of your heart to allow the heart images to be captured in real-time motion. The images will be recorded on a computer. The procedure may vary among health care providers and hospitals. What happens after the procedure?  You may return to your normal, everyday life, including diet, activities, and medicines, unless your health care provider tells you not to do that. Summary  An echocardiogram is a procedure that uses painless sound waves (ultrasound) to produce an image of the heart.  Images from an echocardiogram can provide important information about the size and shape of your heart, heart muscle function, heart valve function, and fluid buildup around your heart.  You do not need to do anything to prepare before this procedure. You may eat and drink normally.  After the echocardiogram is completed, you may return to your normal, everyday life, unless your health care provider tells you not to do that. This information is not intended to replace advice given to you by your health care provider. Make sure you discuss any questions you have with your health care provider. Document Revised: 01/12/2019 Document Reviewed: 10/24/2016 Elsevier Patient Education  [place].

## 2020-08-27 ENCOUNTER — Telehealth: Payer: Self-pay | Admitting: Family Medicine

## 2020-08-27 ENCOUNTER — Other Ambulatory Visit: Payer: Self-pay

## 2020-08-27 ENCOUNTER — Ambulatory Visit (INDEPENDENT_AMBULATORY_CARE_PROVIDER_SITE_OTHER): Payer: PPO

## 2020-08-27 DIAGNOSIS — I493 Ventricular premature depolarization: Secondary | ICD-10-CM | POA: Diagnosis not present

## 2020-08-27 LAB — ECHOCARDIOGRAM COMPLETE
Area-P 1/2: 3.34 cm2
MV M vel: 5.7 m/s
MV Peak grad: 130 mmHg
P 1/2 time: 425 msec
S' Lateral: 3 cm

## 2020-08-27 NOTE — Telephone Encounter (Signed)
Spoke with Barbara Marks and advised that we spoke on the phone on 08/13/2020 and went over her lab results. We discussed her labs again today.  She also stated she couldn't find her results on her MyChart.  I walked her through the steps to view her results on her MyChart.  She did see the cardiologist on 08/15/20 and had an ECHO done today.  Awaiting results.

## 2020-08-27 NOTE — Telephone Encounter (Signed)
Pt called in wanted to get the results of her blood work on 08/12/20

## 2020-08-28 ENCOUNTER — Telehealth: Payer: Self-pay | Admitting: Cardiology

## 2020-08-28 NOTE — Telephone Encounter (Signed)
Spoke with patient and gave her Echo results.

## 2020-08-28 NOTE — Telephone Encounter (Signed)
Please call with echocardiogram results.  

## 2020-09-02 ENCOUNTER — Ambulatory Visit: Payer: PPO | Admitting: Cardiovascular Disease

## 2020-09-09 ENCOUNTER — Other Ambulatory Visit: Payer: PPO

## 2020-09-12 ENCOUNTER — Other Ambulatory Visit: Payer: Self-pay

## 2020-09-12 ENCOUNTER — Encounter: Payer: Self-pay | Admitting: Cardiology

## 2020-09-12 ENCOUNTER — Ambulatory Visit: Payer: PPO | Admitting: Cardiology

## 2020-09-12 VITALS — BP 140/80 | HR 56 | Ht 66.0 in | Wt 156.0 lb

## 2020-09-12 DIAGNOSIS — I10 Essential (primary) hypertension: Secondary | ICD-10-CM

## 2020-09-12 DIAGNOSIS — I351 Nonrheumatic aortic (valve) insufficiency: Secondary | ICD-10-CM

## 2020-09-12 DIAGNOSIS — I493 Ventricular premature depolarization: Secondary | ICD-10-CM | POA: Diagnosis not present

## 2020-09-12 DIAGNOSIS — R600 Localized edema: Secondary | ICD-10-CM | POA: Diagnosis not present

## 2020-09-12 NOTE — Progress Notes (Signed)
Cardiology Office Note:    Date:  09/12/2020   ID:  Barbara Marks, DOB 08-05-1934, MRN [id number]  PCP:  Lesleigh Noe, MD  [facility] Cardiologist:  Kate Sable, MD  [place] Electrophysiologist:  None   Referring MD: Lesleigh Noe, MD   Chief Complaint  Patient presents with  . Other    Office visit-F/U after echo    History of Present Illness:    Barbara Marks is a 86 y.o. female with a hx of hypertension, hyperlipidemia, symptomatic bradycardia status post permanent pacemaker 2006, GEN change 2011 depression, anxiety who presents for follow-up.  Last seen due to lower extremity swelling.  EKG showed frequent PVCs.  Echocardiogram was ordered to evaluate cardiac function.    She states falling off a ladder in her house yesterday, has some back muscle soreness.  Otherwise has no new cardiac complaints or concerns.  Plans to follow-up with primary care provider if back discomfort does not improve.  Prior notes  Patient saw sports medicine physician on 08/12/2020 due to worsening left ankle swelling which is new.  Swelling ongoing for the past several weeks.  Swelling has gotten better over the past 3 days.  Swelling stays about the same throughout the day.   Echocardiogram 2014 with normal EF, 50-55, impaired relaxation.[name]  History of myalgias with statins she was in the [place] previously for about 20 years.  She states her pacemaker was placed due to recurrent PVCs.  When [place] the [place], it was discovered she never needed a pacemaker due to PVCs.  Her pacemaker was "turned off".  Has not followed up in the clinic since 2018.  Past Medical History:  Diagnosis Date  . Anxiety   . CAD (coronary artery disease) Non-obstructive   a. 2004 Cath: nonobs dzs.  . DDD (degenerative disc disease)    a. with chronic right sided back pain - improves after seeing chiropractor.  . Depression    a. h/o ECT  . Diverticulitis   . Glaucoma   . HTN  (hypertension)   . Hyperlipidemia, mixed   . Melanoma of skin ([place]) 2008   resected from Left arm   . Memory problem    "states memory issues"  . Nephrolithiasis   . Pancreatic cyst    a. 18299 Endoscopic U/S: nl UGI tract, 1-47mm pancreatic cysts, no masses/nodules.  . Premature ventricular contraction    a. managed with propafenone.  . Presence of permanent cardiac pacemaker   . Symptomatic bradycardia    a. s/p MDT PPM in 08/2005;  b. 02/2010 Gen change->MDT Adapta DC PPM, ser # [id number].    Past Surgical History:  Procedure Laterality Date  . APPENDECTOMY  1952  . CATARACT EXTRACTION, BILATERAL    . CHOLECYSTECTOMY  1983  . EUS N/A 06/22/2013   Procedure: UPPER ENDOSCOPIC ULTRASOUND (EUS) LINEAR;  Surgeon: Milus Banister, MD;  Location: [facility];  Service: Endoscopy;  Laterality: N/A;  . EYE SURGERY Bilateral    Cataract Extraction with IOL  . PACEMAKER INSERTION Left    [employer]  . TUMOR EXCISION     And nemamgeomas    Current Medications: Current Meds  Medication Sig  . amLODipine (NORVASC) 5 MG tablet TAKE 1 TABLET BY MOUTH IN THE MORNING  . B Complex Vitamins (B COMPLEX PO) Take by mouth daily.  [name] MAGNESIUM PO Take by mouth daily.  . Multiple Vitamin (MULTIVITAMIN) tablet Take 1 tablet by mouth daily.    [name]  Multiple Vitamins-Minerals (PRESERVISION AREDS) TABS Take 1 tablet by mouth in the morning and at bedtime.   . Naproxen Sodium (ALEVE PO) Take by mouth as needed (pain).  . potassium chloride (KLOR-CON) 10 MEQ tablet Take 1 tablet by mouth once daily  . Red Yeast Rice 600 MG CAPS 4 capsules daily  . TURMERIC PO Take by mouth daily.  [name] Ubiquinol 100 MG CAPS Take by mouth daily.     Allergies:   Codeine, Influenza vaccines, and Simvastatin   Social History   Socioeconomic History  . Marital status: Widowed    Spouse name: Not on file  . Number of children: 3  . Years of education: some college  . Highest education level: Not on file  Occupational  History  . Occupation: Retired    Fish farm manager: RETIRED  Tobacco Use  . Smoking status: Never Smoker  . Smokeless tobacco: Never Used  Vaping Use  . Vaping Use: Never used  Substance and Sexual Activity  . Alcohol use: No  . Drug use: No  . Sexual activity: Not Currently  Other Topics Concern  . Not on file  Social History Narrative   Lives in [place]. Lives with a friend (also 10 year - Paul).    3 Kids, 7 grandchildren, 1 great-grandchild   Enjoys: movies on Friday with friends, walks   Exercise: walking around the house for exercise   Diet: likes everything   Social Determinants of Health   Financial Resource Strain: Not on file  Food Insecurity: Not on file  Transportation Needs: Not on file  Physical Activity: Not on file  Stress: Not on file  Social Connections: Not on file     Family History: The patient's family history includes Kidney cancer in her brother; Other in her mother; Stroke in her father.  ROS:   Please see the history of present illness.     All other systems reviewed and are negative.  EKGs/Labs/Other Studies Reviewed:    The following studies were reviewed today:   EKG:  EKG not  ordered today.   Recent Labs: 08/12/2020: ALT 11; BUN 13; Creatinine, Ser 0.79; Hemoglobin 12.4; Platelets 198.0; Potassium 3.6; Pro B Natriuretic peptide (BNP) 129.0; Sodium 139  Recent Lipid Panel    Component Value Date/Time   CHOL 235 (H) 07/27/2019 0935   TRIG 79.0 07/27/2019 0935   HDL 63.00 07/27/2019 0935   CHOLHDL 4 07/27/2019 0935   VLDL 15.8 07/27/2019 0935   LDLCALC 156 (H) 07/27/2019 0935     Risk Assessment/Calculations:      Physical Exam:    VS:  BP 140/80 (BP Location: Left Arm, Patient Position: Sitting, Cuff Size: Normal)   Pulse (!) 56   Ht 5\' 6"  (1.676 m)   Wt 156 lb (70.8 kg)   SpO2 98%   BMI 25.18 kg/m     Wt Readings from Last 3 Encounters:  09/12/20 156 lb (70.8 kg)  08/15/20 159 lb (72.1 kg)  08/12/20 161 lb (73 kg)      GEN:  Well nourished, well developed in no acute distress HEENT: Normal NECK: No JVD; No carotid bruits LYMPHATICS: No lymphadenopathy CARDIAC: RRR, 2/6 systolic murmur, frequent ectopy RESPIRATORY:  Clear to auscultation without rales, wheezing or rhonchi  ABDOMEN: Soft, non-tender, non-distended MUSCULOSKELETAL:  trace edema; varicose veins, mild lumbar discomfort with stretching SKIN: Warm and dry NEUROLOGIC:  Alert and oriented x 3 PSYCHIATRIC:  Normal affect   ASSESSMENT:    1. Edema, lower extremity  2. Frequent PVCs   3. Primary hypertension   4. Aortic valve insufficiency, etiology of cardiac valve disease unspecified    PLAN:    In order of problems listed above:  1. Patient with lower extremity edema, varicose veins noted.  Continue compression stockings, leg raise while in seated position. 2. Patient with frequent PVCs, bigeminy.  Echo with mildly reduced EF.  Not significantly different from prior.  Aortic valve sclerosis, EF 45 to 50%.  Heart rate on the low side, preventing beta-blockers or amiodarone for suppression.  She has had these for years now.  Continue to monitor. 3. Hypertension, amlodipine 5 mg daily 4. Mild aortic valve regurgitation.  Serial echoes every 3 to 5 years for monitoring.  Follow-up in 1 year    Shared Decision Making/Informed Consent      Medication Adjustments/Labs and Tests Ordered: Current medicines are reviewed at length with the patient today.  Concerns regarding medicines are outlined above.  No orders of the defined types were placed in this encounter.  No orders of the defined types were placed in this encounter.   Patient Instructions  Medication Instructions:  Your physician recommends that you continue on your current medications as directed. Please refer to the Current Medication list given to you today.  *If you need a refill on your cardiac medications before your next appointment, please call your  pharmacy*  Follow-Up: At [facility], you and your health needs are our priority.  As part of our continuing mission to provide you with exceptional heart care, we have created designated Provider Care Teams.  These Care Teams include your primary Cardiologist (physician) and Advanced Practice Providers (APPs -  Physician Assistants and Nurse Practitioners) who all work together to provide you with the care you need, when you need it.  We recommend signing up for the patient portal called "MyChart".  Sign up information is provided on this After Visit Summary.  MyChart is used to connect with patients for Virtual Visits (Telemedicine).  Patients are able to view lab/test results, encounter notes, upcoming appointments, etc.  Non-urgent messages can be sent to your provider as well.   To learn more about what you can do with MyChart, go to [email or web address].    Your next appointment:   12 month(s)  The format for your next appointment:   In Person  Provider:   You may see Kate Sable, MD or one of the following Advanced Practice Providers on your designated Care Team:    Murray Hodgkins, NP  Christell Faith, PA-C  Marrianne Mood, PA-C  Cadence [place], [place]  Laurann [place], NP     Signed, Kate Sable, MD  09/12/2020 12:56 PM    [place]

## 2020-09-12 NOTE — Patient Instructions (Signed)
Medication Instructions:  Your physician recommends that you continue on your current medications as directed. Please refer to the Current Medication list given to you today.  *If you need a refill on your cardiac medications before your next appointment, please call your pharmacy*   Follow-Up: At [facility], you and your health needs are our priority.  As part of our continuing mission to provide you with exceptional heart care, we have created designated Provider Care Teams.  These Care Teams include your primary Cardiologist (physician) and Advanced Practice Providers (APPs -  Physician Assistants and Nurse Practitioners) who all work together to provide you with the care you need, when you need it.  We recommend signing up for the patient portal called "MyChart".  Sign up information is provided on this After Visit Summary.  MyChart is used to connect with patients for Virtual Visits (Telemedicine).  Patients are able to view lab/test results, encounter notes, upcoming appointments, etc.  Non-urgent messages can be sent to your provider as well.   To learn more about what you can do with MyChart, go to [email or web address].    Your next appointment:   12 month(s)  The format for your next appointment:   In Person  Provider:   You may see Brian Agbor-Etang, MD or one of the following Advanced Practice Providers on your designated Care Team:    Christopher Berge, NP  Ryan Dunn, PA-C  Jacquelyn Visser, PA-C  Cadence Furth, PA-C  Caitlin Walker, NP   

## 2020-09-14 ENCOUNTER — Encounter: Payer: Self-pay | Admitting: Emergency Medicine

## 2020-09-14 ENCOUNTER — Emergency Department
Admission: EM | Admit: 2020-09-14 | Discharge: 2020-09-14 | Disposition: A | Discharge status: Home / Self Care | Payer: PPO | Attending: Emergency Medicine | Admitting: Emergency Medicine

## 2020-09-14 ENCOUNTER — Other Ambulatory Visit: Payer: Self-pay

## 2020-09-14 DIAGNOSIS — M545 Low back pain, unspecified: Secondary | ICD-10-CM | POA: Diagnosis not present

## 2020-09-14 DIAGNOSIS — W108XXA Fall (on) (from) other stairs and steps, initial encounter: Secondary | ICD-10-CM | POA: Diagnosis not present

## 2020-09-14 DIAGNOSIS — Z5321 Procedure and treatment not carried out due to patient leaving prior to being seen by health care provider: Secondary | ICD-10-CM | POA: Diagnosis not present

## 2020-09-14 LAB — COMPREHENSIVE METABOLIC PANEL
ALT: 18 U/L (ref 0–44)
AST: 27 U/L (ref 15–41)
Albumin: 3.7 g/dL (ref 3.5–5.0)
Alkaline Phosphatase: 63 U/L (ref 38–126)
Anion gap: 11 (ref 5–15)
BUN: 27 mg/dL — ABNORMAL HIGH (ref 8–23)
CO2: 27 mmol/L (ref 22–32)
Calcium: 9.4 mg/dL (ref 8.9–10.3)
Chloride: 100 mmol/L (ref 98–111)
Creatinine, Ser: 0.76 mg/dL (ref 0.44–1.00)
GFR, Estimated: >60 mL/min (ref >60)
Glucose, Bld: 116 mg/dL — ABNORMAL HIGH (ref 70–99)
Potassium: 3.7 mmol/L (ref 3.5–5.1)
Sodium: 138 mmol/L (ref 135–145)
Total Bilirubin: 1.3 mg/dL — ABNORMAL HIGH (ref 0.3–1.2)
Total Protein: 6.9 g/dL (ref 6.5–8.1)

## 2020-09-14 LAB — CBC
HCT: 38.4 % (ref 36.0–46.0)
Hemoglobin: 12.5 g/dL (ref 12.0–15.0)
MCH: 30.2 pg (ref 26.0–34.0)
MCHC: 32.6 g/dL (ref 30.0–36.0)
MCV: 92.8 fL (ref 80.0–100.0)
Platelets: 165 10*3/uL (ref 150–400)
RBC: 4.14 MIL/uL (ref 3.87–5.11)
RDW: 13.2 % (ref 11.5–15.5)
WBC: 7.3 10*3/uL (ref 4.0–10.5)
nRBC: 0 % (ref 0.0–0.2)

## 2020-09-14 NOTE — ED Notes (Signed)
Pt called x's, no response

## 2020-09-14 NOTE — ED Triage Notes (Signed)
C/O falling off bottom step of a ladder two days ago.  States slipped, fell back onto back onto carpeted floor.  C/O lower back and side pain.  States unable to void x 2 days.

## 2020-09-14 NOTE — ED Triage Notes (Signed)
Pt called from [facility] to treatment room, no response 

## 2020-09-14 NOTE — ED Triage Notes (Signed)
Pt called from [facility] to treatment room, no response ?

## 2020-09-16 ENCOUNTER — Telehealth: Payer: Self-pay

## 2020-09-16 ENCOUNTER — Ambulatory Visit: Payer: PPO

## 2020-09-16 ENCOUNTER — Telehealth: Payer: Self-pay | Admitting: Emergency Medicine

## 2020-09-16 NOTE — Telephone Encounter (Addendum)
Called patient due to left emergency department before provider exam to inquire about condition and follow up plans.  She has appointment  Tomorrow with her doctor.  She has not been running a fever.  She is not vomiting and able to keep fluids down.  Continues to have back pain.   [facility] and her back is not broken.  I told her to make sure her doctor reviews labs from here. I also told her that we did not have a urine test on her.  I told her if she gets sicker--espeically vomiting or fever, she needs to return to the ED.

## 2020-09-16 NOTE — Telephone Encounter (Signed)
Please see note from [facility] ED to pt; per appt notes pt has appt with Avie Echevaria NP on 09/17/20 at 9 AM and has CPX scheduled on 09/18/20 at 11:40 with Dr Einar Pheasant. Sending to Avie Echevaria NP and Dr Einar Pheasant.

## 2020-09-16 NOTE — Telephone Encounter (Signed)
Barbara Marks - Client TELEPHONE ADVICE RECORD Access[facility]urse Patient Name: Barbara Marks Gender: Female DOB: 30-Jun-1934 Age: 86 Y 22 D Return Phone Number: [phone] (Primary) Address: City/State/Zip: [place] [address] Client [place] Marks - Client Client Site [place] - Marks Physician Barbara Marks- MD Contact Type Call Who Is Calling Patient / Member / Family / Caregiver Call Type Triage / Clinical Relationship To Patient Self Return Phone Number [phone] (Primary) Chief Complaint Walking difficulty Reason for Call Symptomatic / Request for Health Information Initial Comment caller states that she is having pains on both her sides. She fell off a ladder last Wednesday. She went to urgent care to get xrays. She is still experiencing the pain on both sides of kidneys even though nothing is broken. She is having difficulty walking. She has an appointment this Wednesday but wanted help sooner. She was expecting a call from doctor today. She was prescribed meloxicam to help with pain. Translation No Nurse Assessment Nurse: Barbara Ranger, RN, Barbara Marks Date/Time (Eastern Time): 09/16/2020 1:59:29 PM Confirm and document reason for call. If symptomatic, describe symptoms. ---caller states that she is having pains on both her sides. She fell off a ladder last Wednesday. She went to urgent care to get xrays. She is still experiencing the pain on both sides of kidneys even though nothing is broken. She is having difficulty walking. She has an appointment this Wednesday but wanted help sooner. She was expecting a call from doctor today. She was prescribed meloxicam to help with pain. Pt states she has pain in flank area bilat and her urine output is decreased. Does the patient have any new or worsening symptoms? ---Yes Will a triage be completed? ---Yes Related visit to physician within the last 2 weeks?  ---Yes Does the PT have any chronic conditions? (i.e. diabetes, asthma, this includes High risk factors for pregnancy, etc.) ---No Is this a behavioral health or substance abuse call? ---No Guidelines Guideline Title Affirmed Question Affirmed Notes Nurse Date/Time (Eastern Time) Back Injury [1] SEVERE PAIN in kidney area (flank) AND Carmon, RN, Barbara Marks 09/16/2020 2:01:00 PM PLEASE NOTE: All timestamps contained within this report are represented as [place] Standard Time. CONFIDENTIALTY NOTICE: This fax transmission is intended only for the addressee. It contains information that is legally privileged, confidential or otherwise protected from use or disclosure. If you are not the intended recipient, you are strictly prohibited from reviewing, disclosing, copying using or disseminating any of this information or taking any action in reliance on or regarding this information. If you have received this fax in error, please notify us immediately by telephone so that we can arrange for its return to [place]. Phone: [phone], Toll-Free: [phone], Fax: [phone] Page: 2 of 2 Call Id: [id number] Guidelines Guideline Title Affirmed Question Affirmed Notes Nurse Date/Time Barbara Marks Time) [2] follows direct blow to that site Disp. Time Barbara Marks Time) Disposition Final User 09/16/2020 2:08:24 PM Call Completed Barbara Ranger, RN, Barbara Marks 09/16/2020 2:02:50 PM Go to ED Now Yes Barbara Ranger, RN, Barbara Marks Disagree/Comply Disagree Caller Understands Yes PreDisposition Call Doctor Care Advice Given Per Guideline GO TO ED NOW: CARE ADVICE given per Back Injury (Adult) guideline. Comments User: Barbara Apple, RN Date/Time Barbara Marks Time): 09/16/2020 2:06:07 PM Called [facility] BL to report pt refusal to go to ER. NO answer after mult rings then got fast busy signal. Returned call to the pt and trans her to the [facility] for appt. Referrals REFERRED TO PCP OFFICE

## 2020-09-17 ENCOUNTER — Ambulatory Visit (INDEPENDENT_AMBULATORY_CARE_PROVIDER_SITE_OTHER): Payer: PPO | Admitting: Internal Medicine

## 2020-09-17 ENCOUNTER — Other Ambulatory Visit: Payer: Self-pay

## 2020-09-17 ENCOUNTER — Ambulatory Visit (INDEPENDENT_AMBULATORY_CARE_PROVIDER_SITE_OTHER)
Admission: RE | Admit: 2020-09-17 | Discharge: 2020-09-17 | Disposition: A | Discharge status: Home / Self Care | Payer: PPO | Source: Ambulatory Visit | Attending: Internal Medicine | Admitting: Internal Medicine

## 2020-09-17 ENCOUNTER — Encounter: Payer: Self-pay | Admitting: Internal Medicine

## 2020-09-17 VITALS — BP 124/78 | HR 80 | Temp 97.2°F | Wt 155.0 lb

## 2020-09-17 DIAGNOSIS — M546 Pain in thoracic spine: Secondary | ICD-10-CM

## 2020-09-17 DIAGNOSIS — I700 Atherosclerosis of aorta: Secondary | ICD-10-CM | POA: Diagnosis not present

## 2020-09-17 DIAGNOSIS — M545 Low back pain, unspecified: Secondary | ICD-10-CM

## 2020-09-17 DIAGNOSIS — W11XXXD Fall on and from ladder, subsequent encounter: Secondary | ICD-10-CM | POA: Diagnosis not present

## 2020-09-17 LAB — POC URINALSYSI DIPSTICK (AUTOMATED)
Bilirubin, UA: NEGATIVE
Blood, UA: NEGATIVE
Glucose, UA: NEGATIVE
Nitrite, UA: NEGATIVE
Protein, UA: POSITIVE — AB
Spec Grav, UA: 1.02 (ref 1.010–1.025)
Urobilinogen, UA: 0.2 E.U./dL
pH, UA: 7 (ref 5.0–8.0)

## 2020-09-17 MED ORDER — TIZANIDINE HCL 2 MG PO CAPS: 2.0000 mg | ORAL_CAPSULE | Freq: Every evening | ORAL | 0 refills | Status: DC | PRN

## 2020-09-17 MED ORDER — TRAMADOL HCL 50 MG PO TABS: 50.0000 mg | ORAL_TABLET | Freq: Three times a day (TID) | ORAL | 0 refills | Status: AC | PRN

## 2020-09-17 NOTE — Addendum Note (Signed)
Addended by: Lurlean Nanny on: 09/17/2020 04:40 PM   Modules accepted: Orders

## 2020-09-17 NOTE — Telephone Encounter (Signed)
Noted appreciate R Baity seeing patient.

## 2020-09-17 NOTE — Patient Instructions (Signed)

## 2020-09-17 NOTE — Progress Notes (Signed)
Subjective:    Patient ID: Barbara Marks, female    DOB: 01-Feb-1934, 86 y.o.   MRN: [id number]  HPI  Pt presents to the clinic today with c/o mid to low back pain. She reports she fell off a ladder. This occurred 6 days ago. She reports she was on a ladder trying to change filter, when she slipped and fell straight on her back. She describes the pain as sore, achy, sharp and worse with movement. She presented to the ER 12/11 for the same but LWBS due to the wait. CBC and CMET at that time were normal. She went to [facility], xray was normal per her reports. They gave her a RX for Meloxicam which she has been taking but has not been helping. She has also tried heat and ice OTC with minimal relief. She denies notable history of osteoporosis, no bone density to review.   Review of Systems  Past Medical History:  Diagnosis Date  . Anxiety   . CAD (coronary artery disease) Non-obstructive   a. 2004 Cath: nonobs dzs.  . DDD (degenerative disc disease)    a. with chronic right sided back pain - improves after seeing chiropractor.  . Depression    a. h/o ECT  . Diverticulitis   . Glaucoma   . HTN (hypertension)   . Hyperlipidemia, mixed   . Melanoma of skin ([place]) 2008   resected from Left arm   . Memory problem    "states memory issues"  . Nephrolithiasis   . Pancreatic cyst    a. [id number] Endoscopic U/S: nl UGI tract, 1-69mm pancreatic cysts, no masses/nodules.  . Premature ventricular contraction    a. managed with propafenone.  . Presence of permanent cardiac pacemaker   . Symptomatic bradycardia    a. s/p MDT PPM in 08/2005;  b. 02/2010 Gen change->MDT Adapta DC PPM, ser # [id number].    Current Outpatient Medications  Medication Sig Dispense Refill  . amLODipine (NORVASC) 5 MG tablet TAKE 1 TABLET BY MOUTH IN THE MORNING 90 tablet 3  . B Complex Vitamins (B COMPLEX PO) Take by mouth daily.    [name] MAGNESIUM PO Take by mouth daily.    . Multiple Vitamin (MULTIVITAMIN) tablet  Take 1 tablet by mouth daily.      . Multiple Vitamins-Minerals (PRESERVISION AREDS) TABS Take 1 tablet by mouth in the morning and at bedtime.     . Naproxen Sodium (ALEVE PO) Take by mouth as needed (pain).    . potassium chloride (KLOR-CON) 10 MEQ tablet Take 1 tablet by mouth once daily 90 tablet 3  . Red Yeast Rice 600 MG CAPS 4 capsules daily    . TURMERIC PO Take by mouth daily.    [name] Ubiquinol 100 MG CAPS Take by mouth daily.     No current facility-administered medications for this visit.    Allergies  Allergen Reactions  . Codeine     unknown  . Influenza Vaccines Cough, Hypertension and Nausea And Vomiting  . Simvastatin Other (See Comments)    "leg cramps"    Family History  Problem Relation Age of Onset  . Other Mother        died @ 60 - old age.  . Stroke Father        cva after cea in his 56's, died @ 50.  . Kidney cancer Brother     Social History   Socioeconomic History  . Marital status: Widowed    Spouse  name: Not on file  . Number of children: 3  . Years of education: some college  . Highest education level: Not on file  Occupational History  . Occupation: Retired    Fish farm manager: RETIRED  Tobacco Use  . Smoking status: Never Smoker  . Smokeless tobacco: Never Used  Vaping Use  . Vaping Use: Never used  Substance and Sexual Activity  . Alcohol use: No  . Drug use: No  . Sexual activity: Not Currently  Other Topics Concern  . Not on file  Social History Narrative   Lives in [place]. Lives with a friend (also 28 year - Paul).    3 Kids, 7 grandchildren, 1 great-grandchild   Enjoys: movies on Friday with friends, walks   Exercise: walking around the house for exercise   Diet: likes everything   Social Determinants of Health   Financial Resource Strain: Not on file  Food Insecurity: Not on file  Transportation Needs: Not on file  Physical Activity: Not on file  Stress: Not on file  Social Connections: Not on file  Intimate Partner  Violence: Not on file     Constitutional: Denies fever, malaise, fatigue, headache or abrupt weight changes.  Respiratory: Denies difficulty breathing, shortness of breath, cough or sputum production.   Cardiovascular: Denies chest pain, chest tightness, palpitations or swelling in the hands or feet.  Gastrointestinal: Denies abdominal pain, bloating, constipation, diarrhea or blood in the stool.  GU: Denies urgency, frequency, pain with urination, burning sensation, blood in urine, odor or discharge. Musculoskeletal: Pt reports mid-low back pain. Denies decrease in range of motion, difficulty with gait, or joint pain and swelling.  Skin: Denies redness, rashes, lesions or ulcercations.  Neurological: Denies dizziness, difficulty with memory, difficulty with speech or problems with balance and coordination.    No other specific complaints in a complete review of systems (except as listed in HPI above).     Objective:   Physical Exam  BP 124/78   Pulse 80   Temp (!) 97.2 F (36.2 C) (Temporal)   Wt 155 lb (70.3 kg)   SpO2 98%   BMI 25.02 kg/m   Wt Readings from Last 3 Encounters:  09/14/20 156 lb 1.4 oz (70.8 kg)  09/12/20 156 lb (70.8 kg)  08/15/20 159 lb (72.1 kg)    General: Appears her stated age, in NAD. Skin: Warm, dry and intact. Abrasion noted to left lower back. Cardiovascular: Normal rate and rhythm. S1,S2 noted.  No murmur, rubs or gallops noted.  Pulmonary/Chest: Normal effort and positive vesicular breath sounds. No respiratory distress. No wheezes, rales or ronchi noted.  Abdomen: No CVA tenderness noted. Musculoskeletal: Normal flexion and rotation of the spine. Pain with extension and lateral bending of the spine. Bony tenderness noted over the thoracic and lumbar spine, bilateral paralumbar muscles. Strength 4/5 BLE. Gait slow and steady without device. Neurological: Alert and oriented.    BMET    Component Value Date/Time   NA 138 09/14/2020 0921   NA  140 04/18/2018 0000   NA 138 10/21/2012 1438   K 3.7 09/14/2020 0921   K 3.7 10/16/2014 1612   CL 100 09/14/2020 0921   CL 104 10/21/2012 1438   CO2 27 09/14/2020 0921   CO2 29 10/21/2012 1438   GLUCOSE 116 (H) 09/14/2020 0921   GLUCOSE 97 10/21/2012 1438   BUN 27 (H) 09/14/2020 0921   BUN 18 04/18/2018 0000   BUN 14 10/21/2012 1438   CREATININE 0.76 09/14/2020 3335  CREATININE 0.80 10/21/2012 1438   CALCIUM 9.4 09/14/2020 0921   CALCIUM 9.1 10/21/2012 1438   GFRNONAA >60 09/14/2020 0921   GFRNONAA >60 10/21/2012 1438   GFRAA >60 07/06/2016 1007   GFRAA >60 10/21/2012 1438    Lipid Panel     Component Value Date/Time   CHOL 235 (H) 07/27/2019 0935   TRIG 79.0 07/27/2019 0935   HDL 63.00 07/27/2019 0935   CHOLHDL 4 07/27/2019 0935   VLDL 15.8 07/27/2019 0935   LDLCALC 156 (H) 07/27/2019 0935    CBC    Component Value Date/Time   WBC 7.3 09/14/2020 0921   RBC 4.14 09/14/2020 0921   HGB 12.5 09/14/2020 0921   HGB 12.4 10/21/2012 1438   HCT 38.4 09/14/2020 0921   HCT 37.7 10/21/2012 1438   PLT 165 09/14/2020 0921   PLT 198 10/21/2012 1438   MCV 92.8 09/14/2020 0921   MCV 93 10/21/2012 1438   MCH 30.2 09/14/2020 0921   MCHC 32.6 09/14/2020 0921   RDW 13.2 09/14/2020 0921   RDW 12.8 10/21/2012 1438   LYMPHSABS 1.6 08/12/2020 1440   MONOABS 0.6 08/12/2020 1440   EOSABS 0.1 08/12/2020 1440   BASOSABS 0.1 08/12/2020 1440    Hgb A1C No results found for: HGBA1C        Assessment & Plan:   Acute Thoracic Back Pain, Acute Bilateral Low Back Pain s/p Fall:  She would like repeat xrays today Xray thoracic spine noted Xray lumbar spine noted Continue Meloxicam RX for Zanaflex 2 mg QHS prn- sedation caution given RX for Tramadol 50 mg TID prn- sedation caution given Encouraged heat and stretching Urinalysis: 1+ leuks No need for urine culture She should consider bone density to check for osteoporosis should discuss this with PCP  Will follow up after  xrays, return precautions discussed Webb Silversmith, NP This visit occurred during the SARS-CoV-2 public health emergency.  Safety protocols were in place, including screening questions prior to the visit, additional usage of staff PPE, and extensive cleaning of exam room while observing appropriate contact time as indicated for disinfecting solutions.

## 2020-09-18 ENCOUNTER — Ambulatory Visit (INDEPENDENT_AMBULATORY_CARE_PROVIDER_SITE_OTHER): Payer: PPO | Admitting: Family Medicine

## 2020-09-18 ENCOUNTER — Encounter: Payer: Self-pay | Admitting: Family Medicine

## 2020-09-18 VITALS — BP 138/80 | HR 77 | Temp 97.9°F | Ht 65.5 in | Wt 156.5 lb

## 2020-09-18 DIAGNOSIS — S22089A Unspecified fracture of T11-T12 vertebra, initial encounter for closed fracture: Secondary | ICD-10-CM | POA: Insufficient documentation

## 2020-09-18 DIAGNOSIS — S22089D Unspecified fracture of T11-T12 vertebra, subsequent encounter for fracture with routine healing: Secondary | ICD-10-CM | POA: Diagnosis not present

## 2020-09-18 DIAGNOSIS — M545 Low back pain, unspecified: Secondary | ICD-10-CM | POA: Diagnosis not present

## 2020-09-18 DIAGNOSIS — M4854XA Collapsed vertebra, not elsewhere classified, thoracic region, initial encounter for fracture: Secondary | ICD-10-CM | POA: Diagnosis not present

## 2020-09-18 DIAGNOSIS — Z0000 Encounter for general adult medical examination without abnormal findings: Secondary | ICD-10-CM | POA: Diagnosis not present

## 2020-09-18 NOTE — Progress Notes (Signed)
Subjective:   Barbara Marks is a 86 y.o. female who presents for Medicare Annual (Subsequent) preventive examination.  Back fracture - pain medication is helping - is going to call ortho - difficulty going to the bathroom with minimal improvemnet  - fall 1 week ago - back pain since then   Review of Systems    Review of Systems  Constitutional: Negative for chills and fever.  HENT: Negative for congestion and sore throat.   Eyes: Negative for blurred vision and double vision.  Respiratory: Negative for shortness of breath.   Cardiovascular: Negative for chest pain.  Gastrointestinal: Negative for heartburn, nausea and vomiting.  Genitourinary: Negative.   Musculoskeletal: Positive for back pain. Negative for myalgias.  Skin: Negative for rash.  Neurological: Negative for dizziness and headaches.  Endo/Heme/Allergies: Does not bruise/bleed easily.  Psychiatric/Behavioral: Negative for depression. The patient is not nervous/anxious.     Cardiac Risk Factors include: advanced age (>84men, >56 women);sedentary lifestyle     Objective:    Today's Vitals   09/18/20 1144 09/18/20 1154 09/18/20 1157  BP: 138/80    Pulse: 77    Temp: 97.9 F (36.6 C)    TempSrc: Temporal    SpO2: 99%    Weight: 156 lb 8 oz (71 kg)    Height: 5' 5.5" (1.664 m)    PainSc:  3  3    Body mass index is 25.65 kg/m.  Advanced Directives 09/18/2020 09/14/2020 11/19/2015 09/06/2013 06/22/2013  Does Patient Have a Medical Advance Directive? No No Yes Patient has advance directive, copy not in chart Patient has advance directive, copy not in chart  Type of Advance Directive - - [place];Living will Living will -  Does patient want to make changes to medical advance directive? - - No - Patient declined No -  Copy of [place] in Chart? - - - Copy requested from family -  Would patient like information on creating a medical advance directive? Yes  (MAU/Ambulatory/Procedural Areas - Information given) No - Patient declined - - -  Pre-existing out of facility DNR order (yellow form or pink MOST form) - - - No -  Some encounter information is confidential and restricted. Go to Review Flowsheets activity to see all data.    Current Medications (verified) Outpatient Encounter Medications as of 09/18/2020  Medication Sig  . amLODipine (NORVASC) 5 MG tablet TAKE 1 TABLET BY MOUTH IN THE MORNING  . B Complex Vitamins (B COMPLEX PO) Take by mouth daily.  [name] MAGNESIUM PO Take by mouth daily.  . meloxicam (MOBIC) 15 MG tablet Take 15 mg by mouth daily.  . Multiple Vitamin (MULTIVITAMIN) tablet Take 1 tablet by mouth daily.    . Multiple Vitamins-Minerals (PRESERVISION AREDS) TABS Take 1 tablet by mouth in the morning and at bedtime.   . Naproxen Sodium (ALEVE PO) Take by mouth as needed (pain).  . potassium chloride (KLOR-CON) 10 MEQ tablet Take 1 tablet by mouth once daily  . Red Yeast Rice 600 MG CAPS 4 capsules daily  . tizanidine (ZANAFLEX) 2 MG capsule Take 1 capsule (2 mg total) by mouth at bedtime as needed for muscle spasms.  . traMADol (ULTRAM) 50 MG tablet Take 1 tablet (50 mg total) by mouth every 8 (eight) hours as needed for up to 5 days.  . TURMERIC PO Take by mouth daily.  [name] Ubiquinol 100 MG CAPS Take by mouth daily.   No facility-administered encounter medications on  file as of 09/18/2020.    Allergies (verified) Codeine, Influenza vaccines, and Simvastatin   History: Past Medical History:  Diagnosis Date  . Anxiety   . CAD (coronary artery disease) Non-obstructive   a. 2004 Cath: nonobs dzs.  . DDD (degenerative disc disease)    a. with chronic right sided back pain - improves after seeing chiropractor.  . Depression    a. h/o ECT  . Diverticulitis   . Glaucoma   . HTN (hypertension)   . Hyperlipidemia, mixed   . Melanoma of skin ([place]) 2008   resected from Left arm   . Memory problem    "states memory issues"   . Nephrolithiasis   . Pancreatic cyst    a. 45364 Endoscopic U/S: nl UGI tract, 1-42mm pancreatic cysts, no masses/nodules.  . Premature ventricular contraction    a. managed with propafenone.  . Presence of permanent cardiac pacemaker   . Symptomatic bradycardia    a. s/p MDT PPM in 08/2005;  b. 02/2010 Gen change->MDT Adapta DC PPM, ser # [id number].   Past Surgical History:  Procedure Laterality Date  . APPENDECTOMY  1952  . CATARACT EXTRACTION, BILATERAL    . CHOLECYSTECTOMY  1983  . EUS N/A 06/22/2013   Procedure: UPPER ENDOSCOPIC ULTRASOUND (EUS) LINEAR;  Surgeon: Milus Banister, MD;  Location: [facility];  Service: Endoscopy;  Laterality: N/A;  . EYE SURGERY Bilateral    Cataract Extraction with IOL  . PACEMAKER INSERTION Left    [employer]  . TUMOR EXCISION     And nemamgeomas   Family History  Problem Relation Age of Onset  . Other Mother        died @ 49 - old age.  . Stroke Father        cva after cea in his 46's, died @ 1.  . Kidney cancer Brother    Social History   Socioeconomic History  . Marital status: Widowed    Spouse name: Not on file  . Number of children: 3  . Years of education: some college  . Highest education level: Not on file  Occupational History  . Occupation: Retired    Fish farm manager: RETIRED  Tobacco Use  . Smoking status: Never Smoker  . Smokeless tobacco: Never Used  Vaping Use  . Vaping Use: Never used  Substance and Sexual Activity  . Alcohol use: No  . Drug use: No  . Sexual activity: Not Currently  Other Topics Concern  . Not on file  Social History Narrative   Lives in [place]. Lives with a friend (also 84 year - Paul).    3 Kids, 7 grandchildren, 1 great-grandchild   Enjoys: movies on Friday with friends, walks   Exercise: walking around the house for exercise   Diet: likes everything   Social Determinants of Health   Financial Resource Strain: Not on file  Food Insecurity: Not on file  Transportation Needs: Not  on file  Physical Activity: Not on file  Stress: Not on file  Social Connections: Not on file    Tobacco Counseling Counseling given: Not Answered   Clinical Intake:  Pre-visit preparation completed: No  Pain : 0-10 Pain Score: 3  Pain Type: Acute pain Pain Location: Back Pain Orientation: Right Pain Descriptors / Indicators: Aching Pain Onset: In the past 7 days Pain Frequency: Constant Pain Relieving Factors: tramadol  Pain Relieving Factors: tramadol  Nutritional Status: BMI 25 -29 Overweight Nutritional Risks: None Diabetes: No  How often do you need  to have someone help you when you read instructions, pamphlets, or other written materials from your doctor or pharmacy?: 1 - Never What is the last grade level you completed in school?: some college, GED  Diabetic? no  Interpreter Needed?: No      Activities of Daily Living In your present state of health, do you have any difficulty performing the following activities: 09/18/2020  Hearing? N  Vision? N  Difficulty concentrating or making decisions? Y  Walking or climbing stairs? N  Dressing or bathing? N  Doing errands, shopping? N  Preparing Food and eating ? N  Using the Toilet? N  In the past six months, have you accidently leaked urine? Y  Comment using pads just in case  Do you have problems with loss of bowel control? N  Managing your Medications? N  Managing your Finances? N  Housekeeping or managing your Housekeeping? N  Some recent data might be hidden    Patient Care Team: Lesleigh Noe, MD as PCP - General (Family Medicine) Kate Sable, MD as PCP - Cardiology (Cardiology) Clapacs, Madie Reno, MD as Consulting Physician (Psychiatry)  Indicate any recent Medical Services you may have received from other than [facility] providers in the past year (date may be approximate).     Assessment:   This is a routine wellness examination for [facility].  Hearing/Vision screen  Hearing Screening    125Hz  250Hz  500Hz  1000Hz  2000Hz  3000Hz  4000Hz  6000Hz  8000Hz   Right ear:           Left ear:           Vision Screening Comments: Last eye exam was in Feb or March 2021 at [facility]   Dietary issues and exercise activities discussed: Current Exercise Habits: Home exercise routine, Type of exercise: walking, Time (Minutes): 15, Frequency (Times/Week): 7, Weekly Exercise (Minutes/Week): 105, Intensity: Mild, Exercise limited by: None identified  Goals    . Patient Stated     Would like to work on home organization      Depression Screen PHQ 2/9 Scores 09/18/2020 09/18/2020 09/18/2020 08/01/2019  PHQ - 2 Score 0 0 0 1  PHQ- 9 Score - 0 - 2    Fall Risk Fall Risk  09/18/2020 09/18/2020 08/01/2019 05/03/2019  Falls in the past year? 1 1 0 (No Data)  Comment - - - Emmi Telephone Survey: data to providers prior to load  Number falls in past yr: 0 0 0 (No Data)  Comment - - - Emmi Telephone Survey Actual Response =   Injury with Fall? 1 1 0 -  Risk for fall due to : (No Data) - - -  Risk for fall due to: Comment fell off ladder - - -  Follow up Falls prevention discussed;Falls evaluation completed - - -    FALL RISK PREVENTION PERTAINING TO THE HOME:  Any stairs in or around the home? No  If so, are there any without handrails? No  Home free of loose throw rugs in walkways, pet beds, electrical cords, etc? No  Adequate lighting in your home to reduce risk of falls? Yes   ASSISTIVE DEVICES UTILIZED TO PREVENT FALLS:  Life alert? No  Use of a cane, walker or w/c? No  Grab bars in the bathroom? Yes  Shower chair or bench in shower? Yes  Elevated toilet seat or a handicapped toilet? Yes     Cognitive Function: MMSE - Mini Mental State Exam 08/01/2019  Orientation to time 5  Orientation  to Place 5  Registration 3  Attention/ Calculation 5  Recall 2  Language- name 2 objects 2  Language- repeat 1  Language- follow 3 step command 3  Language- read & follow direction  1  Write a sentence 1  Copy design 1  Total score 29  Some encounter information is confidential and restricted. Go to Review Flowsheets activity to see all data.       Mini-Cog - 09/18/20 1204    Normal clock drawing test? yes    How many words correct? 3              Immunizations  There is no immunization history on file for this patient.  Will get vaccines dTap and pneumonia  Flu Vaccine status: Declined, Education has been provided regarding the importance of this vaccine but patient still declined. Advised may receive this vaccine at local pharmacy or Health Dept. Aware to provide a copy of the vaccination record if obtained from local pharmacy or Health Dept. Verbalized acceptance and understanding.   Covid-19 vaccine status: Declined, Education has been provided regarding the importance of this vaccine but patient still declined. Advised may receive this vaccine at local pharmacy or Health Dept.or vaccine clinic. Aware to provide a copy of the vaccination record if obtained from local pharmacy or Health Dept. Verbalized acceptance and understanding.  Qualifies for Shingles Vaccine? No   Zostavax completed No   Shingrix Completed?: No.    Education has been provided regarding the importance of this vaccine. Patient has been advised to call insurance company to determine out of pocket expense if they have not yet received this vaccine. Advised may also receive vaccine at local pharmacy or Health Dept. Verbalized acceptance and understanding.  Screening Tests Health Maintenance  Topic Date Due  . URINE MICROALBUMIN  Never done  . COVID-19 Vaccine (1) Never done  . TETANUS/TDAP  Never done  . DEXA SCAN  Never done  . PNA vac Low Risk Adult (1 of 2 - PCV13) Never done  . INFLUENZA VACCINE  Discontinued    Health Maintenance  Health Maintenance Due  Topic Date Due  . URINE MICROALBUMIN  Never done  . COVID-19 Vaccine (1) Never done  . TETANUS/TDAP  Never done  .  DEXA SCAN  Never done  . PNA vac Low Risk Adult (1 of 2 - PCV13) Never done    Colorectal cancer screening: No longer required.   Mammogram status: No longer required due to age.  Bone Density - declined   Lung Cancer Screening: (Low Dose CT Chest recommended if Age 64-80 years, 30 pack-year currently smoking OR have quit w/in 15years.) does not qualify.   Lung Cancer Screening Referral: n/a  Additional Screening:    Vision Screening: Recommended annual ophthalmology exams for early detection of glaucoma and other disorders of the eye. Is the patient up to date with their annual eye exam?  Yes  Who is the provider or what is the name of the office in which the patient attends annual eye exams? Dr. Baldwin Crown If pt is not established with a provider, would they like to be referred to a provider to establish care? n/a.   Dental Screening: Recommended annual dental exams for proper oral hygiene  Community Resource Referral / Chronic Care Management: CRR required this visit?  No   CCM required this visit?  No      Plan:     Problem List Items Addressed This Visit  Musculoskeletal and Integument   Closed T12 fracture ([place])    Reviewed imaging and signs of osteoporosis. She is not interested in treatment for osteoporosis so will also not get a DEXA at this time. She is planning to f/u with Ortho.        Other Visit Diagnoses    Encounter for Medicare annual wellness exam    -  Primary       I have personally reviewed and noted the following in the patient's chart:   . Medical and social history . Use of alcohol, tobacco or illicit drugs  . Current medications and supplements . Functional ability and status . Nutritional status . Physical activity . Advanced directives . List of other physicians . Hospitalizations, surgeries, and ER visits in previous 12 months . Vitals . Screenings to include cognitive, depression, and falls . Referrals and appointments  In  addition, I have reviewed and discussed with patient certain preventive protocols, quality metrics, and best practice recommendations. A written personalized care plan for preventive services as well as general preventive health recommendations were provided to patient.     Lesleigh Noe, MD   09/18/2020

## 2020-09-18 NOTE — Assessment & Plan Note (Signed)
Reviewed imaging and signs of osteoporosis. She is not interested in treatment for osteoporosis so will also not get a DEXA at this time. She is planning to f/u with Ortho.

## 2020-09-18 NOTE — Patient Instructions (Addendum)
Would recommend the following - for bone health:   1) 800 units of Vitamin D daily 2) Get 1200 mg of elemental calcium --- this is best from your diet. Try to track how much calcium you get on a typical day. You could find ways to add more (dairy products, leafy greens). Take a supplement for whatever you don't typically get so you reach 1200 mg of calcium.  3) Physical activity (ideally weight bearing) - like walking briskly 30 minutes 5 days a week.   If you think you would want to treat possible osteoporosis I would recommend that you get a DEXA scan  Would recommend getting the covid vaccine

## 2020-09-20 DIAGNOSIS — M4854XA Collapsed vertebra, not elsewhere classified, thoracic region, initial encounter for fracture: Secondary | ICD-10-CM | POA: Diagnosis not present

## 2020-10-09 ENCOUNTER — Ambulatory Visit: Payer: PPO

## 2020-10-15 ENCOUNTER — Other Ambulatory Visit: Payer: Self-pay | Admitting: Psychiatry

## 2020-11-04 ENCOUNTER — Other Ambulatory Visit: Payer: Self-pay

## 2020-11-04 ENCOUNTER — Telehealth: Payer: Self-pay | Admitting: Family Medicine

## 2020-11-04 MED ORDER — POTASSIUM CHLORIDE ER 10 MEQ PO TBCR: 10.0000 meq | EXTENDED_RELEASE_TABLET | Freq: Every day | ORAL | 3 refills | Status: DC

## 2020-11-04 MED ORDER — AMLODIPINE BESYLATE 5 MG PO TABS: 5.0000 mg | ORAL_TABLET | Freq: Every morning | ORAL | 3 refills | Status: DC

## 2020-11-04 NOTE — Telephone Encounter (Signed)
Rx's sent in as requested. 

## 2020-11-04 NOTE — Telephone Encounter (Signed)
Patient needs a refill on the following: Amlodipine Potassium chloride Pharmacy: [facility] ( nearest to [employer])   Patient is requesting a 90 day supply

## 2020-11-29 ENCOUNTER — Telehealth: Payer: Self-pay

## 2020-11-29 NOTE — Telephone Encounter (Signed)
Pt called wanting to know what her cholesterol was from her recent Medicare Wellness Exam in December. I advised her that was not checked this year. It was last checked in October 2020. She wanted to know whay it wasn't checked. I told her it may be because she had other labs done prior to the appt and the Lipid panel did not get done. We discussed her last 2 results. She is asking if she needs to have it tested and if she needs to do anything other than Red Yeast Rice for her cholesterol since she cannot take statins.

## 2020-12-02 NOTE — Telephone Encounter (Signed)
Due to her age and since she cannot take statin medications I would recommend continuing the Red Yeast rice.   Would also recommend 30 minutes of exercise 5 or more days per week and a heart healthy diet.

## 2020-12-11 NOTE — Telephone Encounter (Signed)
Called and updated patient of Dr. Verda Cumins recommendations who verbalized understanding.

## 2020-12-13 ENCOUNTER — Other Ambulatory Visit: Payer: Self-pay

## 2020-12-13 ENCOUNTER — Telehealth: Payer: Self-pay

## 2020-12-13 DIAGNOSIS — E876 Hypokalemia: Secondary | ICD-10-CM

## 2020-12-13 MED ORDER — POTASSIUM CHLORIDE ER 10 MEQ PO CPCR: 10.0000 meq | ORAL_CAPSULE | Freq: Every day | ORAL | 0 refills | Status: DC

## 2020-12-13 NOTE — Telephone Encounter (Signed)
2 weeks ago thought saw pill in commode with BM and happened two other times. Pt said the pill seen in commode on 12/12/20 was pts K pill. Pt spoke with pharmacist at [facility] [address] and was advised to have doctor change K to capsule form for better absorption.  Pt will wait for cb after reviewed by Dr Einar Pheasant. Sending note to DR Einar Pheasant and [place] CMA.

## 2020-12-13 NOTE — Telephone Encounter (Signed)
Pt made aware. Would like #90 sent in in the future.

## 2020-12-13 NOTE — Telephone Encounter (Signed)
Can send in powder form of potassium supplement for patient at same dose

## 2020-12-13 NOTE — Telephone Encounter (Signed)
Will send in Capsules for patient to try.   If similar problem will need to consult with pharmacy on recommendations

## 2020-12-13 NOTE — Telephone Encounter (Signed)
Erroneous encounter

## 2020-12-26 ENCOUNTER — Other Ambulatory Visit: Payer: Self-pay | Admitting: Family Medicine

## 2020-12-26 DIAGNOSIS — E876 Hypokalemia: Secondary | ICD-10-CM

## 2021-01-10 ENCOUNTER — Ambulatory Visit
Admission: RE | Admit: 2021-01-10 | Discharge: 2021-01-10 | Disposition: A | Discharge status: Home / Self Care | Payer: PPO | Source: Ambulatory Visit | Attending: Family Medicine | Admitting: Family Medicine

## 2021-01-10 ENCOUNTER — Other Ambulatory Visit: Payer: Self-pay

## 2021-01-10 ENCOUNTER — Ambulatory Visit (INDEPENDENT_AMBULATORY_CARE_PROVIDER_SITE_OTHER): Payer: PPO | Admitting: Family Medicine

## 2021-01-10 ENCOUNTER — Encounter: Payer: Self-pay | Admitting: Family Medicine

## 2021-01-10 VITALS — BP 132/80 | HR 44 | Temp 97.9°F | Ht 65.5 in | Wt 154.5 lb

## 2021-01-10 DIAGNOSIS — W19XXXD Unspecified fall, subsequent encounter: Secondary | ICD-10-CM

## 2021-01-10 DIAGNOSIS — R1030 Lower abdominal pain, unspecified: Secondary | ICD-10-CM

## 2021-01-10 DIAGNOSIS — R109 Unspecified abdominal pain: Secondary | ICD-10-CM | POA: Diagnosis not present

## 2021-01-10 LAB — BASIC METABOLIC PANEL
BUN: 18 mg/dL (ref 6–23)
CO2: 32 mEq/L (ref 19–32)
Calcium: 10.1 mg/dL (ref 8.4–10.5)
Chloride: 103 mEq/L (ref 96–112)
Creatinine, Ser: 0.79 mg/dL (ref 0.40–1.20)
GFR: 68.71 mL/min (ref >60.00)
Glucose, Bld: 101 mg/dL — ABNORMAL HIGH (ref 70–99)
Potassium: 4.1 mEq/L (ref 3.5–5.1)
Sodium: 140 mEq/L (ref 135–145)

## 2021-01-10 LAB — CBC WITH DIFFERENTIAL/PLATELET
Basophils Absolute: 0 10*3/uL (ref 0.0–0.1)
Basophils Relative: 0.7 % (ref 0.0–3.0)
Eosinophils Absolute: 0.1 10*3/uL (ref 0.0–0.7)
Eosinophils Relative: 2.4 % (ref 0.0–5.0)
HCT: 39.4 % (ref 36.0–46.0)
Hemoglobin: 13 g/dL (ref 12.0–15.0)
Lymphocytes Relative: 25.5 % (ref 12.0–46.0)
Lymphs Abs: 1.4 10*3/uL (ref 0.7–4.0)
MCHC: 32.9 g/dL (ref 30.0–36.0)
MCV: 92.9 fl (ref 78.0–100.0)
Monocytes Absolute: 0.4 10*3/uL (ref 0.1–1.0)
Monocytes Relative: 8 % (ref 3.0–12.0)
Neutro Abs: 3.4 10*3/uL (ref 1.4–7.7)
Neutrophils Relative %: 63.4 % (ref 43.0–77.0)
Platelets: 196 10*3/uL (ref 150.0–400.0)
RBC: 4.25 Mil/uL (ref 3.87–5.11)
RDW: 14.5 % (ref 11.5–15.5)
WBC: 5.4 10*3/uL (ref 4.0–10.5)

## 2021-01-10 LAB — POC URINALSYSI DIPSTICK (AUTOMATED)
Bilirubin, UA: NEGATIVE
Blood, UA: NEGATIVE
Glucose, UA: NEGATIVE
Ketones, UA: NEGATIVE
Leukocytes, UA: NEGATIVE
Nitrite, UA: NEGATIVE
Protein, UA: NEGATIVE
Spec Grav, UA: 1.015 (ref 1.010–1.025)
Urobilinogen, UA: 0.2 E.U./dL
pH, UA: 8.5 — AB (ref 5.0–8.0)

## 2021-01-10 MED ORDER — IOHEXOL 300 MG/ML  SOLN
100.0000 mL | Freq: Once | INTRAMUSCULAR | Status: AC | PRN
  Administered 2021-01-10: 100 mL via INTRAVENOUS

## 2021-01-10 NOTE — Patient Instructions (Addendum)
Tylenol as needed for pain.  Avoid aleve.  We will move forward with setting up CT scan of abdomen to rule out diverticulitis.  Please stop at the lab to have labs drawn.

## 2021-01-10 NOTE — Progress Notes (Signed)
Patient ID: Barbara Marks, female    DOB: January 26, 1934, 87 y.o.   MRN: [id number]  This visit was conducted in person.  BP 132/80   Pulse (!) 44   Temp 97.9 F (36.6 C) (Temporal)   Ht 5' 5.5" (1.664 m)   Wt 154 lb 8 oz (70.1 kg)   SpO2 98%   BMI 25.32 kg/m    CC:  Chief Complaint  Patient presents with  . Abdominal Pain    Since fall from Ladder in December    Subjective:   HPI: Barbara Marks is a 87 y.o. female   Patient Dr. Christ Kick presenting on 01/10/2021 for Abdominal Pain (Since fall from Ladder in December)  She reports  She fell off  Step ladder in December... fell flat backward.   Had  Thoracic lumbar X-rays: IMPRESSION: T12 compression deformity similar to that seen on prior thoracic spine film. Degenerative changes of lumbar spine noted.   She has been  Having pain in abdomen around belly button and lower abdomen since falls.  No dysuria, no diarrhea, no constipation when using prune juice. Pain 6-7 /10 constant, hears more gurgling   no diet or med changes.    Occ using aleve.  Relevant past medical, surgical, family and social history reviewed and updated as indicated. Interim medical history since our last visit reviewed. Allergies and medications reviewed and updated. Outpatient Medications Prior to Visit  Medication Sig Dispense Refill  . amLODipine (NORVASC) 5 MG tablet Take 1 tablet (5 mg total) by mouth every morning. 90 tablet 3  . B Complex Vitamins (B COMPLEX PO) Take by mouth daily.    . COLLAGEN PO Take by mouth. 1 scoop powder daily    . MAGNESIUM PO Take by mouth daily.    . Multiple Vitamin (MULTIVITAMIN) tablet Take 1 tablet by mouth daily.      . Multiple Vitamins-Minerals (PRESERVISION AREDS) TABS Take 1 tablet by mouth in the morning and at bedtime.     . naproxen sodium (ALEVE) 220 MG tablet Take 220 mg by mouth daily as needed.    . Polyethylene Glycol 3350 (MIRALAX PO) Take by mouth daily as needed.    . potassium chloride  (MICRO-K) 10 MEQ CR capsule TAKE 1 CAPSULE(10 MEQ) BY MOUTH DAILY 90 capsule 1  . Red Yeast Rice 600 MG CAPS 4 capsules daily    . TURMERIC PO Take by mouth daily.    . meloxicam (MOBIC) 15 MG tablet Take 15 mg by mouth daily.    . Naproxen Sodium (ALEVE PO) Take by mouth as needed (pain).    . tizanidine (ZANAFLEX) 2 MG capsule Take 1 capsule (2 mg total) by mouth at bedtime as needed for muscle spasms. 15 capsule 0  . Ubiquinol 100 MG CAPS Take by mouth daily.     No facility-administered medications prior to visit.     Per HPI unless specifically indicated in ROS section below Review of Systems  Constitutional: Negative for fatigue and fever.  HENT: Negative for congestion.   Eyes: Negative for pain.  Respiratory: Negative for cough and shortness of breath.   Cardiovascular: Negative for chest pain, palpitations and leg swelling.  Gastrointestinal: Positive for abdominal pain.  Genitourinary: Negative for dysuria and vaginal bleeding.  Musculoskeletal: Positive for back pain.  Neurological: Negative for syncope, light-headedness and headaches.  Psychiatric/Behavioral: Negative for dysphoric mood.   Objective:  BP 132/80   Pulse (!) 44   Temp 97.9 F (36.6  C) (Temporal)   Ht 5' 5.5" (1.664 m)   Wt 154 lb 8 oz (70.1 kg)   SpO2 98%   BMI 25.32 kg/m   Wt Readings from Last 3 Encounters:  01/10/21 154 lb 8 oz (70.1 kg)  09/18/20 156 lb 8 oz (71 kg)  09/17/20 155 lb (70.3 kg)      Physical Exam Constitutional:      General: She is not in acute distress.    Appearance: Normal appearance. She is well-developed. She is not ill-appearing or toxic-appearing.  HENT:     Head: Normocephalic.     Right Ear: Hearing, tympanic membrane, ear canal and external ear normal. Tympanic membrane is not erythematous, retracted or bulging.     Left Ear: Hearing, tympanic membrane, ear canal and external ear normal. Tympanic membrane is not erythematous, retracted or bulging.     Nose: No  mucosal edema or rhinorrhea.     Right Sinus: No maxillary sinus tenderness or frontal sinus tenderness.     Left Sinus: No maxillary sinus tenderness or frontal sinus tenderness.     Mouth/Throat:     Pharynx: Uvula midline.  Eyes:     General: Lids are normal. Lids are everted, no foreign bodies appreciated.     Conjunctiva/sclera: Conjunctivae normal.     Pupils: Pupils are equal, round, and reactive to light.  Neck:     Thyroid: No thyroid mass or thyromegaly.     Vascular: No carotid bruit.     Trachea: Trachea normal.  Cardiovascular:     Rate and Rhythm: Normal rate and regular rhythm.     Pulses: Normal pulses.     Heart sounds: Normal heart sounds, S1 normal and S2 normal. No murmur heard. No friction rub. No gallop.   Pulmonary:     Effort: Pulmonary effort is normal. No tachypnea or respiratory distress.     Breath sounds: Normal breath sounds. No decreased breath sounds, wheezing, rhonchi or rales.  Abdominal:     General: Bowel sounds are normal.     Palpations: Abdomen is soft.     Tenderness: There is abdominal tenderness in the suprapubic area and left lower quadrant. There is no guarding or rebound.  Musculoskeletal:     Cervical back: Normal range of motion and neck supple.  Skin:    General: Skin is warm and dry.     Findings: No rash.  Neurological:     Mental Status: She is alert.  Psychiatric:        Mood and Affect: Mood is not anxious or depressed.        Speech: Speech normal.        Behavior: Behavior normal. Behavior is cooperative.        Thought Content: Thought content normal.        Judgment: Judgment normal.       Results for orders placed or performed in visit on 01/10/21  POCT Urinalysis Dipstick (Automated)  Result Value Ref Range   Color, UA Yellow    Clarity, UA Cloudy    Glucose, UA Negative Negative   Bilirubin, UA Negative    Ketones, UA Negative    Spec Grav, UA 1.015 1.010 - 1.025   Blood, UA Negative    pH, UA 8.5 (A) 5.0 -  8.0   Protein, UA Negative Negative   Urobilinogen, UA 0.2 0.2 or 1.0 E.U./dL   Nitrite, UA Negative    Leukocytes, UA Negative Negative    This visit  occurred during the SARS-CoV-2 public health emergency.  Safety protocols were in place, including screening questions prior to the visit, additional usage of staff PPE, and extensive cleaning of exam room while observing appropriate contact time as indicated for disinfecting solutions.   COVID 19 screen:  No recent travel or known exposure to COVID19 The patient denies respiratory symptoms of COVID 19 at this time. The importance of social distancing was discussed today.   Assessment and Plan    Problem List Items Addressed This Visit    Lower abdominal pain - Primary    Not clearly related to fall  Or back injury.   More concerning for possible diverticulitis.  Tylenol as needed for pain.  Avoid aleve.  We will move forward with setting up CT scan of abdomen to rule out diverticulitis.  Please stop at the lab to have labs drawn.       Relevant Orders   POCT Urinalysis Dipstick (Automated) (Completed)   CT Abdomen Pelvis W Contrast (Completed)   Basic Metabolic Panel (Completed)   CBC with Differential/Platelet (Completed)    Other Visit Diagnoses    Fall, subsequent encounter          Eliezer Lofts, MD

## 2021-01-13 ENCOUNTER — Telehealth: Payer: Self-pay

## 2021-01-13 NOTE — Telephone Encounter (Signed)
Patient called wanting to know the results of her CT scan that she had on Friday. Instructed patient that Dr. Diona Browner has not seen them yet, but when she does we will call her to let her know of the results. Patient verbalized understanding.

## 2021-01-30 ENCOUNTER — Telehealth: Payer: Self-pay | Admitting: Podiatry

## 2021-01-30 ENCOUNTER — Other Ambulatory Visit: Payer: Self-pay

## 2021-01-30 ENCOUNTER — Ambulatory Visit: Payer: PPO | Admitting: Podiatry

## 2021-01-30 ENCOUNTER — Encounter: Payer: Self-pay | Admitting: Podiatry

## 2021-01-30 ENCOUNTER — Other Ambulatory Visit
Admission: RE | Admit: 2021-01-30 | Discharge: 2021-01-30 | Disposition: A | Discharge status: Home / Self Care | Payer: PPO | Source: Ambulatory Visit | Attending: Psychiatry | Admitting: Psychiatry

## 2021-01-30 DIAGNOSIS — Z01812 Encounter for preprocedural laboratory examination: Secondary | ICD-10-CM | POA: Diagnosis not present

## 2021-01-30 DIAGNOSIS — Z20822 Contact with and (suspected) exposure to covid-19: Secondary | ICD-10-CM | POA: Diagnosis not present

## 2021-01-30 DIAGNOSIS — M2041 Other hammer toe(s) (acquired), right foot: Secondary | ICD-10-CM | POA: Diagnosis not present

## 2021-01-30 LAB — SARS CORONAVIRUS 2 (TAT 6-24 HRS): SARS Coronavirus 2: NEGATIVE

## 2021-01-30 NOTE — Progress Notes (Signed)
Subjective:  Patient ID: Barbara Marks, female    DOB: 1934-04-13,  MRN: [id number]  Chief Complaint  Patient presents with  . Toe Pain    Patient presents today for continued right 5th toe pain.  She says it feels like something is between her toes and her whole toe hurts and aches.  Recently it has been keeping her awake at night    87 y.o. female presents with the above complaint.  Patient presents with complaint of right fifth digit pain on the lateral side.  Patient states it hurts with ambulation.  She states that she gets worse with ambulation.  She states that she wear soft shoes.  She denies any other acute complaints is been recently keeping her awake at night.  She denies any other issues.  She has not tried anything for it.  She has not tried any kind of toe protectors.   Review of Systems: Negative except as noted in the HPI. Denies N/V/F/Ch.  Past Medical History:  Diagnosis Date  . Anxiety   . CAD (coronary artery disease) Non-obstructive   a. 2004 Cath: nonobs dzs.  . DDD (degenerative disc disease)    a. with chronic right sided back pain - improves after seeing chiropractor.  . Depression    a. h/o ECT  . Diverticulitis   . Glaucoma   . HTN (hypertension)   . Hyperlipidemia, mixed   . Melanoma of skin ([place]) 2008   resected from Left arm   . Memory problem    "states memory issues"  . Nephrolithiasis   . Pancreatic cyst    a. 10932 Endoscopic U/S: nl UGI tract, 1-51mm pancreatic cysts, no masses/nodules.  . Premature ventricular contraction    a. managed with propafenone.  . Presence of permanent cardiac pacemaker   . Symptomatic bradycardia    a. s/p MDT PPM in 08/2005;  b. 02/2010 Gen change->MDT Adapta DC PPM, ser # [id number].    Current Outpatient Medications:  .  amLODipine (NORVASC) 5 MG tablet, Take 1 tablet (5 mg total) by mouth every morning., Disp: 90 tablet, Rfl: 3 .  B Complex Vitamins (B COMPLEX PO), Take by mouth daily., Disp: , Rfl:  .   COLLAGEN PO, Take by mouth. 1 scoop powder daily, Disp: , Rfl:  .  MAGNESIUM PO, Take by mouth daily., Disp: , Rfl:  .  Multiple Vitamin (MULTIVITAMIN) tablet, Take 1 tablet by mouth daily.  , Disp: , Rfl:  .  Multiple Vitamins-Minerals (PRESERVISION AREDS) TABS, Take 1 tablet by mouth in the morning and at bedtime. , Disp: , Rfl:  .  naproxen sodium (ALEVE) 220 MG tablet, Take 220 mg by mouth daily as needed., Disp: , Rfl:  .  Polyethylene Glycol 3350 (MIRALAX PO), Take by mouth daily as needed., Disp: , Rfl:  .  potassium chloride (MICRO-K) 10 MEQ CR capsule, TAKE 1 CAPSULE(10 MEQ) BY MOUTH DAILY, Disp: 90 capsule, Rfl: 1 .  Red Yeast Rice 600 MG CAPS, 4 capsules daily, Disp: , Rfl:  .  TURMERIC PO, Take by mouth daily., Disp: , Rfl:   Social History   Tobacco Use  Smoking Status Never Smoker  Smokeless Tobacco Never Used    Allergies  Allergen Reactions  . Codeine     unknown  . Influenza Vaccines Cough, Hypertension and Nausea And Vomiting  . Simvastatin Other (See Comments)    "leg cramps"   Objective:  There were no vitals filed for this visit. There is no  height or weight on file to calculate BMI. Constitutional Well developed. Well nourished.  Vascular Dorsalis pedis pulses palpable bilaterally. Posterior tibial pulses palpable bilaterally. Capillary refill normal to all digits.  No cyanosis or clubbing noted. Pedal hair growth normal.  Neurologic Normal speech. Oriented to person, place, and time. Epicritic sensation to light touch grossly present bilaterally.  Dermatologic  right lateral fifth digit hyperkeratotic lesion with adductovarus of the hammertoe.  Hammertoe contracture noted of the fifth digit.  Orthopedic: Normal joint ROM without pain or crepitus bilaterally. No visible deformities. No bony tenderness.   Radiographs: None Assessment:   1. Hammertoe of right foot    Plan:  Patient was evaluated and treated and all questions answered.  Right  fifth digit hammertoe with adductovarus deformity -I explained to patient the etiology of hammertoe contracture and various treatment options were discussed.  It seems like this is abutting against the fourth toe leading to pressure as well as pain on the L side from the shoes.  I believe patient will benefit from a toe protector she agrees with the plan. -Toe protectors were dispensed -If there is no resolve meant of pain will discuss surgical options.  She states understanding  No follow-ups on file.

## 2021-01-30 NOTE — Telephone Encounter (Signed)
Patient has some questions regarding care after leaving her office visit. She is requesting a call back. Please call back at your earliest convenience.

## 2021-01-31 ENCOUNTER — Other Ambulatory Visit: Payer: Self-pay | Admitting: Psychiatry

## 2021-02-02 ENCOUNTER — Other Ambulatory Visit: Payer: Self-pay | Admitting: Psychiatry

## 2021-02-03 ENCOUNTER — Other Ambulatory Visit: Payer: Self-pay

## 2021-02-03 ENCOUNTER — Ambulatory Visit
Admission: RE | Admit: 2021-02-03 | Discharge: 2021-02-03 | Disposition: A | Discharge status: Home / Self Care | Payer: PPO | Source: Ambulatory Visit | Attending: Psychiatry | Admitting: Psychiatry

## 2021-02-03 ENCOUNTER — Encounter: Payer: Self-pay | Admitting: Anesthesiology

## 2021-02-03 DIAGNOSIS — I10 Essential (primary) hypertension: Secondary | ICD-10-CM | POA: Diagnosis not present

## 2021-02-03 DIAGNOSIS — F419 Anxiety disorder, unspecified: Secondary | ICD-10-CM | POA: Insufficient documentation

## 2021-02-03 DIAGNOSIS — F332 Major depressive disorder, recurrent severe without psychotic features: Secondary | ICD-10-CM | POA: Diagnosis not present

## 2021-02-03 DIAGNOSIS — Z950 Presence of cardiac pacemaker: Secondary | ICD-10-CM | POA: Diagnosis not present

## 2021-02-03 DIAGNOSIS — F3289 Other specified depressive episodes: Secondary | ICD-10-CM | POA: Insufficient documentation

## 2021-02-03 DIAGNOSIS — Z9049 Acquired absence of other specified parts of digestive tract: Secondary | ICD-10-CM | POA: Diagnosis not present

## 2021-02-03 DIAGNOSIS — Z887 Allergy status to serum and vaccine status: Secondary | ICD-10-CM | POA: Insufficient documentation

## 2021-02-03 DIAGNOSIS — F32A Depression, unspecified: Secondary | ICD-10-CM | POA: Diagnosis not present

## 2021-02-03 DIAGNOSIS — Z885 Allergy status to narcotic agent status: Secondary | ICD-10-CM | POA: Diagnosis not present

## 2021-02-03 DIAGNOSIS — Z888 Allergy status to other drugs, medicaments and biological substances status: Secondary | ICD-10-CM | POA: Insufficient documentation

## 2021-02-03 DIAGNOSIS — E782 Mixed hyperlipidemia: Secondary | ICD-10-CM | POA: Insufficient documentation

## 2021-02-03 DIAGNOSIS — I2510 Atherosclerotic heart disease of native coronary artery without angina pectoris: Secondary | ICD-10-CM | POA: Diagnosis not present

## 2021-02-03 DIAGNOSIS — Z85820 Personal history of malignant melanoma of skin: Secondary | ICD-10-CM | POA: Insufficient documentation

## 2021-02-03 DIAGNOSIS — Z8051 Family history of malignant neoplasm of kidney: Secondary | ICD-10-CM | POA: Diagnosis not present

## 2021-02-03 DIAGNOSIS — R630 Anorexia: Secondary | ICD-10-CM | POA: Diagnosis not present

## 2021-02-03 MED ORDER — SUCCINYLCHOLINE CHLORIDE 20 MG/ML IJ SOLN
INTRAMUSCULAR | Status: DC | PRN
  Administered 2021-02-03: 90 mg via INTRAVENOUS

## 2021-02-03 MED ORDER — KETAMINE HCL 10 MG/ML IJ SOLN
INTRAMUSCULAR | Status: DC | PRN
  Administered 2021-02-03: 50 mg via INTRAVENOUS

## 2021-02-03 MED ORDER — KETAMINE HCL 50 MG/5ML IJ SOSY
PREFILLED_SYRINGE | INTRAMUSCULAR | Status: AC
  Filled 2021-02-03: qty 5

## 2021-02-03 MED ORDER — SODIUM CHLORIDE 0.9 % IV SOLN
500.0000 mL | Freq: Once | INTRAVENOUS | Status: AC
  Administered 2021-02-03: 500 mL via INTRAVENOUS

## 2021-02-03 MED ORDER — KETOROLAC TROMETHAMINE 30 MG/ML IJ SOLN
INTRAMUSCULAR | Status: AC
  Administered 2021-02-03: 15 mg via INTRAVENOUS
  Filled 2021-02-03: qty 1

## 2021-02-03 MED ORDER — KETOROLAC TROMETHAMINE 30 MG/ML IJ SOLN: 15.0000 mg | Freq: Once | INTRAMUSCULAR | Status: AC

## 2021-02-03 MED ORDER — PROPOFOL 10 MG/ML IV BOLUS
INTRAVENOUS | Status: DC | PRN
  Administered 2021-02-03: 20 mg via INTRAVENOUS

## 2021-02-03 MED ORDER — SODIUM CHLORIDE 0.9 % IV SOLN: INTRAVENOUS | Status: DC | PRN

## 2021-02-03 MED ORDER — PROPOFOL 10 MG/ML IV BOLUS
INTRAVENOUS | Status: AC
  Filled 2021-02-03: qty 20

## 2021-02-03 MED ORDER — SUCCINYLCHOLINE CHLORIDE 200 MG/10ML IV SOSY
PREFILLED_SYRINGE | INTRAVENOUS | Status: AC
  Filled 2021-02-03: qty 10

## 2021-02-03 NOTE — Anesthesia Postprocedure Evaluation (Signed)
Anesthesia Post Note  Patient: Barbara Marks  Procedure(s) Performed: ECT TX  Patient location during evaluation: PACU Anesthesia Type: General Level of consciousness: awake and alert Pain management: pain level controlled Vital Signs Assessment: post-procedure vital signs reviewed and stable Respiratory status: spontaneous breathing, nonlabored ventilation, respiratory function stable and patient connected to nasal cannula oxygen Cardiovascular status: blood pressure returned to baseline and stable Postop Assessment: no apparent nausea or vomiting Anesthetic complications: no   No complications documented.   Last Vitals:  Vitals:   02/03/21 1410 02/03/21 1413  BP: (!) 141/80   Pulse: 89 90  Resp: (!) 21 19  Temp:  36.7 C  SpO2: 96%     Last Pain:  Vitals:   02/03/21 1413  TempSrc: Temporal  PainSc: 0-No pain                 Precious Haws Donevan Biller

## 2021-02-03 NOTE — Transfer of Care (Signed)
Immediate Anesthesia Transfer of Care Note  Patient: Barbara Marks  Procedure(s) Performed: ECT TX  Patient Location: PACU  Anesthesia Type:General  Level of Consciousness: awake  Airway & Oxygen Therapy: Patient connected to face mask oxygen  Post-op Assessment: Post -op Vital signs reviewed and stable  Post vital signs: stable  Last Vitals:  Vitals Value Taken Time  BP 154/63 02/03/21 1349  Temp    Pulse 40 02/03/21 1350  Resp 17 02/03/21 1350  SpO2 100 % 02/03/21 1350  Vitals shown include unvalidated device data.  Last Pain:  Vitals:   02/03/21 1154  TempSrc:   PainSc: 0-No pain         Complications: No complications documented.

## 2021-02-03 NOTE — Anesthesia Procedure Notes (Signed)
Performed by: Aline Brochure, CRNA Pre-anesthesia Checklist: Patient identified, Emergency Drugs available, Suction available and Patient being monitored Patient Re-evaluated:Patient Re-evaluated prior to induction Oxygen Delivery Method: Circle system utilized Preoxygenation: Pre-oxygenation with 100% oxygen Induction Type: IV induction Ventilation: Mask ventilation without difficulty and Mask ventilation throughout procedure Airway Equipment and Method: Bite block Placement Confirmation: positive ETCO2 Dental Injury: Teeth and Oropharynx as per pre-operative assessment

## 2021-02-03 NOTE — Procedures (Signed)
ECT SERVICES Physician's Interval Evaluation & Treatment Note  Patient Identification: Barbara Marks MRN:  [id number] Date of Evaluation:  02/03/2021 TX #: [id number]  MADRS:   MMSE:   P.E. Findings:  No change  Psychiatric Interval Note:  Increased anxiety  Subjective:  Patient is a 87 y.o. female seen for evaluation for Electroconvulsive Therapy. Anxious and decreased appetite  Treatment Summary:   []   Right Unilateral             []  Bilateral   % Energy : 1.0 ms 75%   Impedance: 2150 ohms  Seizure Energy Index: 38208 mv2  Postictal Suppression Index:    Seizure Concordance Index: 45%  Medications  Pre Shock: torodol 15mg , brevital 80mg , succunylcholine 90mg   Post Shock:    Seizure Duration: 35s emg, 59s eeg   Comments: Treatment today and then follow-up as needed as she prefers  Lungs:  [x]   Clear to auscultation               []  Other:   Heart:    [x]   Regular rhythm             []  irregular rhythm    [x]   Previous H&P reviewed, patient examined and there are NO CHANGES                 []   Previous H&P reviewed, patient examined and there are changes noted.   [name], MD 5/2/20227:53 PM

## 2021-02-03 NOTE — H&P (Signed)
Barbara Marks is an 87 y.o. female.   Chief Complaint: Patient complains of recent loss of appetite and decreased interest in activities and anxiety HPI: History of longstanding depression with good response to ECT  Past Medical History:  Diagnosis Date  . Anxiety   . CAD (coronary artery disease) Non-obstructive   a. 2004 Cath: nonobs dzs.  . DDD (degenerative disc disease)    a. with chronic right sided back pain - improves after seeing chiropractor.  . Depression    a. h/o ECT  . Diverticulitis   . Glaucoma   . HTN (hypertension)   . Hyperlipidemia, mixed   . Melanoma of skin ([place]) 2008   resected from Left arm   . Memory problem    "states memory issues"  . Nephrolithiasis   . Pancreatic cyst    a. 40102 Endoscopic U/S: nl UGI tract, 1-70mm pancreatic cysts, no masses/nodules.  . Premature ventricular contraction    a. managed with propafenone.  . Presence of permanent cardiac pacemaker   . Symptomatic bradycardia    a. s/p MDT PPM in 08/2005;  b. 02/2010 Gen change->MDT Adapta DC PPM, ser # [id number].    Past Surgical History:  Procedure Laterality Date  . APPENDECTOMY  1952  . CATARACT EXTRACTION, BILATERAL    . CHOLECYSTECTOMY  1983  . EUS N/A 06/22/2013   Procedure: UPPER ENDOSCOPIC ULTRASOUND (EUS) LINEAR;  Surgeon: Milus Banister, MD;  Location: [facility];  Service: Endoscopy;  Laterality: N/A;  . EYE SURGERY Bilateral    Cataract Extraction with IOL  . PACEMAKER INSERTION Left    [employer]  . TUMOR EXCISION     And nemamgeomas    Family History  Problem Relation Age of Onset  . Other Mother        died @ 34 - old age.  . Stroke Father        cva after cea in his 67's, died @ 26.  . Kidney cancer Brother    Social History:  reports that she has never smoked. She has never used smokeless tobacco. She reports that she does not drink alcohol and does not use drugs.  Allergies:  Allergies  Allergen Reactions  . Codeine     unknown  . Influenza  Vaccines Cough, Hypertension and Nausea And Vomiting  . Simvastatin Other (See Comments)    "leg cramps"    (Not in a hospital admission)   No results found for this or any previous visit (from the past 48 hour(s)). No results found.  Review of Systems  Constitutional: Positive for appetite change.  HENT: Negative.   Eyes: Negative.   Respiratory: Negative.   Cardiovascular: Negative.   Gastrointestinal: Negative.   Musculoskeletal: Negative.   Skin: Negative.   Neurological: Negative.   Psychiatric/Behavioral: Positive for dysphoric mood.    Blood pressure (!) 141/80, pulse 90, temperature 98 F (36.7 C), temperature source Temporal, resp. rate 19, height 5\' 6"  (1.676 m), weight 67.6 kg, SpO2 96 %. Physical Exam Vitals and nursing note reviewed.  Constitutional:      Appearance: She is well-developed.  HENT:     Head: Normocephalic and atraumatic.  Eyes:     Conjunctiva/sclera: Conjunctivae normal.     Pupils: Pupils are equal, round, and reactive to light.  Cardiovascular:     Heart sounds: Normal heart sounds.  Pulmonary:     Effort: Pulmonary effort is normal.  Abdominal:     Palpations: Abdomen is soft.  Musculoskeletal:  General: Normal range of motion.     Cervical back: Normal range of motion.  Skin:    General: Skin is warm and dry.  Neurological:     Mental Status: She is alert.  Psychiatric:        Attention and Perception: Attention normal.        Mood and Affect: Affect is blunt.        Speech: Speech is delayed.        Behavior: Behavior is slowed.        Thought Content: Thought content does not include homicidal or suicidal ideation.        Cognition and Memory: Memory is impaired.        Judgment: Judgment normal.      Assessment/Plan ECT treatment today.  Patient strongly prefers to only come in as needed and has been stable this way recently.  After treatment today we will not schedule another one immediately  [name],  MD 02/03/2021, 7:51 PM

## 2021-02-03 NOTE — Anesthesia Preprocedure Evaluation (Signed)
Anesthesia Evaluation  Patient identified by MRN, date of birth, ID band Patient awake    Reviewed: Allergy & Precautions, NPO status , Patient's Chart, lab work & pertinent test results  History of Anesthesia Complications Negative for: history of anesthetic complications  Airway Mallampati: II  TM Distance: >3 FB Neck ROM: Full    Dental  (+) Poor Dentition, Chipped   Pulmonary neg pulmonary ROS, neg sleep apnea, neg COPD,    breath sounds clear to auscultation- rhonchi (-) wheezing      Cardiovascular hypertension, + CAD  (-) Cardiac Stents and (-) CABG + dysrhythmias + pacemaker  Rhythm:Regular Rate:Normal - Systolic murmurs and - Diastolic murmurs    Neuro/Psych neg Seizures PSYCHIATRIC DISORDERS Anxiety Depression negative neurological ROS     GI/Hepatic negative GI ROS, Neg liver ROS,   Endo/Other  negative endocrine ROSneg diabetes  Renal/GU Renal disease (hx of nephrolithiasis)     Musculoskeletal  (+) Arthritis ,   Abdominal (+) - obese,   Peds  Hematology  (+) Blood dyscrasia, anemia ,   Anesthesia Other Findings Past Medical History: No date: Anxiety Non-obstructive: CAD (coronary artery disease)     Comment:  a. 2004 Cath: nonobs dzs. No date: DDD (degenerative disc disease)     Comment:  a. with chronic right sided back pain - improves after               seeing chiropractor. No date: Depression     Comment:  a. h/o ECT No date: Diverticulitis No date: Glaucoma No date: HTN (hypertension) No date: Hyperlipidemia, mixed 2008: Melanoma of skin ([facility])     Comment:  resected from Left arm  No date: Memory problem     Comment:  "states memory issues" No date: Nephrolithiasis No date: Pancreatic cyst     Comment:  a. 92014 Endoscopic U/S: nl UGI tract, 1-2mm pancreatic               cysts, no masses/nodules. No date: Premature ventricular contraction     Comment:  a. managed with  propafenone. No date: Presence of permanent cardiac pacemaker No date: Symptomatic bradycardia     Comment:  a. s/p MDT PPM in 08/2005;  b. 02/2010 Gen change->MDT               Adapta DC PPM, ser # [id number].   Reproductive/Obstetrics                             Anesthesia Physical  Anesthesia Plan  ASA: III  Anesthesia Plan: General   Post-op Pain Management:    Induction: Intravenous  PONV Risk Score and Plan: 2 and Ondansetron  Airway Management Planned: Mask  Additional Equipment:   Intra-op Plan:   Post-operative Plan:   Informed Consent: I have reviewed the patients History and Physical, chart, labs and discussed the procedure including the risks, benefits and alternatives for the proposed anesthesia with the patient or authorized representative who has indicated his/her understanding and acceptance.     Dental advisory given  Plan Discussed with: CRNA and Anesthesiologist  Anesthesia Plan Comments: (Patient consented for risks of anesthesia including but not limited to:  - adverse reactions to medications - risk of intubation if required - damage to eyes, teeth, lips or other oral mucosa - nerve damage due to positioning  - sore throat or hoarseness - Damage to heart, brain, nerves, lungs, other parts of body or loss of   life  Patient voiced understanding.)        Anesthesia Quick Evaluation

## 2021-02-08 ENCOUNTER — Other Ambulatory Visit: Payer: Self-pay | Admitting: Psychiatry

## 2021-02-12 DIAGNOSIS — R1030 Lower abdominal pain, unspecified: Secondary | ICD-10-CM | POA: Insufficient documentation

## 2021-02-12 NOTE — Assessment & Plan Note (Addendum)
Not clearly related to fall  Or back injury.   More concerning for possible diverticulitis.  Tylenol as needed for pain.  Avoid aleve.  We will move forward with setting up CT scan of abdomen to rule out diverticulitis. Eval with labs.

## 2021-02-13 ENCOUNTER — Other Ambulatory Visit: Payer: Self-pay | Admitting: Psychiatry

## 2021-02-18 ENCOUNTER — Other Ambulatory Visit: Payer: Self-pay | Admitting: Psychiatry

## 2021-02-18 MED ORDER — MIRTAZAPINE 15 MG PO TABS: 15.0000 mg | ORAL_TABLET | Freq: Every evening | ORAL | 4 refills | Status: DC | PRN

## 2021-03-18 DIAGNOSIS — M4854XD Collapsed vertebra, not elsewhere classified, thoracic region, subsequent encounter for fracture with routine healing: Secondary | ICD-10-CM | POA: Diagnosis not present

## 2021-03-18 DIAGNOSIS — M47896 Other spondylosis, lumbar region: Secondary | ICD-10-CM | POA: Diagnosis not present

## 2021-03-18 DIAGNOSIS — M545 Low back pain, unspecified: Secondary | ICD-10-CM | POA: Diagnosis not present

## 2021-03-19 ENCOUNTER — Other Ambulatory Visit (HOSPITAL_COMMUNITY): Payer: Self-pay | Admitting: Physician Assistant

## 2021-03-19 ENCOUNTER — Other Ambulatory Visit: Payer: Self-pay | Admitting: Physician Assistant

## 2021-03-19 DIAGNOSIS — M545 Low back pain, unspecified: Secondary | ICD-10-CM

## 2021-03-20 ENCOUNTER — Telehealth: Payer: Self-pay | Admitting: Cardiology

## 2021-03-20 NOTE — Telephone Encounter (Signed)
To [facility] to review.

## 2021-03-20 NOTE — Telephone Encounter (Signed)
[facility] Is wanting patient to have an MRI. Patient has been advised by [facility] that they are unable to preform the MRI due to her pacemaker. Patient reports her pacemaker has been disconnected for years now. Patient is wanting to discuss with the office what her options would be.

## 2021-03-21 ENCOUNTER — Other Ambulatory Visit: Payer: Self-pay | Admitting: Physician Assistant

## 2021-03-21 DIAGNOSIS — M545 Low back pain, unspecified: Secondary | ICD-10-CM

## 2021-03-21 DIAGNOSIS — M4854XD Collapsed vertebra, not elsewhere classified, thoracic region, subsequent encounter for fracture with routine healing: Secondary | ICD-10-CM

## 2021-03-24 NOTE — Telephone Encounter (Signed)
Poke with pt, advised device is not MRI compatible.  If she wants PPM removed will need to schedule appt with MD/ obtain insurance approval for procedure.    She states she is scheduled for a CT scan and will divide if further action is needed at a later time.

## 2021-04-02 ENCOUNTER — Ambulatory Visit
Admission: RE | Admit: 2021-04-02 | Discharge: 2021-04-02 | Disposition: A | Discharge status: Home / Self Care | Payer: PPO | Source: Ambulatory Visit | Attending: Physician Assistant | Admitting: Physician Assistant

## 2021-04-02 ENCOUNTER — Other Ambulatory Visit: Payer: Self-pay

## 2021-04-02 DIAGNOSIS — M545 Low back pain, unspecified: Secondary | ICD-10-CM | POA: Diagnosis not present

## 2021-04-02 DIAGNOSIS — M546 Pain in thoracic spine: Secondary | ICD-10-CM | POA: Diagnosis not present

## 2021-04-02 DIAGNOSIS — M4854XD Collapsed vertebra, not elsewhere classified, thoracic region, subsequent encounter for fracture with routine healing: Secondary | ICD-10-CM | POA: Insufficient documentation

## 2021-04-11 DIAGNOSIS — M545 Low back pain, unspecified: Secondary | ICD-10-CM | POA: Diagnosis not present

## 2021-04-30 DIAGNOSIS — M545 Low back pain, unspecified: Secondary | ICD-10-CM | POA: Diagnosis not present

## 2021-04-30 DIAGNOSIS — M4856XA Collapsed vertebra, not elsewhere classified, lumbar region, initial encounter for fracture: Secondary | ICD-10-CM | POA: Diagnosis not present

## 2021-04-30 DIAGNOSIS — G894 Chronic pain syndrome: Secondary | ICD-10-CM | POA: Diagnosis not present

## 2021-05-19 ENCOUNTER — Other Ambulatory Visit: Payer: Self-pay

## 2021-05-19 ENCOUNTER — Ambulatory Visit: Payer: PPO | Admitting: Dermatology

## 2021-05-19 DIAGNOSIS — L720 Epidermal cyst: Secondary | ICD-10-CM | POA: Diagnosis not present

## 2021-05-19 MED ORDER — MUPIROCIN 2 % EX OINT: 1.0000 "application " | TOPICAL_OINTMENT | Freq: Every day | CUTANEOUS | 1 refills | Status: DC

## 2021-05-19 MED ORDER — DOXYCYCLINE MONOHYDRATE 100 MG PO CAPS: 100.0000 mg | ORAL_CAPSULE | Freq: Two times a day (BID) | ORAL | 0 refills | Status: DC

## 2021-05-19 NOTE — Patient Instructions (Signed)

## 2021-05-19 NOTE — Progress Notes (Signed)
   Follow-Up Visit   Subjective  Barbara Marks is a 87 y.o. female who presents for the following: open sore (L inframammary, >1 wk, painful prn). Has drained pus, but seems to be doing better now.  The following portions of the chart were reviewed this encounter and updated as appropriate:       Review of Systems:  No other skin or systemic complaints except as noted in HPI or Assessment and Plan.  Objective  Well appearing patient in no apparent distress; mood and affect are within normal limits.  A focused examination was performed including L inframammary. Relevant physical exam findings are noted in the Assessment and Plan.    Assessment & Plan  Epidermal cyst Left lat Inframammary  Inflamed  Start Doxycycline '100mg'$  1 po bid with food and drink for 2 weeks Start Mupirocin oint qd until healed  Recommend warm compress and massage qd  Doxycycline should be taken with food to prevent nausea. Do not lay down for 30 minutes after taking. Be cautious with sun exposure and use good sun protection while on this medication. Pregnant women should not take this medication.      Incision and Drainage - Left lat Inframammary Location: L lat inframammary  Informed Consent: Discussed risks (permanent scarring, light or dark discoloration, infection, pain, bleeding, bruising, redness, damage to adjacent structures, and recurrence of the lesion) and benefits of the procedure, as well as the alternatives.  Informed consent was obtained.  Preparation: The area was prepped with alcohol.  Anesthesia: no anesthesia  Procedure Details: No incision, opening overlying the lesion. The lesion drained pus and white, chalky cyst material and blood.  A small amount of fluid was drained.    Antibiotic ointment and a sterile pressure dressing were applied. The patient tolerated procedure well.  Total number of lesions drained: 1  Plan: The patient was instructed on post-op care. Recommend  OTC analgesia as needed for pain.   doxycycline (MONODOX) 100 MG capsule - Left lat Inframammary Take 1 capsule (100 mg total) by mouth 2 (two) times daily. Take with food and drink  mupirocin ointment (BACTROBAN) 2 % - Left lat Inframammary Apply 1 application topically daily. Qd to sore under left breast until healed  Return if symptoms worsen or fail to improve.  I, Othelia Pulling, RMA, am acting as scribe for Brendolyn Patty, MD .  Documentation: I have reviewed the above documentation for accuracy and completeness, and I agree with the above.  Brendolyn Patty MD

## 2021-06-25 ENCOUNTER — Other Ambulatory Visit: Payer: Self-pay | Admitting: Family Medicine

## 2021-06-25 DIAGNOSIS — E876 Hypokalemia: Secondary | ICD-10-CM

## 2021-06-26 DIAGNOSIS — H903 Sensorineural hearing loss, bilateral: Secondary | ICD-10-CM | POA: Diagnosis not present

## 2021-06-26 DIAGNOSIS — H6123 Impacted cerumen, bilateral: Secondary | ICD-10-CM | POA: Diagnosis not present

## 2021-07-16 ENCOUNTER — Other Ambulatory Visit: Payer: Self-pay | Admitting: Family Medicine

## 2021-08-11 ENCOUNTER — Other Ambulatory Visit: Payer: Self-pay | Admitting: Family Medicine

## 2021-08-11 NOTE — Telephone Encounter (Signed)
Please set up patient for nurse visit for AWV and then CPE with Dr Einar Pheasant in 2023 when she returns, thank you

## 2021-08-12 NOTE — Telephone Encounter (Signed)
Called Ms. Naphtali and left VM

## 2021-08-13 NOTE — Telephone Encounter (Signed)
2nd attempt  Call went straight to VM... LMTCB to schedule appts

## 2021-09-12 ENCOUNTER — Encounter: Payer: Self-pay | Admitting: Cardiology

## 2021-09-12 ENCOUNTER — Other Ambulatory Visit: Payer: Self-pay

## 2021-09-12 ENCOUNTER — Ambulatory Visit: Payer: PPO | Admitting: Cardiology

## 2021-09-12 VITALS — BP 118/76 | HR 83 | Ht 66.0 in | Wt 142.4 lb

## 2021-09-12 DIAGNOSIS — I493 Ventricular premature depolarization: Secondary | ICD-10-CM | POA: Diagnosis not present

## 2021-09-12 DIAGNOSIS — I10 Essential (primary) hypertension: Secondary | ICD-10-CM | POA: Diagnosis not present

## 2021-09-12 DIAGNOSIS — I351 Nonrheumatic aortic (valve) insufficiency: Secondary | ICD-10-CM

## 2021-09-12 NOTE — Progress Notes (Signed)
Cardiology Office Note:    Date:  09/12/2021   ID:  [name], DOB Feb 22, 1934, MRN [id number]  PCP:  Lesleigh Noe, MD  [facility] Cardiologist:  Kate Sable, MD  [facility] Electrophysiologist:  None   Referring MD: Lesleigh Noe, MD   Chief Complaint  Patient presents with   Follow-up    Yearly follow up      History of Present Illness:    Barbara Marks is a 87 y.o. female with a hx of hypertension, hyperlipidemia, symptomatic bradycardia status post permanent pacemaker 2006, GEN change 2011 depression, anxiety who presents for follow-up.    Patient is being seen for frequent PVCs, mildly reduced EF, mild aortic regurgitation.  Rate control agent not used due to history of bradycardia.  She feels well, denies dizziness, syncope, chest pain.  Blood pressure is adequately controlled.  Has no concerns at this time.  Prior notes Patient saw sports medicine physician on 08/12/2020 due to worsening left ankle swelling which is new.  Swelling ongoing for the past several weeks.  Swelling has gotten better over the past 3 days.  Swelling stays about the same throughout the day.  Echocardiogram 2014 with normal EF, 50-55, impaired relaxation.[name]  History of myalgias with statins she was in the [place] previously for about 20 years.    She states her pacemaker was placed due to recurrent PVCs.  [place] the [place], it was discovered she never needed a pacemaker due to PVCs.  Her pacemaker was "turned off".  Has not followed up in the clinic since 2018.  Past Medical History:  Diagnosis Date   Anxiety    CAD (coronary artery disease) Non-obstructive   a. 2004 Cath: nonobs dzs.   DDD (degenerative disc disease)    a. with chronic right sided back pain - improves after seeing chiropractor.   Depression    a. h/o ECT   Diverticulitis    Glaucoma    HTN (hypertension)    Hyperlipidemia, mixed    Melanoma of skin ([place]) 2008   resected from  Left arm    Memory problem    "states memory issues"   Nephrolithiasis    Pancreatic cyst    a. 02542 Endoscopic U/S: nl UGI tract, 1-2mm pancreatic cysts, no masses/nodules.   Premature ventricular contraction    a. managed with propafenone.   Presence of permanent cardiac pacemaker    Symptomatic bradycardia    a. s/p MDT PPM in 08/2005;  b. 02/2010 Gen change->MDT [place] DC PPM, ser # [id number].    Past Surgical History:  Procedure Laterality Date   APPENDECTOMY  1952   CATARACT EXTRACTION, BILATERAL     CHOLECYSTECTOMY  1983   EUS N/A 06/22/2013   Procedure: UPPER ENDOSCOPIC ULTRASOUND (EUS) LINEAR;  Surgeon: Milus Banister, MD;  Location: [facility];  Service: Endoscopy;  Laterality: N/A;   EYE SURGERY Bilateral    Cataract Extraction with IOL   PACEMAKER INSERTION Left    [employer]   TUMOR EXCISION     And nemamgeomas    Current Medications: Current Meds  Medication Sig   amLODipine (NORVASC) 5 MG tablet TAKE 1 TABLET BY MOUTH EVERY MORNING   B Complex Vitamins (B COMPLEX PO) Take by mouth daily.   COLLAGEN PO Take by mouth. 1 scoop powder daily   diclofenac Sodium (VOLTAREN) 1 % GEL Apply 2 g topically 4 (four) times daily.   doxycycline (MONODOX) 100 MG capsule Take  1 capsule (100 mg total) by mouth 2 (two) times daily. Take with food and drink   MAGNESIUM PO Take by mouth daily.   mirtazapine (REMERON) 15 MG tablet TAKE 1 TABLET BY MOUTH AT BEDTIME   mirtazapine (REMERON) 15 MG tablet Take 1 tablet (15 mg total) by mouth at bedtime as needed (sleep).   Multiple Vitamin (MULTIVITAMIN) tablet Take 1 tablet by mouth daily.     Multiple Vitamins-Minerals (PRESERVISION AREDS) TABS Take 1 tablet by mouth in the morning and at bedtime.    naproxen sodium (ALEVE) 220 MG tablet Take 220 mg by mouth daily as needed.   Polyethylene Glycol 3350 (MIRALAX PO) Take by mouth daily as needed.   potassium chloride (MICRO-K) 10 MEQ CR capsule TAKE 1 CAPSULE(10 MEQ) BY MOUTH DAILY    Red Yeast Rice 600 MG CAPS 4 capsules daily     Allergies:   Codeine, Influenza vaccines, and Simvastatin   Social History   Socioeconomic History   Marital status: Widowed    Spouse name: Not on file   Number of children: 3   Years of education: some college   Highest education level: Not on file  Occupational History   Occupation: Retired    Fish farm manager: RETIRED  Tobacco Use   Smoking status: Never   Smokeless tobacco: Never  Vaping Use   Vaping Use: Never used  Substance and Sexual Activity   Alcohol use: No   Drug use: No   Sexual activity: Not Currently  Other Topics Concern   Not on file  Social History Narrative   Lives in [place]. Lives with a friend (also 45 year - Paul).    3 Kids, 7 grandchildren, 1 great-grandchild   Enjoys: movies on Friday with friends, walks   Exercise: walking around the house for exercise   Diet: likes everything   Social Determinants of Health   Financial Resource Strain: Not on file  Food Insecurity: Not on file  Transportation Needs: Not on file  Physical Activity: Not on file  Stress: Not on file  Social Connections: Not on file     Family History: The patient's family history includes Kidney cancer in her brother; Other in her mother; Stroke in her father.  ROS:   Please see the history of present illness.     All other systems reviewed and are negative.  EKGs/Labs/Other Studies Reviewed:    The following studies were reviewed today:   EKG:  EKG is ordered today.  EKG shows sinus rhythm, frequent PVCs  Recent Labs: 09/14/2020: ALT 18 01/10/2021: BUN 18; Creatinine, Ser 0.79; Hemoglobin 13.0; Platelets 196.0; Potassium 4.1; Sodium 140  Recent Lipid Panel    Component Value Date/Time   CHOL 235 (H) 07/27/2019 0935   TRIG 79.0 07/27/2019 0935   HDL 63.00 07/27/2019 0935   CHOLHDL 4 07/27/2019 0935   VLDL 15.8 07/27/2019 0935   LDLCALC 156 (H) 07/27/2019 0935     Risk Assessment/Calculations:       Physical Exam:    VS:  BP 118/76   Pulse 83   Ht 5\' 6"  (1.676 m)   Wt 142 lb 6.4 oz (64.6 kg)   SpO2 98%   BMI 22.98 kg/m     Wt Readings from Last 3 Encounters:  09/12/21 142 lb 6.4 oz (64.6 kg)  01/10/21 154 lb 8 oz (70.1 kg)  09/18/20 156 lb 8 oz (71 kg)     GEN:  Well nourished, well developed in no acute distress HEENT:  Normal NECK: No JVD; No carotid bruits LYMPHATICS: No lymphadenopathy CARDIAC: RRR, 2/6 systolic murmur, frequent ectopy RESPIRATORY:  Clear to auscultation without rales, wheezing or rhonchi  ABDOMEN: Soft, non-tender, non-distended MUSCULOSKELETAL:  trace edema; varicose veins, mild lumbar discomfort with stretching SKIN: Warm and dry NEUROLOGIC:  Alert and oriented x 3 PSYCHIATRIC:  Normal affect   ASSESSMENT:    1. Frequent PVCs   2. Primary hypertension   3. Aortic valve insufficiency, etiology of cardiac valve disease unspecified     PLAN:    In order of problems listed above:  Patient with frequent PVCs, bigeminy.  Echo with mildly reduced EF.  Not significantly different from prior.  Aortic valve sclerosis, EF 45 to 50%.  History of bradycardia preventing use of beta-blockers or amiodarone for suppression.  Continue to monitor. Hypertension, BP controlled, continue amlodipine 5 mg daily Mild aortic valve regurgitation.  Plan repeat echocardiogram in 2 years(which will be 3 years from prior).  Follow-up after repeat echocardiogram.    Shared Decision Making/Informed Consent      Medication Adjustments/Labs and Tests Ordered: Current medicines are reviewed at length with the patient today.  Concerns regarding medicines are outlined above.  Orders Placed This Encounter  Procedures   EKG 12-Lead   ECHOCARDIOGRAM COMPLETE    No orders of the defined types were placed in this encounter.   Patient Instructions  Medication Instructions:  Your physician recommends that you continue on your current medications as directed.  Please refer to the Current Medication list given to you today.  *If you need a refill on your cardiac medications before your next appointment, please call your pharmacy*   Lab Work: None ordered If you have labs (blood work) drawn today and your tests are completely normal, you will receive your results only by: [place] (if you have MyChart) OR A paper copy in the mail If you have any lab test that is abnormal or we need to change your treatment, we will call you to review the results.   Testing/Procedures:  Your physician has requested that you have an echocardiogram in 2 years. Echocardiography is a painless test that uses sound waves to create images of your heart. It provides your doctor with information about the size and shape of your heart and how well your heart's chambers and valves are working. This procedure takes approximately one hour. There are no restrictions for this procedure.    Follow-Up: At [facility], you and your health needs are our priority.  As part of our continuing mission to provide you with exceptional heart care, we have created designated Provider Care Teams.  These Care Teams include your primary Cardiologist (physician) and Advanced Practice Providers (APPs -  Physician Assistants and Nurse Practitioners) who all work together to provide you with the care you need, when you need it.  We recommend signing up for the patient portal called "MyChart".  Sign up information is provided on this After Visit Summary.  MyChart is used to connect with patients for Virtual Visits (Telemedicine).  Patients are able to view lab/test results, encounter notes, upcoming appointments, etc.  Non-urgent messages can be sent to your provider as well.   To learn more about what you can do with MyChart, go to [email or web address].    Your next appointment:   2 year(s)  The format for your next appointment:   In Person  Provider:   You may see Kate Sable, MD or one of the following Advanced Practice  Providers on your designated Care Team:   Murray Hodgkins, NP Christell Faith, PA-C Cadence Kathlen Mody, [place]    Other Instructions    Signed, Kate Sable, MD  09/12/2021 12:35 PM    [place]

## 2021-09-12 NOTE — Patient Instructions (Signed)
Medication Instructions:  Your physician recommends that you continue on your current medications as directed. Please refer to the Current Medication list given to you today.  *If you need a refill on your cardiac medications before your next appointment, please call your pharmacy*   Lab Work: None ordered If you have labs (blood work) drawn today and your tests are completely normal, you will receive your results only by: [facility] (if you have MyChart) OR A paper copy in the mail If you have any lab test that is abnormal or we need to change your treatment, we will call you to review the results.   Testing/Procedures:  Your physician has requested that you have an echocardiogram in 2 years. Echocardiography is a painless test that uses sound waves to create images of your heart. It provides your doctor with information about the size and shape of your heart and how well your heart's chambers and valves are working. This procedure takes approximately one hour. There are no restrictions for this procedure.    Follow-Up: At [facility], you and your health needs are our priority.  As part of our continuing mission to provide you with exceptional heart care, we have created designated Provider Care Teams.  These Care Teams include your primary Cardiologist (physician) and Advanced Practice Providers (APPs -  Physician Assistants and Nurse Practitioners) who all work together to provide you with the care you need, when you need it.  We recommend signing up for the patient portal called "MyChart".  Sign up information is provided on this After Visit Summary.  MyChart is used to connect with patients for Virtual Visits (Telemedicine).  Patients are able to view lab/test results, encounter notes, upcoming appointments, etc.  Non-urgent messages can be sent to your provider as well.   To learn more about what you can do with MyChart, go to [email or web address].    Your next appointment:    2 year(s)  The format for your next appointment:   In Person  Provider:   You may see Kate Sable, MD or one of the following Advanced Practice Providers on your designated Care Team:   Murray Hodgkins, NP Christell Faith, PA-C Cadence Kathlen Mody, [place]    Other Instructions

## 2021-09-18 ENCOUNTER — Other Ambulatory Visit: Payer: Self-pay | Admitting: Family Medicine

## 2021-11-04 IMAGING — DX DG THORACIC SPINE 3V
3 series · 3 of 3 positions shown · non-contrast
Comparison: 11/19/2015

CLINICAL DATA: Upper back pain following fall, initial encounter

EXAM:
THORACIC SPINE - 3 VIEWS

[t-spine ap]
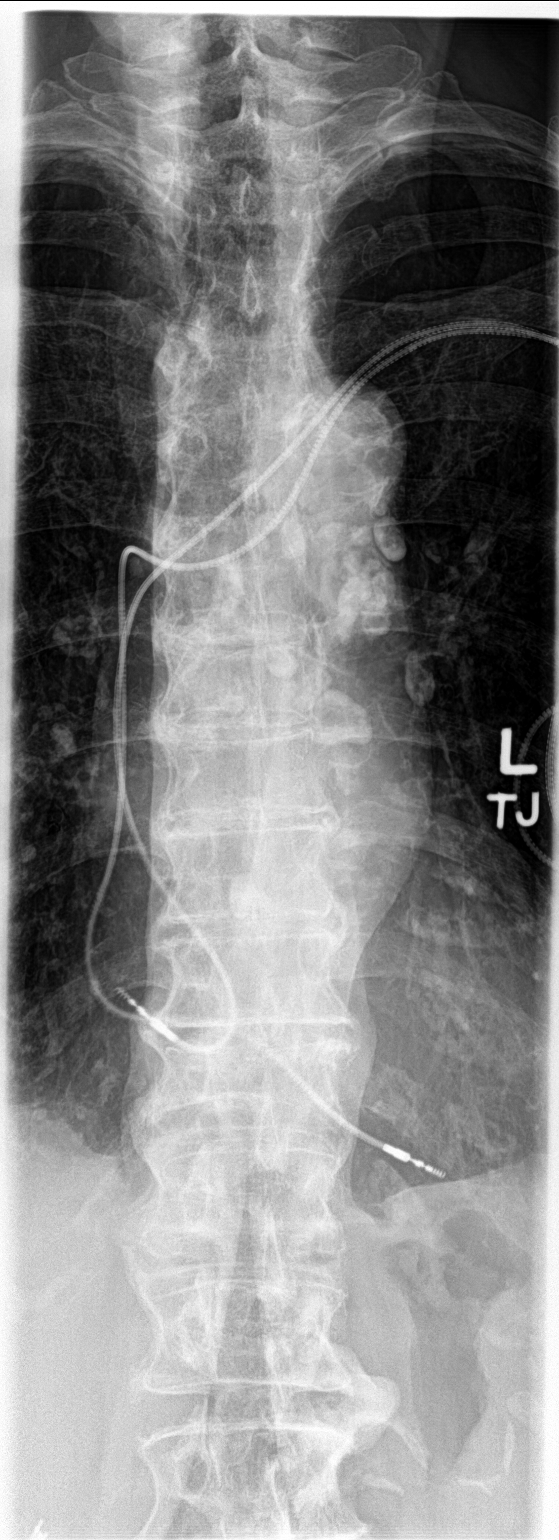

[t-spine lat]
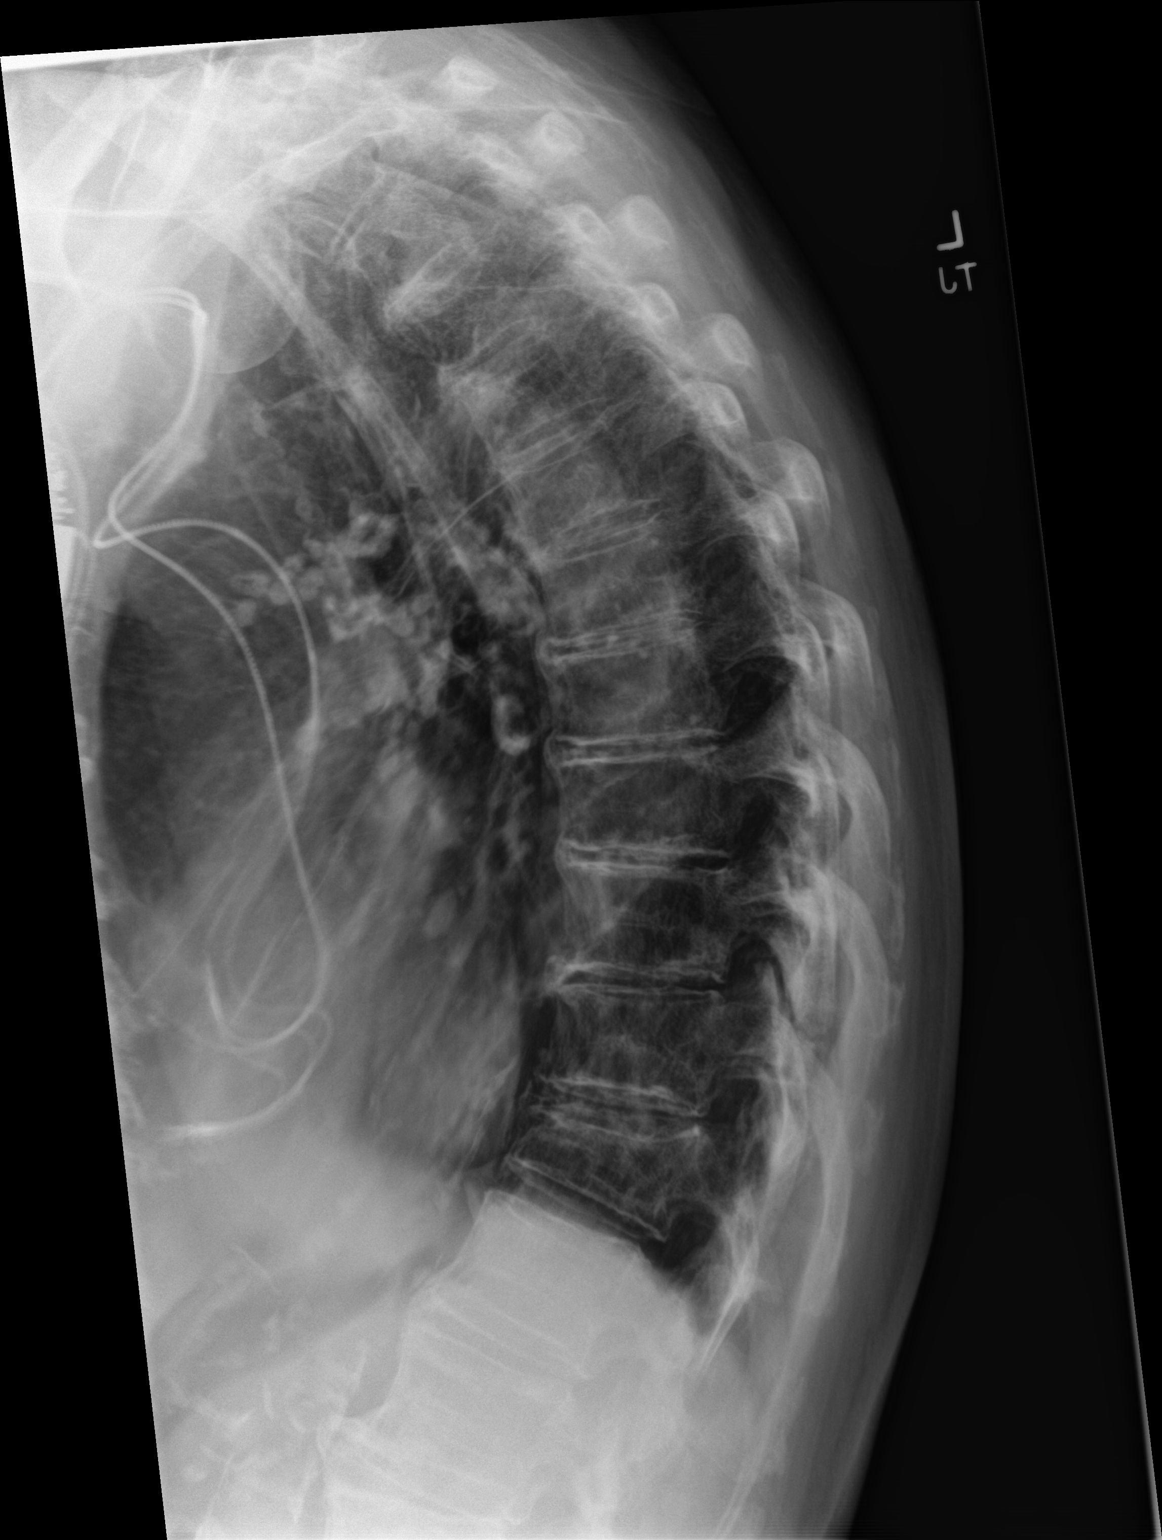

[t-spine swimmers]
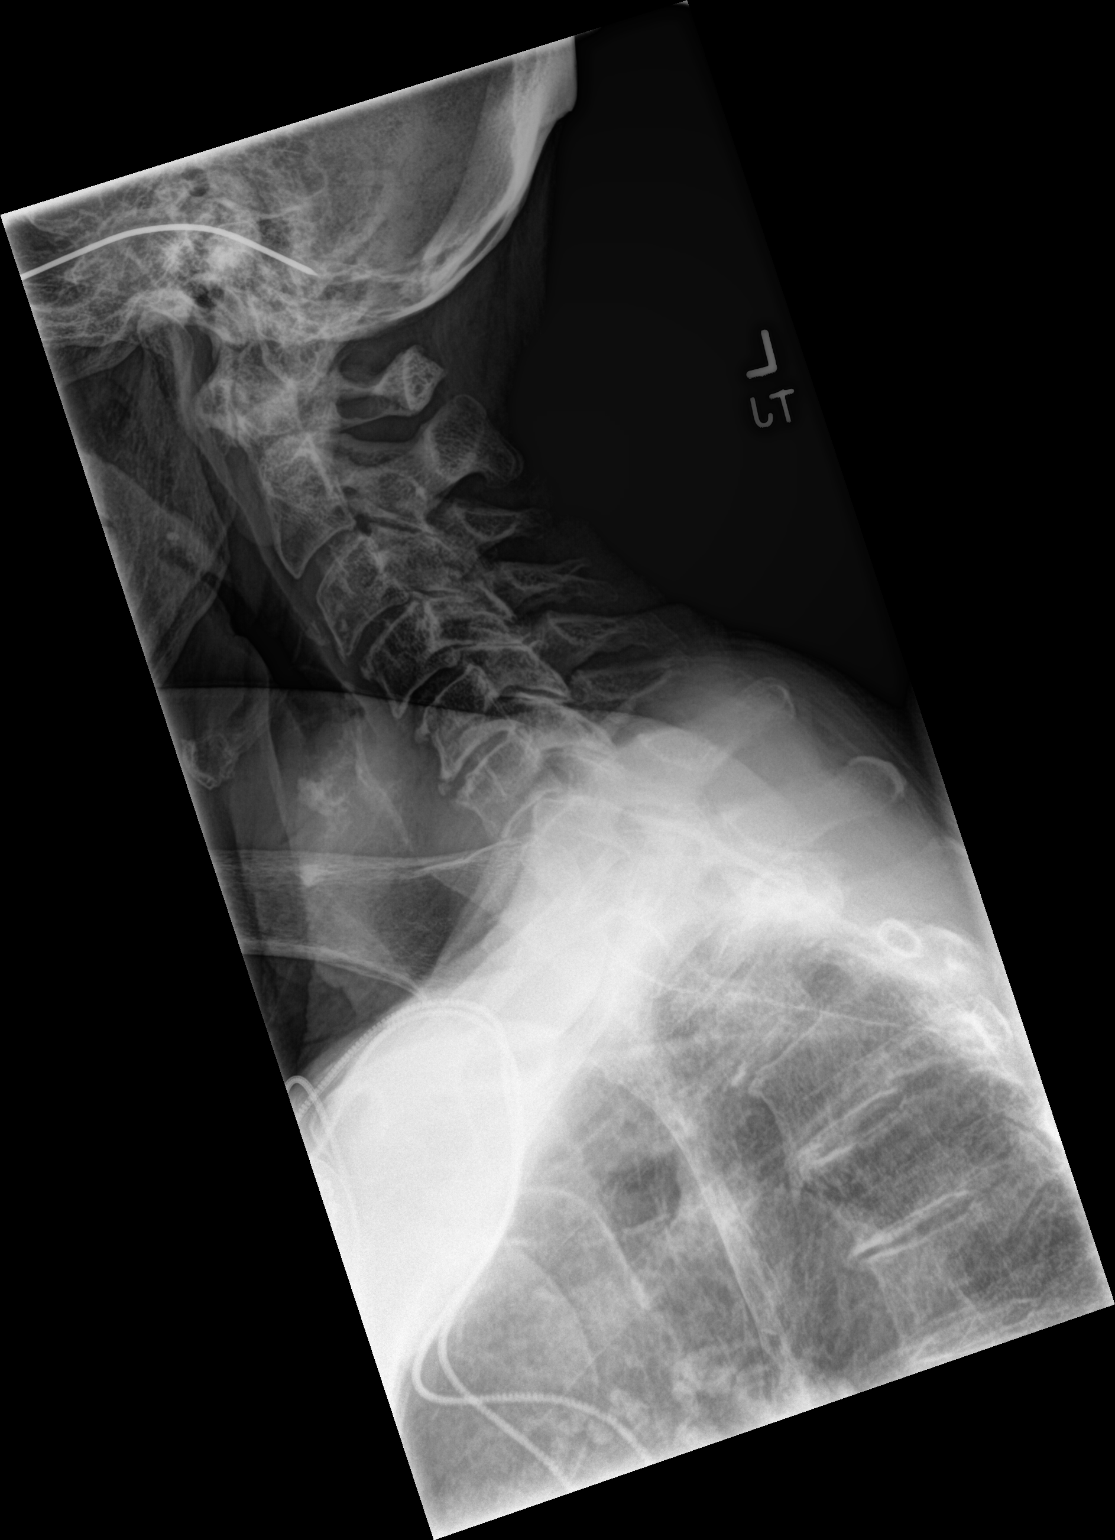

[3 of 3 positions shown; findings below may reference images not displayed]

FINDINGS: T12 compression deformity is noted with 50% anterior vertebral body
height loss. This is new from the prior exam and likely related to
the recent injury. No other compression deformity is seen. No
paraspinal mass is noted. Multiple calcified hilar and mediastinal
nodes are seen consistent with prior granulomatous disease.

Multilevel osteophytic changes are seen. Aortic calcifications are
noted as well as a pacemaker.
IMPRESSION: T12 compression deformity with 50% anterior vertebral body height
loss. This is likely related to the recent injury.

## 2021-11-05 ENCOUNTER — Ambulatory Visit: Payer: PPO

## 2021-11-17 ENCOUNTER — Encounter: Payer: PPO | Admitting: Family Medicine

## 2021-11-19 ENCOUNTER — Ambulatory Visit (INDEPENDENT_AMBULATORY_CARE_PROVIDER_SITE_OTHER): Payer: PPO | Admitting: Family Medicine

## 2021-11-19 ENCOUNTER — Other Ambulatory Visit: Payer: Self-pay

## 2021-11-19 VITALS — BP 128/50 | HR 40 | Temp 98.0°F | Ht 64.5 in | Wt 137.6 lb

## 2021-11-19 DIAGNOSIS — I2510 Atherosclerotic heart disease of native coronary artery without angina pectoris: Secondary | ICD-10-CM | POA: Diagnosis not present

## 2021-11-19 DIAGNOSIS — E876 Hypokalemia: Secondary | ICD-10-CM

## 2021-11-19 DIAGNOSIS — E782 Mixed hyperlipidemia: Secondary | ICD-10-CM | POA: Diagnosis not present

## 2021-11-19 DIAGNOSIS — Z1159 Encounter for screening for other viral diseases: Secondary | ICD-10-CM

## 2021-11-19 DIAGNOSIS — R6889 Other general symptoms and signs: Secondary | ICD-10-CM

## 2021-11-19 DIAGNOSIS — Z0000 Encounter for general adult medical examination without abnormal findings: Secondary | ICD-10-CM

## 2021-11-19 LAB — CBC
HCT: 38.7 % (ref 36.0–46.0)
Hemoglobin: 12.7 g/dL (ref 12.0–15.0)
MCHC: 32.9 g/dL (ref 30.0–36.0)
MCV: 93.6 fl (ref 78.0–100.0)
Platelets: 199 10*3/uL (ref 150.0–400.0)
RBC: 4.14 Mil/uL (ref 3.87–5.11)
RDW: 13.5 % (ref 11.5–15.5)
WBC: 5.5 10*3/uL (ref 4.0–10.5)

## 2021-11-19 LAB — TSH: TSH: 2.53 u[IU]/mL (ref 0.35–5.50)

## 2021-11-19 LAB — FERRITIN: Ferritin: 120.8 ng/mL (ref 10.0–291.0)

## 2021-11-19 NOTE — Patient Instructions (Addendum)
Labs today  Referral to social work consult - they will call to see if you have any transportation support  Keep doing weight bearing exercise (walking) for bone health  Continue with speciality care

## 2021-11-19 NOTE — Progress Notes (Signed)
Subjective:   Barbara Marks is a 88 y.o. female who presents for Medicare Annual (Subsequent) preventive examination.  Review of Systems    Review of Systems  Constitutional:  Negative for chills and fever.  HENT:  Negative for congestion and sore throat.   Eyes:  Negative for blurred vision and double vision.  Respiratory:  Negative for shortness of breath.   Cardiovascular:  Negative for chest pain.  Gastrointestinal:  Negative for heartburn, nausea and vomiting.  Genitourinary: Negative.   Musculoskeletal: Negative.  Negative for myalgias.  Skin:  Negative for rash.  Neurological:  Negative for dizziness and headaches.  Endo/Heme/Allergies:  Does not bruise/bleed easily.  Psychiatric/Behavioral:  Negative for depression. The patient is not nervous/anxious.    Cardiac Risk Factors include: advanced age (>33men, >10 women);dyslipidemia;sedentary lifestyle     Objective:    Today's Vitals   11/19/21 1200  BP: (!) 128/50  Pulse: (!) 40  Temp: 98 F (36.7 C)  TempSrc: Oral  SpO2: 98%  Weight: 137 lb 9 oz (62.4 kg)  Height: 5' 4.5" (1.638 m)   Body mass index is 23.25 kg/m.  Advanced Directives 11/19/2021 09/18/2020 09/14/2020 11/19/2015 09/06/2013 06/22/2013  Does Patient Have a Medical Advance Directive? No No No Yes Patient has advance directive, copy not in chart Patient has advance directive, copy not in chart  Type of Advance Directive - - - [place];Living will Living will -  Does patient want to make changes to medical advance directive? Yes (MAU/Ambulatory/Procedural Areas - Information given) - - No - Patient declined No -  Copy of Healthcare Power of Attorney in Chart? - - - - Copy requested from family -  Would patient like information on creating a medical advance directive? - Yes (MAU/Ambulatory/Procedural Areas - Information given) No - Patient declined - - -  Pre-existing out of facility DNR order (yellow form or pink MOST form) - - - - No  -  Some encounter information is confidential and restricted. Go to Review Flowsheets activity to see all data.    Current Medications (verified) Outpatient Encounter Medications as of 11/19/2021  Medication Sig   amLODipine (NORVASC) 5 MG tablet TAKE 1 TABLET BY MOUTH EVERY MORNING   B Complex Vitamins (B COMPLEX PO) Take by mouth daily.   COLLAGEN PO Take by mouth. 1 scoop powder daily   diclofenac Sodium (VOLTAREN) 1 % GEL Apply 2 g topically 4 (four) times daily.   doxycycline (MONODOX) 100 MG capsule Take 1 capsule (100 mg total) by mouth 2 (two) times daily. Take with food and drink   MAGNESIUM PO Take by mouth daily.   mirtazapine (REMERON) 15 MG tablet Take 1 tablet (15 mg total) by mouth at bedtime as needed (sleep).   Multiple Vitamin (MULTIVITAMIN) tablet Take 1 tablet by mouth daily.     Multiple Vitamins-Minerals (PRESERVISION AREDS) TABS Take 1 tablet by mouth in the morning and at bedtime.    mupirocin ointment (BACTROBAN) 2 % Apply 1 application topically daily. Qd to sore under left breast until healed   naproxen sodium (ALEVE) 220 MG tablet Take 220 mg by mouth daily as needed.   Polyethylene Glycol 3350 (MIRALAX PO) Take by mouth daily as needed.   potassium chloride (MICRO-K) 10 MEQ CR capsule TAKE 1 CAPSULE(10 MEQ) BY MOUTH DAILY   Red Yeast Rice 600 MG CAPS Take 1,200 mg by mouth daily at 12 noon.   TURMERIC PO Take by mouth daily.   [DISCONTINUED] mirtazapine (  REMERON) 15 MG tablet TAKE 1 TABLET BY MOUTH AT BEDTIME   No facility-administered encounter medications on file as of 11/19/2021.    Allergies (verified) Codeine, Influenza vaccines, and Simvastatin   History: Past Medical History:  Diagnosis Date   Anxiety    CAD (coronary artery disease) Non-obstructive   a. 2004 Cath: nonobs dzs.   DDD (degenerative disc disease)    a. with chronic right sided back pain - improves after seeing chiropractor.   Depression    a. h/o ECT   Diverticulitis    Glaucoma     HTN (hypertension)    Hyperlipidemia, mixed    Melanoma of skin ([place]) 2008   resected from Left arm    Memory problem    "states memory issues"   Nephrolithiasis    Pancreatic cyst    a. 25852 Endoscopic U/S: nl UGI tract, 1-5mm pancreatic cysts, no masses/nodules.   Premature ventricular contraction    a. managed with propafenone.   Presence of permanent cardiac pacemaker    Symptomatic bradycardia    a. s/p MDT PPM in 08/2005;  b. 02/2010 Gen change->MDT Adapta DC PPM, ser # [id number].   Past Surgical History:  Procedure Laterality Date   APPENDECTOMY  1952   CATARACT EXTRACTION, BILATERAL     CHOLECYSTECTOMY  1983   EUS N/A 06/22/2013   Procedure: UPPER ENDOSCOPIC ULTRASOUND (EUS) LINEAR;  Surgeon: Milus Banister, MD;  Location: [facility];  Service: Endoscopy;  Laterality: N/A;   EYE SURGERY Bilateral    Cataract Extraction with IOL   PACEMAKER INSERTION Left    Medtronic   TUMOR EXCISION     And nemamgeomas   Family History  Problem Relation Age of Onset   Other Mother        died @ 22 - old age.   Stroke Father        cva after cea in his 59's, died @ 3.   Kidney cancer Brother    Social History   Socioeconomic History   Marital status: Widowed    Spouse name: Not on file   Number of children: 3   Years of education: some college   Highest education level: Not on file  Occupational History   Occupation: Retired    Fish farm manager: RETIRED  Tobacco Use   Smoking status: Never   Smokeless tobacco: Never  Vaping Use   Vaping Use: Never used  Substance and Sexual Activity   Alcohol use: No   Drug use: No   Sexual activity: Not Currently  Other Topics Concern   Not on file  Social History Narrative   Lives in [place]. Lives with a friend (also 61 year - Paul).    3 Kids, 7 grandchildren, 1 great-grandchild   Enjoys: movies on Friday with friends, walks   Exercise: walking around the house for exercise   Diet: likes everything   Social  Determinants of Health   Financial Resource Strain: Not on file  Food Insecurity: Not on file  Transportation Needs: Not on file  Physical Activity: Not on file  Stress: Not on file  Social Connections: Not on file    Tobacco Counseling Counseling given: Not Answered   Clinical Intake:  Pre-visit preparation completed: No  Pain : No/denies pain     BMI - recorded: 23.25 Nutritional Status: BMI of 19-24  Normal Nutritional Risks: None Diabetes: No  How often do you need to have someone help you when you read instructions, pamphlets, or other written  materials from your doctor or pharmacy?: 1 - Never What is the last grade level you completed in school?: some college  Diabetic? no  Interpreter Needed?: No      Activities of Daily Living In your present state of health, do you have any difficulty performing the following activities: 11/19/2021  Hearing? N  Vision? N  Difficulty concentrating or making decisions? Y  Walking or climbing stairs? N  Dressing or bathing? N  Doing errands, shopping? N  Preparing Food and eating ? N  Using the Toilet? N  In the past six months, have you accidently leaked urine? Y  Comment frequency/urgency  Do you have problems with loss of bowel control? N  Managing your Medications? N  Managing your Finances? N  Housekeeping or managing your Housekeeping? N  Some recent data might be hidden    Patient Care Team: Lesleigh Noe, MD as PCP - General (Family Medicine) Kate Sable, MD as PCP - Cardiology (Cardiology) Clapacs, Madie Reno, MD as Consulting Physician (Psychiatry)  Indicate any recent Medical Services you may have received from other than [facility] providers in the past year (date may be approximate).     Assessment:   This is a routine wellness examination for [facility].  Hearing/Vision screen Hearing Screening   250Hz  500Hz  1000Hz  2000Hz  4000Hz   Right ear 25 25 25 25  0  Left ear 20 20 20 20 20   Vision Screening -  Comments:: Last eye exam in 2022 at [place] issues and exercise activities discussed: Current Exercise Habits: Home exercise routine, Type of exercise: walking, Time (Minutes): 10, Frequency (Times/Week): 7, Weekly Exercise (Minutes/Week): 70, Intensity: Mild   Goals Addressed             This Visit's Progress    DIET - EAT MORE FRUITS AND VEGETABLES       Making sure she keeps up with eating      Depression Screen PHQ 2/9 Scores 09/18/2020 09/18/2020 09/18/2020 08/01/2019  PHQ - 2 Score 0 0 0 1  PHQ- 9 Score - 0 - 2    Fall Risk Fall Risk  11/19/2021 09/18/2020 09/18/2020 08/01/2019 05/03/2019  Falls in the past year? 0 1 1 0 (No Data)  Comment - - - - Emmi Telephone Survey: data to providers prior to load  Number falls in past yr: 0 0 0 0 (No Data)  Comment - - - - Emmi Telephone Survey Actual Response =   Injury with Fall? - 1 1 0 -  Risk for fall due to : History of fall(s);Impaired balance/gait (No Data) - - -  Risk for fall due to: Comment - fell off ladder - - -  Follow up - Falls prevention discussed;Falls evaluation completed - - -    FALL RISK PREVENTION PERTAINING TO THE HOME:  Any stairs in or around the home? No  If so, are there any without handrails?  N/a Home free of loose throw rugs in walkways, pet beds, electrical cords, etc? Yes  Adequate lighting in your home to reduce risk of falls? Yes   ASSISTIVE DEVICES UTILIZED TO PREVENT FALLS:  Life alert? No  Use of a cane, walker or w/c? No  Grab bars in the bathroom? Yes  Shower chair or bench in shower? Yes  Elevated toilet seat or a handicapped toilet? Yes   Cognitive Function: MMSE - Mini Mental State Exam 08/01/2019  Orientation to time 5  Orientation to Place 5  Registration 3  Attention/ Calculation 5  Recall 2  Language- name 2 objects 2  Language- repeat 1  Language- follow 3 step command 3  Language- read & follow direction 1  Write a sentence 1  Copy design 1  Total  score 29  Some encounter information is confidential and restricted. Go to Review Flowsheets activity to see all data.        Mini-Cog - 11/19/21 1221     Normal clock drawing test? yes    How many words correct? 3              Immunizations  There is no immunization history on file for this patient.  TDAP status: Due, Education has been provided regarding the importance of this vaccine. Advised may receive this vaccine at local pharmacy or Health Dept. Aware to provide a copy of the vaccination record if obtained from local pharmacy or Health Dept. Verbalized acceptance and understanding.  Flu Vaccine status: Declined, Education has been provided regarding the importance of this vaccine but patient still declined. Advised may receive this vaccine at local pharmacy or Health Dept. Aware to provide a copy of the vaccination record if obtained from local pharmacy or Health Dept. Verbalized acceptance and understanding.  Pneumococcal vaccine status: Declined,  Education has been provided regarding the importance of this vaccine but patient still declined. Advised may receive this vaccine at local pharmacy or Health Dept. Aware to provide a copy of the vaccination record if obtained from local pharmacy or Health Dept. Verbalized acceptance and understanding.   Covid-19 vaccine status: Declined, Education has been provided regarding the importance of this vaccine but patient still declined. Advised may receive this vaccine at local pharmacy or Health Dept.or vaccine clinic. Aware to provide a copy of the vaccination record if obtained from local pharmacy or Health Dept. Verbalized acceptance and understanding.  Qualifies for Shingles Vaccine? Yes   Zostavax completed No   Shingrix Completed?: No.    Education has been provided regarding the importance of this vaccine. Patient has been advised to call insurance company to determine out of pocket expense if they have not yet received this  vaccine. Advised may also receive vaccine at local pharmacy or Health Dept. Verbalized acceptance and understanding.  Screening Tests Health Maintenance  Topic Date Due   COVID-19 Vaccine (1) Never done   TETANUS/TDAP  Never done   Zoster Vaccines- Shingrix (1 of 2) Never done   Pneumonia Vaccine 47+ Years old (1 - PCV) Never done   DEXA SCAN  Never done   HPV VACCINES  Aged Out   INFLUENZA VACCINE  Discontinued    Health Maintenance  Health Maintenance Due  Topic Date Due   COVID-19 Vaccine (1) Never done   TETANUS/TDAP  Never done   Zoster Vaccines- Shingrix (1 of 2) Never done   Pneumonia Vaccine 60+ Years old (1 - PCV) Never done   DEXA SCAN  Never done    Colorectal cancer screening: No longer required.   Mammogram status: No longer required due to age.  Bone Density - declined  Lung Cancer Screening: (Low Dose CT Chest recommended if Age 86-80 years, 30 pack-year currently smoking OR have quit w/in 15years.) does qualify.   Lung Cancer Screening Referral: n/a  Additional Screening:  Hepatitis C Screening: does qualify; Completed never ordered  Vision Screening: Recommended annual ophthalmology exams for early detection of glaucoma and other disorders of the eye. Is the patient up to date with their annual eye exam?  Yes  Who is the provider or what is the name of the office in which the patient attends annual eye exams? [facility] If pt is not established with a provider, would they like to be referred to a provider to establish care?  N/a .   Dental Screening: Recommended annual dental exams for proper oral hygiene  Community Resource Referral / Chronic Care Management: CRR required this visit?  No   CCM required this visit?  No      Plan:     I have personally reviewed and noted the following in the patients chart:   Medical and social history Use of alcohol, tobacco or illicit drugs  Current medications and supplements including opioid  prescriptions.  Functional ability and status Nutritional status Physical activity Advanced directives List of other physicians Hospitalizations, surgeries, and ER visits in previous 12 months Vitals Screenings to include cognitive, depression, and falls Referrals and appointments  In addition, I have reviewed and discussed with patient certain preventive protocols, quality metrics, and best practice recommendations. A written personalized care plan for preventive services as well as general preventive health recommendations were provided to patient.     Lesleigh Noe, MD   11/19/2021

## 2021-11-20 ENCOUNTER — Telehealth: Payer: Self-pay | Admitting: *Deleted

## 2021-11-20 LAB — COMPREHENSIVE METABOLIC PANEL
ALT: 20 U/L (ref 0–35)
AST: 27 U/L (ref 0–37)
Albumin: 3.9 g/dL (ref 3.5–5.2)
Alkaline Phosphatase: 70 U/L (ref 39–117)
BUN: 13 mg/dL (ref 6–23)
CO2: 29 mEq/L (ref 19–32)
Calcium: 9.6 mg/dL (ref 8.4–10.5)
Chloride: 103 mEq/L (ref 96–112)
Creatinine, Ser: 0.75 mg/dL (ref 0.40–1.20)
GFR: 72.69 mL/min (ref >60.00)
Glucose, Bld: 100 mg/dL — ABNORMAL HIGH (ref 70–99)
Potassium: 3.6 mEq/L (ref 3.5–5.1)
Sodium: 144 mEq/L (ref 135–145)
Total Bilirubin: 0.7 mg/dL (ref 0.2–1.2)
Total Protein: 6.9 g/dL (ref 6.0–8.3)

## 2021-11-20 LAB — LIPID PANEL
Cholesterol: 225 mg/dL — ABNORMAL HIGH (ref 0–200)
HDL: 52.5 mg/dL (ref >39.00)
LDL Cholesterol: 140 mg/dL — ABNORMAL HIGH (ref 0–99)
NonHDL: 172.55
Total CHOL/HDL Ratio: 4
Triglycerides: 163 mg/dL — ABNORMAL HIGH (ref 0.0–149.0)
VLDL: 32.6 mg/dL (ref 0.0–40.0)

## 2021-11-20 LAB — HEPATITIS C ANTIBODY
Hepatitis C Ab: NONREACTIVE
SIGNAL TO CUT-OFF: <0.02 (ref <1.00)

## 2021-11-20 NOTE — Telephone Encounter (Signed)
° °  Telephone encounter was:  Successful.  11/20/2021 Name: [name] MRN: [id number] DOB: 09-04-1934  Barbara Marks is a 88 y.o. year old female who is a primary care patient of Einar Pheasant, Jobe Marker, MD . The community resource team was consulted for assistance with Transportation Needs   Care guide performed the following interventions: Patient provided with information about care guide support team and interviewed to confirm resource needs.Patient has no scheduled appointments at this time so will mail her [place] [employer] information and application also is [facility] patient so made aware she could ask Dr to refer again if appt needed later as she has no future needs that she knows about   Follow Up Plan:  No further follow up planned at this time. The patient has been provided with needed resources.  [place], Care Management  [phone] [address] , [place] [address] Email : [email or web address]

## 2021-11-26 ENCOUNTER — Encounter: Payer: Self-pay | Admitting: Family Medicine

## 2021-11-26 ENCOUNTER — Telehealth: Payer: Self-pay | Admitting: *Deleted

## 2021-11-26 DIAGNOSIS — Z789 Other specified health status: Secondary | ICD-10-CM | POA: Insufficient documentation

## 2021-11-26 NOTE — Telephone Encounter (Signed)
° °  Telephone encounter was:  Successful.  11/26/2021 Name: Barbara Marks MRN: [id number] DOB: 12-Feb-1934  Barbara Marks is a 88 y.o. year old female who is a primary care patient of Einar Pheasant, Jobe Marker, MD . The community resource team was consulted for assistance with Transportation Needs   Care guide performed the following interventions: Follow up call placed to community resources to determine status of patients referral Follow up call placed to the patient to discuss status of referral.Patient received mailing and will let me know if she needs to book anything before everything is setup  Follow Up Plan:  No further follow up planned at this time. The patient has been provided with needed resources.  [facility], Care Management  [phone] [address] , [place] [address] Email : [email or web address]

## 2021-12-21 ENCOUNTER — Other Ambulatory Visit: Payer: Self-pay | Admitting: Family Medicine

## 2021-12-21 DIAGNOSIS — E876 Hypokalemia: Secondary | ICD-10-CM

## 2021-12-28 ENCOUNTER — Other Ambulatory Visit: Payer: Self-pay | Admitting: Family Medicine

## 2022-03-23 ENCOUNTER — Inpatient Hospital Stay
Admission: RE | Admit: 2022-03-23 | Discharge: 2022-03-26 | DRG: 071 | Disposition: A | Discharge status: Home / Self Care | Payer: PPO | Source: Ambulatory Visit | Attending: Internal Medicine | Admitting: Internal Medicine

## 2022-03-23 ENCOUNTER — Other Ambulatory Visit: Payer: Self-pay | Admitting: Psychiatry

## 2022-03-23 ENCOUNTER — Encounter: Payer: Self-pay | Admitting: Certified Registered Nurse Anesthetist

## 2022-03-23 ENCOUNTER — Encounter: Payer: Self-pay | Admitting: Physician Assistant

## 2022-03-23 VITALS — BP 123/63 | HR 76 | Temp 98.0°F | Resp 19 | Ht 66.0 in | Wt 136.7 lb

## 2022-03-23 DIAGNOSIS — I10 Essential (primary) hypertension: Secondary | ICD-10-CM | POA: Diagnosis not present

## 2022-03-23 DIAGNOSIS — Z79899 Other long term (current) drug therapy: Secondary | ICD-10-CM | POA: Diagnosis not present

## 2022-03-23 DIAGNOSIS — E782 Mixed hyperlipidemia: Secondary | ICD-10-CM | POA: Diagnosis not present

## 2022-03-23 DIAGNOSIS — E876 Hypokalemia: Secondary | ICD-10-CM | POA: Diagnosis not present

## 2022-03-23 DIAGNOSIS — Z888 Allergy status to other drugs, medicaments and biological substances status: Secondary | ICD-10-CM | POA: Diagnosis not present

## 2022-03-23 DIAGNOSIS — I2510 Atherosclerotic heart disease of native coronary artery without angina pectoris: Secondary | ICD-10-CM | POA: Diagnosis present

## 2022-03-23 DIAGNOSIS — Z9842 Cataract extraction status, left eye: Secondary | ICD-10-CM

## 2022-03-23 DIAGNOSIS — I5022 Chronic systolic (congestive) heart failure: Secondary | ICD-10-CM | POA: Diagnosis not present

## 2022-03-23 DIAGNOSIS — Z9841 Cataract extraction status, right eye: Secondary | ICD-10-CM | POA: Diagnosis not present

## 2022-03-23 DIAGNOSIS — R001 Bradycardia, unspecified: Secondary | ICD-10-CM | POA: Diagnosis not present

## 2022-03-23 DIAGNOSIS — Z885 Allergy status to narcotic agent status: Secondary | ICD-10-CM | POA: Diagnosis not present

## 2022-03-23 DIAGNOSIS — F419 Anxiety disorder, unspecified: Secondary | ICD-10-CM | POA: Diagnosis present

## 2022-03-23 DIAGNOSIS — F332 Major depressive disorder, recurrent severe without psychotic features: Secondary | ICD-10-CM | POA: Diagnosis not present

## 2022-03-23 DIAGNOSIS — H409 Unspecified glaucoma: Secondary | ICD-10-CM | POA: Diagnosis not present

## 2022-03-23 DIAGNOSIS — I493 Ventricular premature depolarization: Secondary | ICD-10-CM

## 2022-03-23 DIAGNOSIS — I447 Left bundle-branch block, unspecified: Principal | ICD-10-CM

## 2022-03-23 DIAGNOSIS — Z8051 Family history of malignant neoplasm of kidney: Secondary | ICD-10-CM | POA: Diagnosis not present

## 2022-03-23 DIAGNOSIS — Z823 Family history of stroke: Secondary | ICD-10-CM | POA: Diagnosis not present

## 2022-03-23 DIAGNOSIS — E785 Hyperlipidemia, unspecified: Secondary | ICD-10-CM | POA: Diagnosis not present

## 2022-03-23 DIAGNOSIS — G9341 Metabolic encephalopathy: Secondary | ICD-10-CM | POA: Diagnosis not present

## 2022-03-23 DIAGNOSIS — Z950 Presence of cardiac pacemaker: Secondary | ICD-10-CM | POA: Diagnosis not present

## 2022-03-23 DIAGNOSIS — Z85820 Personal history of malignant melanoma of skin: Secondary | ICD-10-CM | POA: Diagnosis not present

## 2022-03-23 DIAGNOSIS — F329 Major depressive disorder, single episode, unspecified: Secondary | ICD-10-CM | POA: Diagnosis present

## 2022-03-23 DIAGNOSIS — Z961 Presence of intraocular lens: Secondary | ICD-10-CM | POA: Diagnosis not present

## 2022-03-23 HISTORY — DX: Chronic systolic (congestive) heart failure: I50.22

## 2022-03-23 LAB — CBC WITH DIFFERENTIAL/PLATELET
Abs Immature Granulocytes: 0.01 10*3/uL (ref 0.00–0.07)
Basophils Absolute: 0 10*3/uL (ref 0.0–0.1)
Basophils Relative: 1 %
Eosinophils Absolute: 0.1 10*3/uL (ref 0.0–0.5)
Eosinophils Relative: 1 %
HCT: 36.1 % (ref 36.0–46.0)
Hemoglobin: 11.8 g/dL — ABNORMAL LOW (ref 12.0–15.0)
Immature Granulocytes: 0 %
Lymphocytes Relative: 29 %
Lymphs Abs: 1.5 10*3/uL (ref 0.7–4.0)
MCH: 30.3 pg (ref 26.0–34.0)
MCHC: 32.7 g/dL (ref 30.0–36.0)
MCV: 92.6 fL (ref 80.0–100.0)
Monocytes Absolute: 0.5 10*3/uL (ref 0.1–1.0)
Monocytes Relative: 10 %
Neutro Abs: 3 10*3/uL (ref 1.7–7.7)
Neutrophils Relative %: 59 %
Platelets: 152 10*3/uL (ref 150–400)
RBC: 3.9 MIL/uL (ref 3.87–5.11)
RDW: 13.4 % (ref 11.5–15.5)
WBC: 5.1 10*3/uL (ref 4.0–10.5)
nRBC: 0 % (ref 0.0–0.2)

## 2022-03-23 LAB — COMPREHENSIVE METABOLIC PANEL
ALT: 28 U/L (ref 0–44)
AST: 33 U/L (ref 15–41)
Albumin: 3.4 g/dL — ABNORMAL LOW (ref 3.5–5.0)
Alkaline Phosphatase: 63 U/L (ref 38–126)
Anion gap: 8 (ref 5–15)
BUN: 18 mg/dL (ref 8–23)
CO2: 25 mmol/L (ref 22–32)
Calcium: 9 mg/dL (ref 8.9–10.3)
Chloride: 111 mmol/L (ref 98–111)
Creatinine, Ser: 0.67 mg/dL (ref 0.44–1.00)
GFR, Estimated: >60 mL/min (ref >60)
Glucose, Bld: 106 mg/dL — ABNORMAL HIGH (ref 70–99)
Potassium: 3.4 mmol/L — ABNORMAL LOW (ref 3.5–5.1)
Sodium: 144 mmol/L (ref 135–145)
Total Bilirubin: 1.1 mg/dL (ref 0.3–1.2)
Total Protein: 6.1 g/dL — ABNORMAL LOW (ref 6.5–8.1)

## 2022-03-23 LAB — BRAIN NATRIURETIC PEPTIDE: B Natriuretic Peptide: 147.4 pg/mL — ABNORMAL HIGH (ref 0.0–100.0)

## 2022-03-23 LAB — TSH: TSH: 3.35 u[IU]/mL (ref 0.350–4.500)

## 2022-03-23 LAB — PACEMAKER DEVICE OBSERVATION

## 2022-03-23 MED ORDER — DIPHENHYDRAMINE HCL 50 MG/ML IJ SOLN
INTRAMUSCULAR | Status: AC
  Administered 2022-03-23: 12.5 mg via INTRAVENOUS
  Filled 2022-03-23: qty 1

## 2022-03-23 MED ORDER — DICLOFENAC SODIUM 1 % EX GEL
2.0000 g | Freq: Four times a day (QID) | CUTANEOUS | Status: DC
  Administered 2022-03-24 – 2022-03-25 (×4): 2 g via TOPICAL
  Filled 2022-03-23: qty 100

## 2022-03-23 MED ORDER — POLYETHYLENE GLYCOL 3350 17 GM/SCOOP PO POWD: 1.0000 | Freq: Every day | ORAL | Status: DC | PRN

## 2022-03-23 MED ORDER — PROPOFOL 10 MG/ML IV BOLUS
INTRAVENOUS | Status: AC
  Filled 2022-03-23: qty 20

## 2022-03-23 MED ORDER — FENTANYL CITRATE (PF) 100 MCG/2ML IJ SOLN: 25.0000 ug | INTRAMUSCULAR | Status: DC | PRN

## 2022-03-23 MED ORDER — MUPIROCIN 2 % EX OINT
1.0000 | TOPICAL_OINTMENT | Freq: Every day | CUTANEOUS | Status: DC
Start: 2022-03-23 — End: 2022-03-26
  Administered 2022-03-25 – 2022-03-26 (×2): 1 via TOPICAL
  Filled 2022-03-23: qty 22

## 2022-03-23 MED ORDER — TURMERIC 500 MG PO CAPS: ORAL_CAPSULE | Freq: Every day | ORAL | Status: DC

## 2022-03-23 MED ORDER — SUCCINYLCHOLINE CHLORIDE 200 MG/10ML IV SOSY
PREFILLED_SYRINGE | INTRAVENOUS | Status: AC
  Filled 2022-03-23: qty 10

## 2022-03-23 MED ORDER — MUPIROCIN 2 % EX OINT
1.0000 | TOPICAL_OINTMENT | Freq: Every day | CUTANEOUS | Status: DC
Start: 2022-03-23 — End: 2022-03-23
  Filled 2022-03-23: qty 22

## 2022-03-23 MED ORDER — KETAMINE HCL 50 MG/5ML IJ SOSY
PREFILLED_SYRINGE | INTRAMUSCULAR | Status: AC
  Filled 2022-03-23: qty 5

## 2022-03-23 MED ORDER — B COMPLEX-C PO TABS
1.0000 | ORAL_TABLET | Freq: Every day | ORAL | Status: DC
  Administered 2022-03-24 – 2022-03-26 (×3): 1 via ORAL
  Filled 2022-03-23 (×3): qty 1

## 2022-03-23 MED ORDER — ACETAMINOPHEN 325 MG PO TABS: 650.0000 mg | ORAL_TABLET | Freq: Four times a day (QID) | ORAL | Status: DC | PRN

## 2022-03-23 MED ORDER — MELATONIN 5 MG PO TABS
5.0000 mg | ORAL_TABLET | Freq: Every evening | ORAL | Status: DC | PRN
Start: 2022-03-23 — End: 2022-03-26
  Administered 2022-03-23 – 2022-03-25 (×3): 5 mg via ORAL
  Filled 2022-03-23 (×3): qty 1

## 2022-03-23 MED ORDER — POLYETHYLENE GLYCOL 3350 17 G PO PACK: 17.0000 g | PACK | Freq: Every day | ORAL | Status: DC | PRN

## 2022-03-23 MED ORDER — SODIUM CHLORIDE 0.9 % IV SOLN
500.0000 mL | Freq: Once | INTRAVENOUS | Status: AC
  Administered 2022-03-23: 500 mL via INTRAVENOUS

## 2022-03-23 MED ORDER — B COMPLEX-C PO TABS
1.0000 | ORAL_TABLET | Freq: Every day | ORAL | Status: DC
  Filled 2022-03-23: qty 1

## 2022-03-23 MED ORDER — SODIUM CHLORIDE 0.9 % IV SOLN: INTRAVENOUS | Status: DC | PRN

## 2022-03-23 MED ORDER — MAGNESIUM LACTATE 84 MG (7MEQ) PO TBCR
84.0000 mg | EXTENDED_RELEASE_TABLET | Freq: Every day | ORAL | Status: DC
  Administered 2022-03-24 – 2022-03-26 (×3): 84 mg via ORAL
  Filled 2022-03-23 (×3): qty 1

## 2022-03-23 MED ORDER — HYDRALAZINE HCL 20 MG/ML IJ SOLN
5.0000 mg | INTRAMUSCULAR | Status: DC | PRN
  Administered 2022-03-25: 5 mg via INTRAVENOUS
  Filled 2022-03-23: qty 1

## 2022-03-23 MED ORDER — KETAMINE HCL 10 MG/ML IJ SOLN
INTRAMUSCULAR | Status: DC | PRN
  Administered 2022-03-23: 50 mg via INTRAVENOUS

## 2022-03-23 MED ORDER — AMLODIPINE BESYLATE 5 MG PO TABS
5.0000 mg | ORAL_TABLET | Freq: Every morning | ORAL | Status: DC
  Administered 2022-03-24 – 2022-03-25 (×2): 5 mg via ORAL
  Filled 2022-03-23 (×2): qty 1

## 2022-03-23 MED ORDER — ENOXAPARIN SODIUM 40 MG/0.4ML IJ SOSY
40.0000 mg | PREFILLED_SYRINGE | INTRAMUSCULAR | Status: DC
  Administered 2022-03-25: 40 mg via SUBCUTANEOUS
  Filled 2022-03-23 (×2): qty 0.4

## 2022-03-23 MED ORDER — SODIUM CHLORIDE 0.9 % IV SOLN: 500.0000 mL | Freq: Once | INTRAVENOUS | Status: AC

## 2022-03-23 MED ORDER — MIRTAZAPINE 15 MG PO TABS
15.0000 mg | ORAL_TABLET | Freq: Every evening | ORAL | Status: DC | PRN
  Administered 2022-03-25: 15 mg via ORAL
  Filled 2022-03-23: qty 1

## 2022-03-23 MED ORDER — RED YEAST RICE 600 MG PO CAPS: 1200.0000 mg | ORAL_CAPSULE | Freq: Every day | ORAL | Status: DC

## 2022-03-23 MED ORDER — SUCCINYLCHOLINE CHLORIDE 200 MG/10ML IV SOSY
PREFILLED_SYRINGE | INTRAVENOUS | Status: DC | PRN
  Administered 2022-03-23: 90 mg via INTRAVENOUS

## 2022-03-23 MED ORDER — MAGNESIUM LACTATE 84 MG (7MEQ) PO TBCR
84.0000 mg | EXTENDED_RELEASE_TABLET | Freq: Every day | ORAL | Status: DC
  Filled 2022-03-23: qty 1

## 2022-03-23 MED ORDER — NAPROXEN 250 MG PO TABS
250.0000 mg | ORAL_TABLET | Freq: Every day | ORAL | Status: DC | PRN
Start: 2022-03-24 — End: 2022-03-26

## 2022-03-23 MED ORDER — ATROPINE SULFATE 1 MG/ML IV SOLN: 1.0000 mg | INTRAVENOUS | Status: DC | PRN

## 2022-03-23 MED ORDER — GLYCOPYRROLATE 0.2 MG/ML IJ SOLN
INTRAMUSCULAR | Status: DC | PRN
  Administered 2022-03-23: .2 mg via INTRAVENOUS

## 2022-03-23 MED ORDER — PROPOFOL 10 MG/ML IV BOLUS
INTRAVENOUS | Status: DC | PRN
  Administered 2022-03-23: 20 mg via INTRAVENOUS

## 2022-03-23 MED ORDER — DIPHENHYDRAMINE HCL 50 MG/ML IJ SOLN
12.5000 mg | Freq: Three times a day (TID) | INTRAMUSCULAR | Status: DC | PRN
  Administered 2022-03-25: 12.5 mg via INTRAVENOUS
  Filled 2022-03-23: qty 1

## 2022-03-23 MED ORDER — POTASSIUM CHLORIDE CRYS ER 20 MEQ PO TBCR
40.0000 meq | EXTENDED_RELEASE_TABLET | Freq: Once | ORAL | Status: AC
  Administered 2022-03-23: 40 meq via ORAL
  Filled 2022-03-23: qty 2

## 2022-03-23 MED ORDER — GLYCOPYRROLATE 0.2 MG/ML IJ SOLN
INTRAMUSCULAR | Status: AC
  Filled 2022-03-23: qty 1

## 2022-03-23 MED ORDER — ADULT MULTIVITAMIN W/MINERALS CH
1.0000 | ORAL_TABLET | Freq: Every day | ORAL | Status: DC
  Administered 2022-03-24 – 2022-03-26 (×3): 1 via ORAL
  Filled 2022-03-23 (×3): qty 1

## 2022-03-23 NOTE — H&P (Signed)
Barbara Marks is an 88 y.o. female.   Chief Complaint: Patient reports that her depression has been worse now for weeks.  She regrets putting off ECT.  She has had poor appetite and been losing weight.  Admits to some morbid thoughts but denies intent or plan to harm herself.  Not taking any medicine currently for depression. HPI: Severe recurrent depression off and dependent on ECT but in recent years has been avoiding it  Past Medical History:  Diagnosis Date   Anxiety    CAD (coronary artery disease) Non-obstructive   a. 2004 Cath: nonobs dzs.   DDD (degenerative disc disease)    a. with chronic right sided back pain - improves after seeing chiropractor.   Depression    a. h/o ECT   Diverticulitis    Glaucoma    HTN (hypertension)    Hyperlipidemia, mixed    Melanoma of skin ([place]) 2008   resected from Left arm    Memory problem    "states memory issues"   Nephrolithiasis    Pancreatic cyst    a. 68127 Endoscopic U/S: nl UGI tract, 1-82m pancreatic cysts, no masses/nodules.   Premature ventricular contraction    a. managed with propafenone.   Presence of permanent cardiac pacemaker    Symptomatic bradycardia    a. s/p MDT PPM in 08/2005;  b. 02/2010 Gen change->MDT Adapta DC PPM, ser # [id number].    Past Surgical History:  Procedure Laterality Date   APPENDECTOMY  1952   CATARACT EXTRACTION, BILATERAL     CHOLECYSTECTOMY  1983   EUS N/A 06/22/2013   Procedure: UPPER ENDOSCOPIC ULTRASOUND (EUS) LINEAR;  Surgeon: DMilus Banister MD;  Location: [facility];  Service: Endoscopy;  Laterality: N/A;   EYE SURGERY Bilateral    Cataract Extraction with IOL   PACEMAKER INSERTION Left    Medtronic   TUMOR EXCISION     And nemamgeomas    Family History  Problem Relation Age of Onset   Other Mother        died @ 934- old age.   Stroke Father        cva after cea in his 520's died @ 748   Kidney cancer Brother    Social History:  reports that she has never smoked. She has  never used smokeless tobacco. She reports that she does not drink alcohol and does not use drugs.  Allergies:  Allergies  Allergen Reactions   Codeine     unknown   Influenza Vaccines Cough, Hypertension and Nausea And Vomiting   Simvastatin Other (See Comments)    "leg cramps"   Statins Other (See Comments)    Pt states that she just can't handle Statin medications     (Not in a hospital admission)   Results for orders placed or performed during the hospital encounter of 03/23/22 (from the past 48 hour(s))  TSH     Status: None   Collection Time: 03/23/22 12:27 PM  Result Value Ref Range   TSH 3.350 0.350 - 4.500 uIU/mL    Comment: Performed by a 3rd Generation assay with a functional sensitivity of <=0.01 uIU/mL. Performed at [facility] [address], [place] [address]  CBC with Differential/Platelet     Status: Abnormal   Collection Time: 03/23/22 12:27 PM  Result Value Ref Range   WBC 5.1 4.0 - 10.5 K/uL   RBC 3.90 3.87 - 5.11 MIL/uL   Hemoglobin 11.8 (L) 12.0 - 15.0  g/dL   HCT 36.1 36.0 - 46.0 %   MCV 92.6 80.0 - 100.0 fL   MCH 30.3 26.0 - 34.0 pg   MCHC 32.7 30.0 - 36.0 g/dL   RDW 13.4 11.5 - 15.5 %   Platelets 152 150 - 400 K/uL   nRBC 0.0 0.0 - 0.2 %   Neutrophils Relative % 59 %   Neutro Abs 3.0 1.7 - 7.7 K/uL   Lymphocytes Relative 29 %   Lymphs Abs 1.5 0.7 - 4.0 K/uL   Monocytes Relative 10 %   Monocytes Absolute 0.5 0.1 - 1.0 K/uL   Eosinophils Relative 1 %   Eosinophils Absolute 0.1 0.0 - 0.5 K/uL   Basophils Relative 1 %   Basophils Absolute 0.0 0.0 - 0.1 K/uL   Immature Granulocytes 0 %   Abs Immature Granulocytes 0.01 0.00 - 0.07 K/uL    Comment: Performed at [facility], [address] [place], [place] [address]  Comprehensive metabolic panel     Status: Abnormal   Collection Time: 03/23/22 12:27 PM  Result Value Ref Range   Sodium 144 135 - 145 mmol/L   Potassium 3.4 (L) 3.5 - 5.1 mmol/L   Chloride 111 98 -  111 mmol/L   CO2 25 22 - 32 mmol/L   Glucose, Bld 106 (H) 70 - 99 mg/dL    Comment: Glucose reference range applies only to samples taken after fasting for at least 8 hours.   BUN 18 8 - 23 mg/dL   Creatinine, Ser 0.67 0.44 - 1.00 mg/dL   Calcium 9.0 8.9 - 10.3 mg/dL   Total Protein 6.1 (L) 6.5 - 8.1 g/dL   Albumin 3.4 (L) 3.5 - 5.0 g/dL   AST 33 15 - 41 U/L   ALT 28 0 - 44 U/L   Alkaline Phosphatase 63 38 - 126 U/L   Total Bilirubin 1.1 0.3 - 1.2 mg/dL   GFR, Estimated >60 >60 mL/min    Comment: (NOTE) Calculated using the CKD-EPI Creatinine Equation (2021)    Anion gap 8 5 - 15    Comment: Performed at [facility], [address], [place], [place] [address]   No results found.  Review of Systems  Constitutional:  Positive for fatigue and unexpected weight change.  HENT: Negative.    Eyes: Negative.   Respiratory: Negative.    Cardiovascular: Negative.   Gastrointestinal: Negative.   Musculoskeletal: Negative.   Skin: Negative.   Neurological: Negative.   Psychiatric/Behavioral:  Positive for dysphoric mood and suicidal ideas.     Blood pressure (!) 164/83, pulse 84, temperature 98.4 F (36.9 C), temperature source Tympanic, resp. rate 17, height '5\' 6"'$  (1.676 m), weight 60.8 kg, SpO2 99 %. Physical Exam Constitutional:      Appearance: She is well-developed. She is ill-appearing.  HENT:     Head: Normocephalic and atraumatic.  Eyes:     Conjunctiva/sclera: Conjunctivae normal.     Pupils: Pupils are equal, round, and reactive to light.  Cardiovascular:     Heart sounds: Normal heart sounds.  Pulmonary:     Effort: Pulmonary effort is normal.  Abdominal:     Palpations: Abdomen is soft.  Musculoskeletal:        General: Normal range of motion.     Cervical back: Normal range of motion.  Skin:    General: Skin is warm and dry.  Psychiatric:        Attention and Perception: She is inattentive.  Mood and Affect: Mood is depressed.         Speech: Speech is delayed.        Behavior: Behavior is slowed.        Thought Content: Thought content includes suicidal ideation. Thought content does not include suicidal plan.      Assessment/Plan Patient needs treatment for depression.  I offered her the opportunity of voluntary admission to the geriatric psychiatry ward on the grounds that this would give maximum safety while we got her most completely treated.  She declines.  Patient does not meet commitment criteria.  We will proceed with ECT today and I told her I would like her to come in 3 times a week for treatment and she said she thinks she can arrange it.  [name], MD 03/23/2022, 1:36 PM

## 2022-03-23 NOTE — Procedures (Signed)
ECT SERVICES Physician's Interval Evaluation & Treatment Note  Patient Identification: Barbara Marks MRN:  [id number] Date of Evaluation:  03/23/2022 TX #: 22 but really #1 in what I hope will be an index course  MADRS:   MMSE:   P.E. Findings:  Patient is looking like she has lost weight and seems ill and tired.  Psychiatric Interval Note:  Admits to being depressed and not eating well not taking care of herself very well  Subjective:  Patient is a 88 y.o. female seen for evaluation for Electroconvulsive Therapy. Suicidal thoughts without any intention or plan at all.  Very depressed.  No evidence psychosis  Treatment Summary:   '[]'$   Right Unilateral             '[x]'$  Bilateral   % Energy : 1.0 ms 75%   Impedance: 2620 ohms  Seizure Energy Index: 2942 V squared  Postictal Suppression Index: No finding  Seizure Concordance Index: 97%  Medications  Pre Shock: Robinul 80 mg succinylcholine 90 mg Toradol 15 mg  Post Shock:    Seizure Duration: EMG 40 seconds EEG 65 seconds   Comments: Advised patient that she needs to be coming for an index course.  She is very depressed.  I offered her the opportunity of admission to the psychiatric unit which she has declined.  Hope to see her back on Wednesday.  During treatment it was also noted that she was bradycardic with an irregular heart rhythm.  She has a history of heart block in the past.  Current pacemaker not functional.  Tolerated ECT well but we had ask cardiology for consult.  Lungs:  '[x]'$   Clear to auscultation               '[]'$  Other:   Heart:    '[x]'$   Regular rhythm             '[]'$  irregular rhythm    '[x]'$   Previous H&P reviewed, patient examined and there are NO CHANGES                 '[]'$   Previous H&P reviewed, patient examined and there are changes noted.   [name], MD 6/19/20231:43 PM

## 2022-03-23 NOTE — Assessment & Plan Note (Signed)
Status post ECT treatments today.  Patient still confused.  Continue Remeron.

## 2022-03-23 NOTE — Assessment & Plan Note (Addendum)
S/p of PPM-Medtronic. HR at 40s now. Pt is asymptomatic.  Blood pressure 156/78. TSH 3.35. Consulted cardiology, PA [employer]. Per his note, "Case has been discussed with EP with recommendation for device company to reprogram the device".   -Please in PCU for observation -As needed atropine 1 mg for symptomatic bradycardia -pacer at the bedside, Zoll -Telemetry monitoring -Follow-up cardiologist further recommendations -Avoid using nodal blockers

## 2022-03-23 NOTE — Progress Notes (Signed)
Patrice, RN (ECT) has contacted patient friend Tye Maryland, who is her designated contact.

## 2022-03-23 NOTE — Assessment & Plan Note (Signed)
No complaints of chest pain

## 2022-03-23 NOTE — Assessment & Plan Note (Addendum)
No signs of congestive heart failure

## 2022-03-23 NOTE — Anesthesia Procedure Notes (Signed)
Procedure Name: General with mask airway Date/Time: 03/23/2022 11:55 AM  Performed by: Tollie Eth, CRNAPre-anesthesia Checklist: Patient identified, Emergency Drugs available, Suction available and Patient being monitored Patient Re-evaluated:Patient Re-evaluated prior to induction Oxygen Delivery Method: Circle system utilized Preoxygenation: Pre-oxygenation with 100% oxygen Induction Type: IV induction Ventilation: Mask ventilation without difficulty and Mask ventilation throughout procedure Airway Equipment and Method: Bite block Placement Confirmation: positive ETCO2 Dental Injury: Teeth and Oropharynx as per pre-operative assessment

## 2022-03-23 NOTE — Progress Notes (Signed)
Pt arrived to floor. On call cardiology contacted in regard to patient need for zoll at bedside for symptomatic bradycardia. MD would like RN to do so. Also verbally ordered to hold off on the interrogation for tonight.

## 2022-03-23 NOTE — Progress Notes (Addendum)
Patient has zoll pads applied and crash cart/ zoll machine is at the bedside. Patient has been asymptomatic. Patient is stable and Cathy her contact has been notified that she will be admitted overnight, patient has also spoken with Tye Maryland on the phone.

## 2022-03-23 NOTE — Anesthesia Preprocedure Evaluation (Signed)
Anesthesia Evaluation  Patient identified by MRN, date of birth, ID band Patient awake    Reviewed: Allergy & Precautions, H&P , NPO status , Patient's Chart, lab work & pertinent test results, reviewed documented beta blocker date and time   Airway Mallampati: II   Neck ROM: full    Dental  (+) Poor Dentition   Pulmonary neg pulmonary ROS,    Pulmonary exam normal        Cardiovascular Exercise Tolerance: Good hypertension, On Medications + CAD  Normal cardiovascular exam+ dysrhythmias + pacemaker  Rhythm:regular Rate:Normal     Neuro/Psych Anxiety Depression negative neurological ROS  negative psych ROS   GI/Hepatic negative GI ROS, Neg liver ROS,   Endo/Other  negative endocrine ROS  Renal/GU Renal disease  negative genitourinary   Musculoskeletal   Abdominal   Peds  Hematology  (+) Blood dyscrasia, anemia ,   Anesthesia Other Findings Past Medical History: No date: Anxiety Non-obstructive: CAD (coronary artery disease)     Comment:  a. 2004 Cath: nonobs dzs. No date: DDD (degenerative disc disease)     Comment:  a. with chronic right sided back pain - improves after               seeing chiropractor. No date: Depression     Comment:  a. h/o ECT No date: Diverticulitis No date: Glaucoma No date: HTN (hypertension) No date: Hyperlipidemia, mixed 2008: Melanoma of skin ([place])     Comment:  resected from Left arm  No date: Memory problem     Comment:  "states memory issues" No date: Nephrolithiasis No date: Pancreatic cyst     Comment:  a. 73220 Endoscopic U/S: nl UGI tract, 1-76m pancreatic               cysts, no masses/nodules. No date: Premature ventricular contraction     Comment:  a. managed with propafenone. No date: Presence of permanent cardiac pacemaker No date: Symptomatic bradycardia     Comment:  a. s/p MDT PPM in 08/2005;  b. 02/2010 Gen change->MDT               Adapta DC PPM, ser #  [id number]. Past Surgical History: 1952: APPENDECTOMY No date: CATARACT EXTRACTION, BILATERAL 1983: CHOLECYSTECTOMY 06/22/2013: EUS; N/A     Comment:  Procedure: UPPER ENDOSCOPIC ULTRASOUND (EUS) LINEAR;                Surgeon: DMilus Banister MD;  Location: [facility];                Service: Endoscopy;  Laterality: N/A; No date: EYE SURGERY; Bilateral     Comment:  Cataract Extraction with IOL No date: PACEMAKER INSERTION; Left     Comment:  Medtronic No date: TUMOR EXCISION     Comment:  And nemamgeomas   Reproductive/Obstetrics negative OB ROS                             Anesthesia Physical Anesthesia Plan  ASA: 3  Anesthesia Plan: General   Post-op Pain Management:    Induction:   PONV Risk Score and Plan:   Airway Management Planned:   Additional Equipment:   Intra-op Plan:   Post-operative Plan:   Informed Consent: I have reviewed the patients History and Physical, chart, labs and discussed the procedure including the risks, benefits and alternatives for the proposed anesthesia with the patient or authorized representative who has indicated his/her  understanding and acceptance.     Dental Advisory Given  Plan Discussed with: CRNA  Anesthesia Plan Comments:         Anesthesia Quick Evaluation

## 2022-03-23 NOTE — Transfer of Care (Signed)
Immediate Anesthesia Transfer of Care Note  Patient: Barbara Marks  Procedure(s) Performed: ECT TX  Patient Location: PACU  Anesthesia Type:General  Level of Consciousness: drowsy  Airway & Oxygen Therapy: Patient Spontanous Breathing and Patient connected to face mask oxygen  Post-op Assessment: Report given to RN and Post -op Vital signs reviewed and stable, magnet on pacemaker, anesthesiologist aware  Post vital signs: Reviewed and stable  Last Vitals:  Vitals Value Taken Time  BP 163/90 03/23/22 1220  Temp    Pulse 86 03/23/22 1221  Resp 19 03/23/22 1221  SpO2 100 % 03/23/22 1221  Vitals shown include unvalidated device data.  Last Pain:  Vitals:   03/23/22 1129  TempSrc:   PainSc: 0-No pain         Complications: No notable events documented.

## 2022-03-23 NOTE — Progress Notes (Signed)
Patient has been very argumentative today with staff, continues to say that "the pacemaker battery has not been working for years". Although the pacemaker was interrogated by Christell Faith, PA with cardiology and the battery has always been working, and that the patient did not follow up with her pacemaker appointment since 2015. Patient is forgetful also, but did undergo an ECT procedure today. We have been communicating with the patient concearning the care of plan and why she needs to be admitted according to medical advice, patient is more understanting. Definitely has a flat affect, and depressed. Patient speaks very low and it is hard to hear her at times. Belongings have been placed in a patient belonging bag (Clothes, sweater, purse, phone number list, pink denture cup) patient has eyeglasses on and dentures in. Patient is ordering dinner at this time.

## 2022-03-23 NOTE — H&P (Addendum)
History and Physical    [name] EVO:[id number] DOB: [birth year] DOA: 03/23/2022  Referring MD/NP/PA:   PCP: Lesleigh Noe, MD   Patient coming from:  The patient is coming from home.  At baseline, pt is independent for most of ADL.        Chief Complaint: bradycardia  HPI: Barbara Marks is a 88 y.o. female with medical history significant of hypertension, depression with anxiety, pacemaker placement, PVC, CAD, diverticulitis, memory loss, CAD, sCHF with EF of 45-50%, who is admitted due to bradycardia.  Patient is initially here for scheduled ECT treatment due tp depression by Dr. Weber Cooks. After she had ECT procedure, she was was found to have bradycardia with heart rate down to 39, currently HR is at lower 40s.  Patient denies chest pain, shortness breath, lightheadedness or dizziness.  She denies nausea, vomiting, diarrhea or abdominal pain.  No symptoms of UTI.  Patient still feels depressed, but denies suicidal or homicidal ideations.  Of note, doxycycline is on her medication list, which was started for left lat Inframammary epidermal cyst on 05/19/21, but pt states that she is not taking this medication or any antibiotics now,  Data Reviewed and ED Course: pt was found to have WBC 5.1, potassium 3.4, GFR> 60, temperature normal, blood pressure 156/78, RR 22, oxygen saturation 95% on room air.  Patient is placed on PCU for observation. Christell Faith of Card PA is consulted.   EKG: I have personally reviewed.  V pacing at heart rate of 40 bpm without atrial tracking. QTc 564  Review of Systems:   General: no fevers, chills, no body weight gain, has poor appetite, has fatigue HEENT: no blurry vision, hearing changes or sore throat Respiratory: no dyspnea, coughing, wheezing CV: no chest pain, no palpitations GI: no nausea, vomiting, abdominal pain, diarrhea, constipation GU: no dysuria, burning on urination, increased urinary frequency, hematuria  Ext: no leg edema Neuro: no  unilateral weakness, numbness, or tingling, no vision change or hearing loss Skin: no rash, no skin tear. MSK: No muscle spasm, no deformity, no limitation of range of movement in spin Heme: No easy bruising.  Travel history: No recent long distant travel. Psychiatry: Patient has depression   Allergy:  Allergies  Allergen Reactions   Codeine     unknown   Influenza Vaccines Cough, Hypertension and Nausea And Vomiting   Simvastatin Other (See Comments)    "leg cramps"   Statins Other (See Comments)    Pt states that she just can't handle Statin medications     Past Medical History:  Diagnosis Date   Anxiety    CAD (coronary artery disease) Non-obstructive   a. 2004 Cath: nonobs dzs.   Chronic systolic CHF (congestive heart failure) (HCC)    DDD (degenerative disc disease)    a. with chronic right sided back pain - improves after seeing chiropractor.   Depression    a. h/o ECT   Diverticulitis    Glaucoma    HTN (hypertension)    Hyperlipidemia, mixed    Melanoma of skin ([place]) 2008   resected from Left arm    Memory problem    "states memory issues"   Nephrolithiasis    Pancreatic cyst    a. 29937 Endoscopic U/S: nl UGI tract, 1-78m pancreatic cysts, no masses/nodules.   Premature ventricular contraction    a. managed with propafenone.   Presence of permanent cardiac pacemaker    Symptomatic bradycardia    a. s/p MDT PPM in  08/2005;  b. 02/2010 Gen change->MDT Adapta DC PPM, ser # [id number].    Past Surgical History:  Procedure Laterality Date   APPENDECTOMY  1952   CATARACT EXTRACTION, BILATERAL     CHOLECYSTECTOMY  1983   EUS N/A 06/22/2013   Procedure: UPPER ENDOSCOPIC ULTRASOUND (EUS) LINEAR;  Surgeon: Milus Banister, MD;  Location: [facility];  Service: Endoscopy;  Laterality: N/A;   EYE SURGERY Bilateral    Cataract Extraction with IOL   PACEMAKER INSERTION Left    [employer]tronic   TUMOR EXCISION     And nemamgeomas    Social History:  reports that she  has never smoked. She has never used smokeless tobacco. She reports that she does not drink alcohol and does not use drugs.  Family History:  Family History  Problem Relation Age of Onset   Other Mother        died @ 71 - old age.   Stroke Father        cva after cea in his 39's, died @ 22.   Kidney cancer Brother      Prior to Admission medications   Medication Sig Start Date End Date Taking? Authorizing Provider  amLODipine (NORVASC) 5 MG tablet TAKE 1 TABLET BY MOUTH EVERY MORNING 12/29/21  Yes Lesleigh Noe, MD  B Complex Vitamins (B COMPLEX PO) Take by mouth daily.   Yes [provider]  COLLAGEN PO Take by mouth. 1 scoop powder daily   Yes [provider]  diclofenac Sodium (VOLTAREN) 1 % GEL Apply 2 g topically 4 (four) times daily. 04/30/21  Yes [provider]  doxycycline (MONODOX) 100 MG capsule Take 1 capsule (100 mg total) by mouth 2 (two) times daily. Take with food and drink 05/19/21  Yes Brendolyn Patty, MD  MAGNESIUM PO Take by mouth daily.   Yes [provider]  mirtazapine (REMERON) 15 MG tablet Take 1 tablet (15 mg total) by mouth at bedtime as needed (sleep). 02/18/21  Yes Clapacs, Madie Reno, MD  Multiple Vitamin (MULTIVITAMIN) tablet Take 1 tablet by mouth daily.     Yes [provider]  Multiple Vitamins-Minerals (PRESERVISION AREDS) TABS Take 1 tablet by mouth in the morning and at bedtime.    Yes [provider]  mupirocin ointment (BACTROBAN) 2 % Apply 1 application topically daily. Qd to sore under left breast until healed 05/19/21  Yes Brendolyn Patty, MD  naproxen sodium (ALEVE) 220 MG tablet Take 220 mg by mouth daily as needed.   Yes [provider]  Polyethylene Glycol 3350 (MIRALAX PO) Take by mouth daily as needed.   Yes [provider]  potassium chloride (MICRO-K) 10 MEQ CR capsule TAKE 1 CAPSULE(10 MEQ) BY MOUTH DAILY 12/24/21  Yes Lesleigh Noe, MD  Red Yeast Rice 600 MG CAPS Take 1,200 mg  by mouth daily at 12 noon.   Yes [provider]  TURMERIC PO Take by mouth daily.   Yes [provider]    Physical Exam: Vitals:   03/23/22 1745 03/23/22 1830 03/23/22 1900 03/23/22 2015  BP: 114/60 (!) 121/56 (!) 121/53 (!) 148/62  Pulse: (!) 45 (!) 42 (!) 44 (!) 44  Resp: '19 15 14 16  '$ Temp:    98.6 F (37 C)  TempSrc:    Oral  SpO2: 99% 100% 97% 99%  Weight:      Height:       General: Not in acute distress HEENT:  Eyes: PERRL, EOMI, no scleral icterus.       ENT: No discharge from the ears and nose, no pharynx injection, no tonsillar enlargement.        Neck: No JVD, no bruit, no mass felt. Heme: No neck lymph node enlargement. Cardiac: S1/S2, RRR, No murmurs, No gallops or rubs. Respiratory: No rales, wheezing, rhonchi or rubs. GI: Soft, nondistended, nontender, no rebound pain, no organomegaly, BS present. GU: No hematuria Ext: No pitting leg edema bilaterally. 1+DP/PT pulse bilaterally. Musculoskeletal: No joint deformities, No joint redness or warmth, no limitation of ROM in spin. Skin: No rashes.  Neuro: Alert, oriented X3, cranial nerves II-XII grossly intact, moves all extremities normally. Psych: Patient has depressed mood.  No suicidal or hemocidal ideation.  Labs on Admission: I have personally reviewed following labs and imaging studies  CBC: Recent Labs  Lab 03/23/22 1227  WBC 5.1  NEUTROABS 3.0  HGB 11.8*  HCT 36.1  MCV 92.6  PLT 355   Basic Metabolic Panel: Recent Labs  Lab 03/23/22 1227  NA 144  K 3.4*  CL 111  CO2 25  GLUCOSE 106*  BUN 18  CREATININE 0.67  CALCIUM 9.0   GFR: Estimated Creatinine Clearance: 48.1 mL/min (by C-G formula based on SCr of 0.67 mg/dL). Liver Function Tests: Recent Labs  Lab 03/23/22 1227  AST 33  ALT 28  ALKPHOS 63  BILITOT 1.1  PROT 6.1*  ALBUMIN 3.4*   No results for input(s): "LIPASE", "AMYLASE" in the last 168 hours. No results for input(s): "AMMONIA" in the last 168  hours. Coagulation Profile: No results for input(s): "INR", "PROTIME" in the last 168 hours. Cardiac Enzymes: No results for input(s): "CKTOTAL", "CKMB", "CKMBINDEX", "TROPONINI" in the last 168 hours. BNP (last 3 results) No results for input(s): "PROBNP" in the last 8760 hours. HbA1C: No results for input(s): "HGBA1C" in the last 72 hours. CBG: No results for input(s): "GLUCAP" in the last 168 hours. Lipid Profile: No results for input(s): "CHOL", "HDL", "LDLCALC", "TRIG", "CHOLHDL", "LDLDIRECT" in the last 72 hours. Thyroid Function Tests: Recent Labs    03/23/22 1227  TSH 3.350   Anemia Panel: No results for input(s): "VITAMINB12", "FOLATE", "FERRITIN", "TIBC", "IRON", "RETICCTPCT" in the last 72 hours. Urine analysis:    Component Value Date/Time   COLORURINE YELLOW (A) 11/19/2015 1219   APPEARANCEUR CLOUDY (A) 11/19/2015 1219   LABSPEC 1.013 11/19/2015 1219   PHURINE 7.0 11/19/2015 1219   GLUCOSEU NEGATIVE 11/19/2015 1219   HGBUR NEGATIVE 11/19/2015 1219   BILIRUBINUR Negative 01/10/2021 1129   KETONESUR NEGATIVE 11/19/2015 1219   PROTEINUR Negative 01/10/2021 1129   PROTEINUR NEGATIVE 11/19/2015 1219   UROBILINOGEN 0.2 01/10/2021 1129   NITRITE Negative 01/10/2021 1129   NITRITE NEGATIVE 11/19/2015 1219   LEUKOCYTESUR Negative 01/10/2021 1129   Sepsis Labs: '@LABRCNTIP'$ (procalcitonin:4,lacticidven:4) )No results found for this or any previous visit (from the past 240 hour(s)).   Radiological Exams on Admission: No results found.    Assessment/Plan Principal Problem:   Bradycardia Active Problems:   PPM-Medtronic   Major depression   Essential hypertension   Chronic systolic CHF (congestive heart failure) (HCC)   CAD (coronary artery disease)   Assessment and Plan: * Bradycardia S/p of PPM-Medtronic. HR at 40s now. Pt is asymptomatic.  Blood pressure 156/78. TSH 3.35. Consulted cardiology, PA [employer]. Per his note, "Case has been discussed with EP  with recommendation for device company to reprogram the device".    -Please in PCU for observation -As needed  atropine 1 mg for symptomatic bradycardia -pacer at the bedside, Zoll -Telemetry monitoring -Follow-up cardiologist further recommendations -Avoid using nodal blockers  PPM-Medtronic -see above  Major depression S/p of ECT. Still feel depressed -continue Remeron   Essential hypertension - IV hydralazine as needed -Amlodipine  Chronic systolic CHF (congestive heart failure) ([place]) 2D echo 08/27/2020 showed EF of 45-50%.  Patient does not have leg edema JVD.  No shortness of breath.  CHF is compensated. -Check BNP -Watch volume status closely  CAD (coronary artery disease) No chest pain -Observe closely             DVT ppx:  SQ Lovenox  Code Status: Full code  Family Communication: not done, no family member is at bed side.     Disposition Plan:  Anticipate discharge back to previous environment  Consults called:  Card PA, Christell Faith  Admission status and Level of care: Progressive:    for obs     Severity of Illness:  The appropriate patient status for this patient is OBSERVATION. Observation status is judged to be reasonable and necessary in order to provide the required intensity of service to ensure the patient's safety. The patient's presenting symptoms, physical exam findings, and initial radiographic and laboratory data in the context of their medical condition is felt to place them at decreased risk for further clinical deterioration. Furthermore, it is anticipated that the patient will be medically stable for discharge from the hospital within 2 midnights of admission.        Date of Service 03/23/2022    Ivor Costa [facility]   If 7PM-7AM, please contact night-coverage [email or web address] 03/23/2022, 8:38 PM

## 2022-03-23 NOTE — Assessment & Plan Note (Signed)
Continue Norvasc

## 2022-03-23 NOTE — Consult Note (Signed)
Cardiology Consultation:   Patient ID: Barbara Marks; [id number]; [birth year]   Admit date: 03/23/2022 Date of Consult: 03/23/2022  Primary Care Provider: Lesleigh Noe, MD Primary Cardiologist: Barbara Marks Primary Electrophysiologist:  Barbara Marks (last evaluated in 2015   Patient Profile:   Barbara Marks is a 88 y.o. female with a hx of nonobstructive CAD by [facility] in 2004 in the [place], history of pacemaker implantation in 2006 with generator change out in 2011 possibly due to symptomatic bradycardia, frequent PVCs, HTN, HLD, anxiety, and depression requiring ECT treatment dating back to the 1980s who is being seen today for the evaluation of bradycardia at the request of Dr. Weber Marks.  History of Present Illness:   Barbara Marks underwent PPM implantation in 2006 in the [place] with documentation indicating for recurrent PVCs.  Notes indicate when she moved [place] the [place] that was noted she never needed a pacemaker for PVCs.  Most recently underwent ischemic evaluation in 09/2013 showed no evidence of ischemia with an EF of 41%.  She was last evaluated by EP in 07/2014 with normal device function at that time with recommendation to follow-up in 1 year.  From an EP perspective, she has been lost to follow-up since.  Subsequent documentation indicates the patient reporting her device has "been turned off."  She has previously requested for PPM to be explanted.  With regards to her PVC burden, she was previously on Rythmol, though self discontinued this.  Most recent echo from 08/2020 demonstrated an EF of 45 to 50%, indeterminate LV diastolic function parameters, normal RV systolic function and ventricular cavity size, degenerative mitral valve with mild to moderate regurgitation, and mild dilatation of the ascending aorta.  When compared to echo from 2014, EF had reduced from 50 to 55%.  She presented for scheduled ECT and tolerated without issues.  During treatment, it was  noted that she became bradycardic with a heart rate in the 40s bpm that was irregular.  Review of EKG shows she is V pacing at a rate of 40 bpm without atrial tracking.  Subsequent EKG shows AV paced rhythm at 85 bpm.  Device longevity estimated at 23 months.  Case has been discussed with EP with recommendation for device company to, reprogram the device.  She has been without symptoms of angina or decompensation.  She has noted some ongoing fatigue.   Past Medical History:  Diagnosis Date   Anxiety    CAD (coronary artery disease) Non-obstructive   a. 2004 Cath: nonobs dzs.   DDD (degenerative disc disease)    a. with chronic right sided back pain - improves after seeing chiropractor.   Depression    a. h/o ECT   Diverticulitis    Glaucoma    HTN (hypertension)    Hyperlipidemia, mixed    Melanoma of skin ([place]) 2008   resected from Left arm    Memory problem    "states memory issues"   Nephrolithiasis    Pancreatic cyst    a. [id number] Endoscopic U/S: nl UGI tract, 1-55m pancreatic cysts, no masses/nodules.   Premature ventricular contraction    a. managed with propafenone.   Presence of permanent cardiac pacemaker    Symptomatic bradycardia    a. s/p MDT PPM in 08/2005;  b. 02/2010 Gen change->MDT [place] DC PPM, ser # [id number].    Past Surgical History:  Procedure Laterality Date   APPENDECTOMY  1952   CATARACT EXTRACTION, BILATERAL     CHOLECYSTECTOMY  1983   EUS N/A 06/22/2013   Procedure: UPPER ENDOSCOPIC ULTRASOUND (EUS) LINEAR;  Surgeon: Barbara Banister, MD;  Location: [facility];  Service: Endoscopy;  Laterality: N/A;   EYE SURGERY Bilateral    Cataract Extraction with IOL   PACEMAKER INSERTION Left    [employer]   TUMOR EXCISION     And nemamgeomas     Home Meds: Prior to Admission medications   Medication Sig Start Date End Date Taking? Authorizing Provider  amLODipine (NORVASC) 5 MG tablet TAKE 1 TABLET BY MOUTH EVERY MORNING 12/29/21  Yes Barbara Noe, MD   B Complex Vitamins (B COMPLEX PO) Take by mouth daily.   Yes [provider]  COLLAGEN PO Take by mouth. 1 scoop powder daily   Yes [provider]  diclofenac Sodium (VOLTAREN) 1 % GEL Apply 2 g topically 4 (four) times daily. 04/30/21  Yes [provider]  doxycycline (MONODOX) 100 MG capsule Take 1 capsule (100 mg total) by mouth 2 (two) times daily. Take with food and drink 05/19/21  Yes Barbara Patty, MD  MAGNESIUM PO Take by mouth daily.   Yes [provider]  mirtazapine (REMERON) 15 MG tablet Take 1 tablet (15 mg total) by mouth at bedtime as needed (sleep). 02/18/21  Yes Marks, Barbara Reno, MD  Multiple Vitamin (MULTIVITAMIN) tablet Take 1 tablet by mouth daily.     Yes [provider]  Multiple Vitamins-Minerals (PRESERVISION AREDS) TABS Take 1 tablet by mouth in the morning and at bedtime.    Yes [provider]  mupirocin ointment (BACTROBAN) 2 % Apply 1 application topically daily. Qd to sore under left breast until healed 05/19/21  Yes Barbara Patty, MD  naproxen sodium (ALEVE) 220 MG tablet Take 220 mg by mouth daily as needed.   Yes [provider]  Polyethylene Glycol 3350 (MIRALAX PO) Take by mouth daily as needed.   Yes [provider]  potassium chloride (MICRO-K) 10 MEQ CR capsule TAKE 1 CAPSULE(10 MEQ) BY MOUTH DAILY 12/24/21  Yes Barbara Noe, MD  Red Yeast Rice 600 MG CAPS Take 1,200 mg by mouth daily at 12 noon.   Yes [provider]  TURMERIC PO Take by mouth daily.   Yes [provider]    Inpatient Medications: Scheduled Meds:  Continuous Infusions:  sodium chloride     PRN Meds: fentaNYL (SUBLIMAZE) injection  Allergies:   Allergies  Allergen Reactions   Codeine     unknown   Influenza Vaccines Cough, Hypertension and Nausea And Vomiting   Simvastatin Other (See Comments)    "leg cramps"   Statins Other (See Comments)    Pt states that she just can't handle Statin  medications     Social History:   Social History   Socioeconomic History   Marital status: Widowed    Spouse name: Not on file   Number of children: 3   Years of education: some college   Highest education level: Not on file  Occupational History   Occupation: Retired    Fish farm manager: RETIRED  Tobacco Use   Smoking status: Never   Smokeless tobacco: Never  Vaping Use   Vaping Use: Never used  Substance and Sexual Activity   Alcohol use: No   Drug use: No   Sexual activity: Not Currently  Other Topics Concern   Not on file  Social History Narrative   Lives in [place]. Lives with a friend (also 68 year - Paul).  3 Kids, 7 grandchildren, 1 great-grandchild   Enjoys: movies on Friday with friends, walks   Exercise: walking around the house for exercise   Diet: likes everything   Social Determinants of Health   Financial Resource Strain: Not on file  Food Insecurity: Not on file  Transportation Needs: Not on file  Physical Activity: Inactive (06/01/2018)   Exercise Vital Sign    Days of Exercise per Week: 0 days    Minutes of Exercise per Session: 0 min  Stress: Stress Concern Present (06/01/2018)   [place] of Stress : Very much  Social Connections: Somewhat Isolated (06/01/2018)   Social Connection and Isolation Panel [NHANES]    Frequency of Communication with Friends and Family: More than three times a week    Frequency of Social Gatherings with Friends and Family: More than three times a week    Attends Religious Services: More than 4 times per year    Active Member of [employer] or Organizations: No    Attends Archivist Meetings: Never    Marital Status: Widowed  Intimate Partner Violence: Not on file     Family History:   Family History  Problem Relation Age of Onset   Other Mother        died @ 47 - old age.   Stroke Father        cva after cea in his 24's, died @ 46.    Kidney cancer Brother     ROS:  Review of Systems  Constitutional:  Positive for malaise/fatigue. Negative for chills, diaphoresis, fever and weight loss.  HENT:  Negative for congestion.   Eyes:  Negative for discharge and redness.  Respiratory:  Negative for cough, sputum production, shortness of breath and wheezing.   Cardiovascular:  Negative for chest pain, palpitations, orthopnea, claudication, leg swelling and PND.  Gastrointestinal:  Negative for abdominal pain, heartburn, nausea and vomiting.  Musculoskeletal:  Negative for falls and myalgias.  Skin:  Negative for rash.  Neurological:  Positive for weakness. Negative for dizziness, tingling, tremors, sensory change, speech change, focal weakness and loss of consciousness.  Endo/Heme/Allergies:  Does not bruise/bleed easily.  Psychiatric/Behavioral:  Negative for substance abuse. The patient is not nervous/anxious.   All other systems reviewed and are negative.     Physical Exam/Data:   Vitals:   03/23/22 1330 03/23/22 1345 03/23/22 1400 03/23/22 1415  BP: (!) 164/84 (!) 147/84 (!) 156/78   Pulse: 87 85 85   Resp: '16 19 16 18  '$ Temp:      TempSrc:      SpO2: 95% 100% 99%   Weight:      Height:        Intake/Output Summary (Last 24 hours) at 03/23/2022 1506 Last data filed at 03/23/2022 1221 Gross per 24 hour  Intake 300 ml  Output --  Net 300 ml   Filed Weights   03/23/22 1125  Weight: 60.8 kg   Body mass index is 21.63 kg/m.   Physical Exam: General: Well developed, well nourished, in no acute distress. Head: Normocephalic, atraumatic, sclera non-icteric, no xanthomas, nares without discharge.  Neck: Negative for carotid bruits. JVD not elevated. Lungs: Clear bilaterally to auscultation without wheezes, rales, or rhonchi. Breathing is unlabored. Heart: Bradycardic with S1 S2. I/VI systolic murmur LSB, no rubs, or gallops appreciated. Abdomen: Soft, non-tender, non-distended with normoactive bowel sounds.  No hepatomegaly. No rebound/guarding. No obvious abdominal masses. Msk:  Strength and tone appear normal for age. Extremities: No clubbing or cyanosis. No edema. Distal pedal pulses are 2+ and equal bilaterally. Neuro: Alert and oriented X 3. No facial asymmetry. No focal deficit. Moves all extremities spontaneously. Psych:  Responds to questions appropriately with a normal affect.   EKG:  The EKG was personally reviewed and demonstrates: With magnet and place, AV paced, 85 bpm.  Without magnet in place, V paced with clear 9 tract P waves, 40 bpm Telemetry:  Telemetry was personally reviewed and demonstrates: V paced 40 bpm, clear P waves noted  Weights: Filed Weights   03/23/22 1125  Weight: 60.8 kg    Relevant CV Studies:  2D echo 08/27/2020: 1. Left ventricular ejection fraction, by estimation, is 45 to 50%. The  left ventricle has mildly decreased function. Left ventricular endocardial  border not optimally defined to evaluate regional wall motion. Left  ventricular diastolic parameters are  indeterminate.   2. Right ventricular systolic function is normal. The right ventricular  size is normal. Mildly increased right ventricular wall thickness.   3. The mitral valve is degenerative. Mild to moderate mitral valve  regurgitation. No evidence of mitral stenosis.   4. The aortic valve was not well visualized. Aortic valve regurgitation  is mild to moderate.   5. Aortic dilatation noted. There is mild dilatation of the ascending  aorta, measuring 38 mm.   6. The inferior vena cava is normal in size with greater than 50%  respiratory variability, suggesting right atrial pressure of 3 mmHg.  Laboratory Data:  Chemistry Recent Labs  Lab 03/23/22 1227  NA 144  K 3.4*  CL 111  CO2 25  GLUCOSE 106*  BUN 18  CREATININE 0.67  CALCIUM 9.0  GFRNONAA >60  ANIONGAP 8    Recent Labs  Lab 03/23/22 1227  PROT 6.1*  ALBUMIN 3.4*  AST 33  ALT 28  ALKPHOS 63  BILITOT 1.1    Hematology Recent Labs  Lab 03/23/22 1227  WBC 5.1  RBC 3.90  HGB 11.8*  HCT 36.1  MCV 92.6  MCH 30.3  MCHC 32.7  RDW 13.4  PLT 152   Cardiac EnzymesNo results for input(s): "TROPONINI" in the last 168 hours. No results for input(s): "TROPIPOC" in the last 168 hours.  BNPNo results for input(s): "BNP", "PROBNP" in the last 168 hours.  DDimer No results for input(s): "DDIMER" in the last 168 hours.  Radiology/Studies:  No results found.  Assessment and Plan:   1.  Symptomatic bradycardia: -Device interrogated which shows V pacing at 40 bpm with clear P waves present without atrial tracking -Hemodynamically stable and largely asymptomatic outside of longstanding fatigue -Recommend [employer] come out to interrogate and reprogram the device, and to ensure atrial lead is functional with further discussion with EP (Dr. Quentin Ore is aware) -She will need to be admitted and placed on telemetry until [employer] can reprogram her device, we have contacted [employer], due to scheduling, device reprogramming will occur on 6/20 -Dr. Quentin Ore recommends [employer] contact him after device reprogramming   2.  PVCs: -She has previously declined medical therapy -We may be able to initiate beta-blocker therapy moving forward given underlying PPM in place that is not turned off and functional -PVCs may also be contributing to her systolic dysfunction  3.  Systolic dysfunction: -Prior echo from 08/2020 with an EF of 45-50%, possibly exacerbated by PVC  -Outpatient echo is pending  4.  Hypokalemia: -Replete to goal 4.0     For questions  or updates, please contact [place] Please consult [email or web address] for contact info under Cardiology/STEMI.   Signed, Christell Faith, PA-C [facility] Pager: [phone] 03/23/2022, 3:06 PM

## 2022-03-23 NOTE — Assessment & Plan Note (Signed)
-  see above 

## 2022-03-24 ENCOUNTER — Telehealth: Payer: Self-pay

## 2022-03-24 ENCOUNTER — Telehealth: Payer: Self-pay | Admitting: Cardiology

## 2022-03-24 ENCOUNTER — Other Ambulatory Visit: Payer: Self-pay | Admitting: Psychiatry

## 2022-03-24 DIAGNOSIS — R001 Bradycardia, unspecified: Secondary | ICD-10-CM | POA: Diagnosis not present

## 2022-03-24 DIAGNOSIS — Z950 Presence of cardiac pacemaker: Secondary | ICD-10-CM | POA: Diagnosis not present

## 2022-03-24 DIAGNOSIS — I10 Essential (primary) hypertension: Secondary | ICD-10-CM | POA: Diagnosis not present

## 2022-03-24 LAB — BASIC METABOLIC PANEL
Anion gap: 4 — ABNORMAL LOW (ref 5–15)
BUN: 16 mg/dL (ref 8–23)
CO2: 27 mmol/L (ref 22–32)
Calcium: 8.9 mg/dL (ref 8.9–10.3)
Chloride: 113 mmol/L — ABNORMAL HIGH (ref 98–111)
Creatinine, Ser: 0.6 mg/dL (ref 0.44–1.00)
GFR, Estimated: >60 mL/min (ref >60)
Glucose, Bld: 89 mg/dL (ref 70–99)
Potassium: 3.7 mmol/L (ref 3.5–5.1)
Sodium: 144 mmol/L (ref 135–145)

## 2022-03-24 NOTE — Telephone Encounter (Signed)
You have to send to Dr. Weber Cooks in a chat. He doesn't check his messages or emails

## 2022-03-24 NOTE — Progress Notes (Signed)
  Transition of Care [facility]) Screening Note   Patient Details  Name: NEVEEN DAPONTE Date of Birth: 09-15-1934   Transition of Care [facility]) CM/SW Contact:    Alberteen Sam, LCSW Phone Number: 03/24/2022, 1:45 PM    Transition of Care Department [facility]) has reviewed patient and no TOC needs have been identified at this time. We will continue to monitor patient advancement through interdisciplinary progression rounds. If new patient transition needs arise, please place a TOC consult.  [place], [place]

## 2022-03-24 NOTE — Progress Notes (Signed)
PROGRESS NOTE   HPI was taken from Dr. Blaine Hamper: Barbara Marks is a 88 y.o. female with medical history significant of hypertension, depression with anxiety, pacemaker placement, PVC, CAD, diverticulitis, memory loss, CAD, sCHF with EF of 45-50%, who is admitted due to bradycardia.   Patient is initially here for scheduled ECT treatment due tp depression by Dr. Weber Cooks. After she had ECT procedure, she was was found to have bradycardia with heart rate down to 39, currently HR is at lower 40s.  Patient denies chest pain, shortness breath, lightheadedness or dizziness.  She denies nausea, vomiting, diarrhea or abdominal pain.  No symptoms of UTI.  Patient still feels depressed, but denies suicidal or homicidal ideations.   Of note, doxycycline is on her medication list, which was started for left lat Inframammary epidermal cyst on 05/19/21, but pt states that she is not taking this medication or any antibiotics now,   Data Reviewed and ED Course: pt was found to have WBC 5.1, potassium 3.4, GFR> 60, temperature normal, blood pressure 156/78, RR 22, oxygen saturation 95% on room air.  Patient is placed on PCU for observation. Christell Faith of Card PA is consulted.    EKG: I have personally reviewed.  V pacing at heart rate of 40 bpm without atrial tracking. QTc 564   CAROLY PUREWAL  JME:[id number] DOB: 1934-08-24 DOA: 03/23/2022 PCP: Lesleigh Noe, MD   Assessment & Plan:   Principal Problem:   Bradycardia Active Problems:   PPM-[employer]   Major depression   Essential hypertension   Chronic systolic CHF (congestive heart failure) (HCC)   CAD (coronary artery disease)  Assessment and Plan: Bradycardia: s/p pacemaker placement. Asymptomatic. Atropine prn for symptomatic bradycardia. Continue on tele. Cardio following and recs apprec   Pacemaker present: [employer]. Needs to be interrogated & reprogrammed by [employer] as per cardio. After this done further evaluation & discussion w/ EP is  recommended, Dr. Quentin Ore is aware  Hx of PVCs: continue on tele. Management per cardio    Major depression: s/p ECT. Continue on remeron. Psych following and recs apprec     HTN: continue on amlodipine. IV hydralazine    Chronic systolic CHF: echo 22/97/9892 showed EF of 45-50%. CHF is compensated. Monitor I/Os    Hx of CAD: no chest pain. Continue on amlodipine.   Hypokalemia: WNL today     DVT prophylaxis: lovenox  Code Status: full  Family Communication:  Disposition Plan: likely d/c back home   Level of care: Progressive  Status is: Observation The patient remains OBS appropriate and will d/c before 2 midnights.    Consultants:  Cardio   Procedures:   Antimicrobials:    Subjective: Pt c/o fatigue   Objective: Vitals:   03/23/22 2357 03/24/22 0300 03/24/22 0443 03/24/22 0736  BP: 126/63  (!) 147/53 (!) 144/59  Pulse: 98  (!) 41 (!) 40  Resp: '18  18 18  '$ Temp: 98.5 F (36.9 C)  98.9 F (37.2 C) 98.9 F (37.2 C)  TempSrc:      SpO2: 98%  98% 97%  Weight:  62.5 kg    Height:        Intake/Output Summary (Last 24 hours) at 03/24/2022 0743 Last data filed at 03/23/2022 2002 Gross per 24 hour  Intake 540 ml  Output --  Net 540 ml   Filed Weights   03/23/22 1125 03/24/22 0300  Weight: 60.8 kg 62.5 kg    Examination:  General exam: Appears calm and comfortable  Respiratory system: Clear to auscultation. Respiratory effort normal. Cardiovascular system: S1 & S2 +. No rubs, gallops or clicks.  Gastrointestinal system: Abdomen is nondistended, soft and nontender.  Normal bowel sounds heard. Central nervous system: Alert and oriented. Moves all extremities  Psychiatry: Judgement and insight appear normal. Flat mood and affect      Data Reviewed: I have personally reviewed following labs and imaging studies  CBC: Recent Labs  Lab 03/23/22 1227  WBC 5.1  NEUTROABS 3.0  HGB 11.8*  HCT 36.1  MCV 92.6  PLT 326   Basic Metabolic Panel: Recent  Labs  Lab 03/23/22 1227 03/24/22 0623  NA 144 144  K 3.4* 3.7  CL 111 113*  CO2 25 27  GLUCOSE 106* 89  BUN 18 16  CREATININE 0.67 0.60  CALCIUM 9.0 8.9   GFR: Estimated Creatinine Clearance: 48.1 mL/min (by C-G formula based on SCr of 0.6 mg/dL). Liver Function Tests: Recent Labs  Lab 03/23/22 1227  AST 33  ALT 28  ALKPHOS 63  BILITOT 1.1  PROT 6.1*  ALBUMIN 3.4*   No results for input(s): "LIPASE", "AMYLASE" in the last 168 hours. No results for input(s): "AMMONIA" in the last 168 hours. Coagulation Profile: No results for input(s): "INR", "PROTIME" in the last 168 hours. Cardiac Enzymes: No results for input(s): "CKTOTAL", "CKMB", "CKMBINDEX", "TROPONINI" in the last 168 hours. BNP (last 3 results) No results for input(s): "PROBNP" in the last 8760 hours. HbA1C: No results for input(s): "HGBA1C" in the last 72 hours. CBG: No results for input(s): "GLUCAP" in the last 168 hours. Lipid Profile: No results for input(s): "CHOL", "HDL", "LDLCALC", "TRIG", "CHOLHDL", "LDLDIRECT" in the last 72 hours. Thyroid Function Tests: Recent Labs    03/23/22 1227  TSH 3.350   Anemia Panel: No results for input(s): "VITAMINB12", "FOLATE", "FERRITIN", "TIBC", "IRON", "RETICCTPCT" in the last 72 hours. Sepsis Labs: No results for input(s): "PROCALCITON", "LATICACIDVEN" in the last 168 hours.  No results found for this or any previous visit (from the past 240 hour(s)).       Radiology Studies: No results found.      Scheduled Meds:  amLODipine  5 mg Oral q morning   B-complex with vitamin C  1 tablet Oral Daily   diclofenac Sodium  2 g Topical QID   enoxaparin (LOVENOX) injection  40 mg Subcutaneous Q24H   magnesium  84 mg Oral Daily   multivitamin with minerals  1 tablet Oral Daily   mupirocin ointment  1 Application Topical Daily   Continuous Infusions:  sodium chloride       LOS: 0 days    Time spent: 35 mins     Wyvonnia Dusky, MD [facility] Pager [phone]  If 7PM-7AM, please contact night-coverage [email or web address] 03/24/2022, 7:43 AM

## 2022-03-24 NOTE — Telephone Encounter (Signed)
Pt is in hospital and is requesting a call back. She was told someone would be coming by and she would like to know a specific time.

## 2022-03-24 NOTE — Anesthesia Postprocedure Evaluation (Signed)
Anesthesia Post Note  Patient: Barbara Marks  Procedure(s) Performed: PULSE OXIMETRY (SINGLE)  Patient location during evaluation: PACU Anesthesia Type: General Level of consciousness: awake and alert Pain management: pain level controlled Vital Signs Assessment: post-procedure vital signs reviewed and stable Respiratory status: spontaneous breathing, nonlabored ventilation, respiratory function stable and patient connected to nasal cannula oxygen Cardiovascular status: blood pressure returned to baseline and stable Postop Assessment: no apparent nausea or vomiting Anesthetic complications: no   No notable events documented.   Last Vitals:  Vitals:   03/24/22 0443 03/24/22 0736  BP: (!) 147/53 (!) 144/59  Pulse: (!) 41 (!) 40  Resp: 18 18  Temp: 37.2 C 37.2 C  SpO2: 98% 97%    Last Pain:  Vitals:   03/23/22 2100  TempSrc:   PainSc: 0-No pain                 Molli Barrows

## 2022-03-24 NOTE — Progress Notes (Signed)
Progress Note  Patient Name: Barbara Marks Date of Encounter: 03/24/2022  [facility] Cardiologist: Kate Sable, MD   Subjective   Patient seen on AM rounds. Denies any chest pain, shortness of breath, dizziness or lightheadedness. She states that she is ready to return home.   Inpatient Medications    Scheduled Meds:  amLODipine  5 mg Oral q morning   B-complex with vitamin C  1 tablet Oral Daily   diclofenac Sodium  2 g Topical QID   enoxaparin (LOVENOX) injection  40 mg Subcutaneous Q24H   magnesium  84 mg Oral Daily   multivitamin with minerals  1 tablet Oral Daily   mupirocin ointment  1 Application Topical Daily   Continuous Infusions:  sodium chloride     PRN Meds: acetaminophen, atropine, diphenhydrAMINE, fentaNYL (SUBLIMAZE) injection, hydrALAZINE, melatonin, mirtazapine, naproxen, polyethylene glycol   Vital Signs    Vitals:   03/23/22 2357 03/24/22 0300 03/24/22 0443 03/24/22 0736  BP: 126/63  (!) 147/53 (!) 144/59  Pulse: 98  (!) 41 (!) 40  Resp: '18  18 18  '$ Temp: 98.5 F (36.9 C)  98.9 F (37.2 C) 98.9 F (37.2 C)  TempSrc:      SpO2: 98%  98% 97%  Weight:  62.5 kg    Height:        Intake/Output Summary (Last 24 hours) at 03/24/2022 1102 Last data filed at 03/24/2022 0900 Gross per 24 hour  Intake 780 ml  Output --  Net 780 ml      03/24/2022    3:00 AM 03/23/2022   11:25 AM 11/19/2021   12:00 PM  Last 3 Weights  Weight (lbs) 137 lb 12.6 oz 134 lb 137 lb 9 oz  Weight (kg) 62.5 kg 60.782 kg 62.398 kg      Telemetry    SB rate in the 40's, V-paced- Personally Reviewed  ECG    No new tracings- Personally Reviewed  Physical Exam   GEN: No acute distress. Sitting up in bed.  Neck: No JVD appreciated Cardiac: RRR, bradycardiac, I/VI systolic murmur best heard at the LSB, without rubs, or gallops.  Respiratory: Clear to auscultation bilaterally. Respirations are unlabored on room air GI: Soft, nontender, non-distended, bowel  sounds are present in all 4 quadrants MS: No edema; No deformity. Neuro:  Nonfocal  Psych: Normal affect   Labs    High Sensitivity Troponin:  No results for input(s): "TROPONINIHS" in the last 720 hours.   Chemistry Recent Labs  Lab 03/23/22 1227 03/24/22 0623  NA 144 144  K 3.4* 3.7  CL 111 113*  CO2 25 27  GLUCOSE 106* 89  BUN 18 16  CREATININE 0.67 0.60  CALCIUM 9.0 8.9  PROT 6.1*  --   ALBUMIN 3.4*  --   AST 33  --   ALT 28  --   ALKPHOS 63  --   BILITOT 1.1  --   GFRNONAA >60 >60  ANIONGAP 8 4*    Lipids No results for input(s): "CHOL", "TRIG", "HDL", "LABVLDL", "LDLCALC", "CHOLHDL" in the last 168 hours.  Hematology Recent Labs  Lab 03/23/22 1227  WBC 5.1  RBC 3.90  HGB 11.8*  HCT 36.1  MCV 92.6  MCH 30.3  MCHC 32.7  RDW 13.4  PLT 152   Thyroid  Recent Labs  Lab 03/23/22 1227  TSH 3.350    BNP Recent Labs  Lab 03/23/22 1227  BNP 147.4*    DDimer No results for input(s): "DDIMER"  in the last 168 hours.   Radiology    No results found.  Cardiac Studies  Echocardiogram completed 08/27/2020 1. Left ventricular ejection fraction, by estimation, is 45 to 50%. The  left ventricle has mildly decreased function. Left ventricular endocardial border not optimally defined to evaluate regional wall motion. Left ventricular diastolic parameters are indeterminate.   2. Right ventricular systolic function is normal. The right ventricular  size is normal. Mildly increased right ventricular wall thickness.   3. The mitral valve is degenerative. Mild to moderate mitral valve  regurgitation. No evidence of mitral stenosis.   4. The aortic valve was not well visualized. Aortic valve regurgitation is mild to moderate.   5. Aortic dilatation noted. There is mild dilatation of the ascending  aorta, measuring 38 mm.   6. The inferior vena cava is normal in size with greater than 50%  respiratory variability, suggesting right atrial pressure of 3 mmHg.    Patient Profile     88 y.o. female with a history of nonobstructive CAD by left heart cath in 2004 in the [place], history of pacemaker implementation in 2006 with generator change out in 2011 possibly due to symptomatic bradycardia, frequent PVCs, essential hypertension, hyperlipidemia, anxiety, and depression requiring ECT treatment dating back to the 1980s who is being seen for the evaluation of bradycardia.  Assessment & Plan    Symptomatic bradycardia -Device interrogation while here shows she is with atrial tracking -Vital signs remained stable and is largely asymptomatic outside of fatigue and some occasional lightheadedness related to bradycardia -Awaiting [employer] representative to reprogram and interrogate device to ensure that atrial lead is functional and not fractured -After interrogation is completed further evaluation and discussion with EP is recommended Dr. Quentin Ore is aware of the patient and current situation -hands free pacing pads remain on patient  2.  Premature ventricular contractions -Longstanding history of PVCs -Stated previously she was told that was the reason that she needed to have her pacemaker was that she had the PVCs -She has previously declined any medical therapy  3.  Hypokalemia -Potassium level today 3.7 -Recommend keeping potassium level closer to 4 -Daily BMP  4.  Essential hypertension -Blood pressure has been better controlled -Continue amlodipine 5 mg daily -Continue as needed hydralazine -Vital signs per unit protocol -Monitor/trend/replete as needed      For questions or updates, please contact [facility] Please consult [email or web address] for contact info under        Signed, Nelly Scriven, NP  03/24/2022, 11:02 AM

## 2022-03-24 NOTE — Progress Notes (Signed)
Pacemaker Interrogation done. Confirmation received from [place]. Lower rate set at 40. Wires working, paces 8% of the time and 82% on her own. Battery estimated  longevity of 19 months based on 100% pacing. Pacemaker implanted on Feb 24, 2010.

## 2022-03-24 NOTE — Telephone Encounter (Signed)
Pt called to schedule ECT treatment for Friday, June 23rd. She is currently hospitalized from the last treatment.

## 2022-03-25 ENCOUNTER — Ambulatory Visit: Payer: PPO | Attending: Psychiatry

## 2022-03-25 ENCOUNTER — Encounter: Payer: Self-pay | Admitting: Physician Assistant

## 2022-03-25 ENCOUNTER — Telehealth: Payer: Self-pay | Admitting: *Deleted

## 2022-03-25 DIAGNOSIS — Z823 Family history of stroke: Secondary | ICD-10-CM | POA: Diagnosis not present

## 2022-03-25 DIAGNOSIS — F419 Anxiety disorder, unspecified: Secondary | ICD-10-CM | POA: Diagnosis present

## 2022-03-25 DIAGNOSIS — I2510 Atherosclerotic heart disease of native coronary artery without angina pectoris: Secondary | ICD-10-CM | POA: Diagnosis present

## 2022-03-25 DIAGNOSIS — Z950 Presence of cardiac pacemaker: Secondary | ICD-10-CM | POA: Diagnosis not present

## 2022-03-25 DIAGNOSIS — E782 Mixed hyperlipidemia: Secondary | ICD-10-CM | POA: Diagnosis present

## 2022-03-25 DIAGNOSIS — Z888 Allergy status to other drugs, medicaments and biological substances status: Secondary | ICD-10-CM | POA: Diagnosis not present

## 2022-03-25 DIAGNOSIS — H409 Unspecified glaucoma: Secondary | ICD-10-CM | POA: Diagnosis present

## 2022-03-25 DIAGNOSIS — G9341 Metabolic encephalopathy: Secondary | ICD-10-CM

## 2022-03-25 DIAGNOSIS — Z9841 Cataract extraction status, right eye: Secondary | ICD-10-CM | POA: Diagnosis not present

## 2022-03-25 DIAGNOSIS — Z79899 Other long term (current) drug therapy: Secondary | ICD-10-CM | POA: Diagnosis not present

## 2022-03-25 DIAGNOSIS — Z8051 Family history of malignant neoplasm of kidney: Secondary | ICD-10-CM | POA: Diagnosis not present

## 2022-03-25 DIAGNOSIS — I10 Essential (primary) hypertension: Secondary | ICD-10-CM | POA: Diagnosis present

## 2022-03-25 DIAGNOSIS — F332 Major depressive disorder, recurrent severe without psychotic features: Secondary | ICD-10-CM | POA: Diagnosis present

## 2022-03-25 DIAGNOSIS — E785 Hyperlipidemia, unspecified: Secondary | ICD-10-CM | POA: Diagnosis not present

## 2022-03-25 DIAGNOSIS — I5022 Chronic systolic (congestive) heart failure: Secondary | ICD-10-CM | POA: Diagnosis not present

## 2022-03-25 DIAGNOSIS — Z9842 Cataract extraction status, left eye: Secondary | ICD-10-CM | POA: Diagnosis not present

## 2022-03-25 DIAGNOSIS — Z885 Allergy status to narcotic agent status: Secondary | ICD-10-CM | POA: Diagnosis not present

## 2022-03-25 DIAGNOSIS — E876 Hypokalemia: Secondary | ICD-10-CM | POA: Diagnosis present

## 2022-03-25 DIAGNOSIS — Z961 Presence of intraocular lens: Secondary | ICD-10-CM | POA: Diagnosis present

## 2022-03-25 DIAGNOSIS — Z85820 Personal history of malignant melanoma of skin: Secondary | ICD-10-CM | POA: Diagnosis not present

## 2022-03-25 DIAGNOSIS — R001 Bradycardia, unspecified: Secondary | ICD-10-CM | POA: Diagnosis present

## 2022-03-25 LAB — CBC
HCT: 32.6 % — ABNORMAL LOW (ref 36.0–46.0)
Hemoglobin: 10.7 g/dL — ABNORMAL LOW (ref 12.0–15.0)
MCH: 30.1 pg (ref 26.0–34.0)
MCHC: 32.8 g/dL (ref 30.0–36.0)
MCV: 91.8 fL (ref 80.0–100.0)
Platelets: 154 10*3/uL (ref 150–400)
RBC: 3.55 MIL/uL — ABNORMAL LOW (ref 3.87–5.11)
RDW: 13.2 % (ref 11.5–15.5)
WBC: 5.1 10*3/uL (ref 4.0–10.5)
nRBC: 0 % (ref 0.0–0.2)

## 2022-03-25 LAB — BASIC METABOLIC PANEL
Anion gap: 5 (ref 5–15)
BUN: 20 mg/dL (ref 8–23)
CO2: 26 mmol/L (ref 22–32)
Calcium: 9.2 mg/dL (ref 8.9–10.3)
Chloride: 110 mmol/L (ref 98–111)
Creatinine, Ser: 0.57 mg/dL (ref 0.44–1.00)
GFR, Estimated: >60 mL/min (ref >60)
Glucose, Bld: 95 mg/dL (ref 70–99)
Potassium: 3.1 mmol/L — ABNORMAL LOW (ref 3.5–5.1)
Sodium: 141 mmol/L (ref 135–145)

## 2022-03-25 LAB — PACEMAKER DEVICE OBSERVATION

## 2022-03-25 MED ORDER — AMLODIPINE BESYLATE 5 MG PO TABS
5.0000 mg | ORAL_TABLET | Freq: Two times a day (BID) | ORAL | Status: DC
Start: 2022-03-25 — End: 2022-03-26
  Administered 2022-03-25 – 2022-03-26 (×2): 5 mg via ORAL
  Filled 2022-03-25 (×2): qty 1

## 2022-03-25 MED ORDER — SUCCINYLCHOLINE CHLORIDE 200 MG/10ML IV SOSY
PREFILLED_SYRINGE | INTRAVENOUS | Status: DC | PRN
  Administered 2022-03-25: 90 mg via INTRAVENOUS

## 2022-03-25 MED ORDER — PROPOFOL 10 MG/ML IV BOLUS
INTRAVENOUS | Status: DC | PRN
  Administered 2022-03-25: 20 mg via INTRAVENOUS

## 2022-03-25 MED ORDER — PROPOFOL 10 MG/ML IV BOLUS
INTRAVENOUS | Status: AC
  Filled 2022-03-25: qty 20

## 2022-03-25 MED ORDER — SUCCINYLCHOLINE CHLORIDE 200 MG/10ML IV SOSY
PREFILLED_SYRINGE | INTRAVENOUS | Status: AC
  Filled 2022-03-25: qty 10

## 2022-03-25 MED ORDER — KETAMINE HCL 10 MG/ML IJ SOLN
INTRAMUSCULAR | Status: DC | PRN
  Administered 2022-03-25: 50 mg via INTRAVENOUS

## 2022-03-25 MED ORDER — POTASSIUM CHLORIDE CRYS ER 20 MEQ PO TBCR
40.0000 meq | EXTENDED_RELEASE_TABLET | Freq: Once | ORAL | Status: AC
Start: 2022-03-25 — End: 2022-03-25
  Administered 2022-03-25: 40 meq via ORAL
  Filled 2022-03-25: qty 2

## 2022-03-25 MED ORDER — KETAMINE HCL 50 MG/5ML IJ SOSY
PREFILLED_SYRINGE | INTRAMUSCULAR | Status: AC
  Filled 2022-03-25: qty 5

## 2022-03-25 MED ORDER — SODIUM CHLORIDE 0.9 % IV SOLN
500.0000 mL | Freq: Once | INTRAVENOUS | Status: AC
  Administered 2022-03-25: 500 mL via INTRAVENOUS

## 2022-03-25 NOTE — H&P (Signed)
Barbara Marks is an 88 y.o. female.   Chief Complaint: Patient was grumpy before ECT today.  She was grumpy that she had to wait so long for treatment.  She did get her pacemaker reprogram so that is much safer than it was before.  Still feeling a little depressed but seems more energetic HPI: Recurrent severe depression with good response to ECT  Past Medical History:  Diagnosis Date   Anxiety    CAD (coronary artery disease) Non-obstructive   a. 2004 Cath: nonobs dzs.   Chronic systolic CHF (congestive heart failure) (HCC)    DDD (degenerative disc disease)    a. with chronic right sided back pain - improves after seeing chiropractor.   Depression    a. h/o ECT   Diverticulitis    Glaucoma    HTN (hypertension)    Hyperlipidemia, mixed    Melanoma of skin ([place]) 2008   resected from Left arm    Memory problem    "states memory issues"   Nephrolithiasis    Pancreatic cyst    a. 8[address]29 Endoscopic U/S: nl UGI tract, 1-37m pancreatic cysts, no masses/nodules.   Premature ventricular contraction    a. managed with propafenone.   Presence of permanent cardiac pacemaker    Symptomatic bradycardia    a. s/p MDT PPM in 08/2005;  b. 02/2010 Gen change->MDT [place] DC PPM, ser # [id number].    Past Surgical History:  Procedure Laterality Date   APPENDECTOMY  1952   CATARACT EXTRACTION, BILATERAL     CHOLECYSTECTOMY  1983   EUS N/A 06/22/2013   Procedure: UPPER ENDOSCOPIC ULTRASOUND (EUS) LINEAR;  Surgeon: DMilus Banister MD;  Location: [facility];  Service: Endoscopy;  Laterality: N/A;   EYE SURGERY Bilateral    Cataract Extraction with IOL   PACEMAKER INSERTION Left    [employer]   TUMOR EXCISION     And nemamgeomas    Family History  Problem Relation Age of Onset   Other Mother        died @ 937- old age.   Stroke Father        cva after cea in his 552's died @ 756   Kidney cancer Brother    Social History:  reports that she has never smoked. She has never used  smokeless tobacco. She reports that she does not drink alcohol and does not use drugs.  Allergies:  Allergies  Allergen Reactions   Codeine     unknown   Influenza Vaccines Cough, Hypertension and Nausea And Vomiting   Simvastatin Other (See Comments)    "leg cramps"   Statins Other (See Comments)    Pt states that she just can't handle Statin medications     Medications Prior to Admission  Medication Sig Dispense Refill   amLODipine (NORVASC) 5 MG tablet TAKE 1 TABLET BY MOUTH EVERY MORNING 90 tablet 3   B Complex Vitamins (B COMPLEX PO) Take 1 tablet by mouth daily.     Multiple Vitamin (MULTIVITAMIN) tablet Take 1 tablet by mouth daily.       naproxen sodium (ALEVE) 220 MG tablet Take 220 mg by mouth daily as needed.     Polyethylene Glycol 3350 (MIRALAX PO) Take by mouth daily as needed.     potassium chloride (MICRO-K) 10 MEQ CR capsule TAKE 1 CAPSULE(10 MEQ) BY MOUTH DAILY 90 capsule 1   Red Yeast Rice 600 MG CAPS Take 1,200 mg by mouth daily at 12 noon.  TURMERIC PO Take 1 tablet by mouth daily.     COLLAGEN PO Take by mouth. 1 scoop powder daily (Patient not taking: Reported on 03/24/2022)     diclofenac Sodium (VOLTAREN) 1 % GEL Apply 2 g topically 4 (four) times daily. (Patient not taking: Reported on 03/24/2022)     doxycycline (MONODOX) 100 MG capsule Take 1 capsule (100 mg total) by mouth 2 (two) times daily. Take with food and drink (Patient not taking: Reported on 03/24/2022) 25 capsule 0   MAGNESIUM PO Take by mouth daily. (Patient not taking: Reported on 03/24/2022)     mirtazapine (REMERON) 15 MG tablet Take 1 tablet (15 mg total) by mouth at bedtime as needed (sleep). (Patient not taking: Reported on 03/24/2022) 30 tablet 4   Multiple Vitamins-Minerals (PRESERVISION AREDS) TABS Take 1 tablet by mouth in the morning and at bedtime.  (Patient not taking: Reported on 03/24/2022)     mupirocin ointment (BACTROBAN) 2 % Apply 1 application topically daily. Qd to sore under  left breast until healed (Patient not taking: Reported on 03/24/2022) 22 g 1    Results for orders placed or performed during the hospital encounter of 03/23/22 (from the past 48 hour(s))  Basic metabolic panel     Status: Abnormal   Collection Time: 03/24/22  6:23 AM  Result Value Ref Range   Sodium 144 135 - 145 mmol/L   Potassium 3.7 3.5 - 5.1 mmol/L   Chloride 113 (H) 98 - 111 mmol/L   CO2 27 22 - 32 mmol/L   Glucose, Bld 89 70 - 99 mg/dL    Comment: Glucose reference range applies only to samples taken after fasting for at least 8 hours.   BUN 16 8 - 23 mg/dL   Creatinine, Ser 0.60 0.44 - 1.00 mg/dL   Calcium 8.9 8.9 - 10.3 mg/dL   GFR, Estimated >60 >60 mL/min    Comment: (NOTE) Calculated using the CKD-EPI Creatinine Equation (2021)    Anion gap 4 (L) 5 - 15    Comment: Performed at [facility], [address] [place], [place] [address]  CBC     Status: Abnormal   Collection Time: 03/25/22  6:35 AM  Result Value Ref Range   WBC 5.1 4.0 - 10.5 K/uL   RBC 3.55 (L) 3.87 - 5.11 MIL/uL   Hemoglobin 10.7 (L) 12.0 - 15.0 g/dL   HCT 32.6 (L) 36.0 - 46.0 %   MCV 91.8 80.0 - 100.0 fL   MCH 30.1 26.0 - 34.0 pg   MCHC 32.8 30.0 - 36.0 g/dL   RDW 13.2 11.5 - 15.5 %   Platelets 154 150 - 400 K/uL   nRBC 0.0 0.0 - 0.2 %    Comment: Performed at [facility], [address] [place], [place] [address]  Basic metabolic panel     Status: Abnormal   Collection Time: 03/25/22  6:35 AM  Result Value Ref Range   Sodium 141 135 - 145 mmol/L   Potassium 3.1 (L) 3.5 - 5.1 mmol/L   Chloride 110 98 - 111 mmol/L   CO2 26 22 - 32 mmol/L   Glucose, Bld 95 70 - 99 mg/dL    Comment: Glucose reference range applies only to samples taken after fasting for at least 8 hours.   BUN 20 8 - 23 mg/dL   Creatinine, Ser 0.57 0.44 - 1.00 mg/dL   Calcium 9.2 8.9 - 10.3 mg/dL   GFR, Estimated >60 >60 mL/min  Comment: (NOTE) Calculated using the CKD-EPI Creatinine Equation (2021)     Anion gap 5 5 - 15    Comment: Performed at [facility], [address] [place], [place] [address]   No results found.  Review of Systems  Constitutional: Negative.   HENT: Negative.    Eyes: Negative.   Respiratory: Negative.    Cardiovascular: Negative.   Gastrointestinal: Negative.   Musculoskeletal: Negative.   Skin: Negative.   Neurological: Negative.   Psychiatric/Behavioral:  Positive for confusion and dysphoric mood.     Blood pressure (!) 149/75, pulse 74, temperature 98.4 F (36.9 C), resp. rate 17, height '5\' 6"'$  (1.676 m), weight 62 kg, SpO2 100 %. Physical Exam Constitutional:      Appearance: She is well-developed.  HENT:     Head: Normocephalic and atraumatic.  Eyes:     Conjunctiva/sclera: Conjunctivae normal.     Pupils: Pupils are equal, round, and reactive to light.  Cardiovascular:     Heart sounds: Normal heart sounds.  Pulmonary:     Effort: Pulmonary effort is normal.  Abdominal:     Palpations: Abdomen is soft.  Musculoskeletal:        General: Normal range of motion.     Cervical back: Normal range of motion.  Skin:    General: Skin is warm and dry.  Neurological:     Mental Status: She is alert.  Psychiatric:        Attention and Perception: Attention normal.        Mood and Affect: Mood is anxious. Affect is angry.        Speech: Speech normal.      Assessment/Plan ECT today and would like to do another treatment if possible on Friday after which based on her previous history she will probably have improved enough  [name], MD 03/25/2022, 4:57 PM

## 2022-03-25 NOTE — Progress Notes (Signed)
Progress Note  Patient Name: Barbara Marks Date of Encounter: 03/25/2022  [facility] Cardiologist: Kate Sable, MD   Subjective   Patient was seen this morning on rounds. Denies chest pain, shortness of breath, dizziness, and lightheadedness.  She was oriented to person and place but not situation.  She did not remember that she had a pacemaker but knew she was scheduled for ECT today.  Inpatient Medications    Scheduled Meds:  amLODipine  5 mg Oral q morning   B-complex with vitamin C  1 tablet Oral Daily   diclofenac Sodium  2 g Topical QID   enoxaparin (LOVENOX) injection  40 mg Subcutaneous Q24H   magnesium  84 mg Oral Daily   multivitamin with minerals  1 tablet Oral Daily   mupirocin ointment  1 Application Topical Daily   Continuous Infusions:  sodium chloride     PRN Meds: acetaminophen, atropine, diphenhydrAMINE, fentaNYL (SUBLIMAZE) injection, hydrALAZINE, melatonin, mirtazapine, naproxen, polyethylene glycol   Vital Signs    Vitals:   03/24/22 2004 03/24/22 2306 03/25/22 0518 03/25/22 0736  BP: (!) 127/55 (!) 165/70 (!) 144/62 (!) 170/68  Pulse: (!) 43 (!) 42 (!) 49 (!) 41  Resp: '18 18 18 16  '$ Temp: 98.9 F (37.2 C) 98.1 F (36.7 C) 97.8 F (36.6 C) 97.8 F (36.6 C)  TempSrc: Oral     SpO2: 100% 100% 98% 100%  Weight:   62 kg   Height:        Intake/Output Summary (Last 24 hours) at 03/25/2022 0827 Last data filed at 03/24/2022 0900 Gross per 24 hour  Intake 240 ml  Output --  Net 240 ml      03/25/2022    5:18 AM 03/24/2022    3:00 AM 03/23/2022   11:25 AM  Last 3 Weights  Weight (lbs) 136 lb 11 oz 137 lb 12.6 oz 134 lb  Weight (kg) 62 kg 62.5 kg 60.782 kg      Telemetry    SB with rates in the 40s, occasional PVC, V-paced - Personally Reviewed  ECG    No new tracings - Personally Reviewed  Physical Exam   GEN: No acute distress.  Sitting up in bed, pleasant. Neck: No JVD Cardiac: RRR, bradycardia, I/VI systolic murmur  LSB, no rubs or gallops.  Respiratory: Clear to auscultation bilaterally. Even and unlabored on room air. GI: Soft, nontender, non-distended  MS: No edema; No deformity. Neuro:  Nonfocal  Psych: Normal affect, confused to situation, poor historian today, has history of "memory issues" per chart  Labs    High Sensitivity Troponin:  No results for input(s): "TROPONINIHS" in the last 720 hours.   Chemistry Recent Labs  Lab 03/23/22 1227 03/24/22 0623 03/25/22 0635  NA 144 144 141  K 3.4* 3.7 3.1*  CL 111 113* 110  CO2 '25 27 26  '$ GLUCOSE 106* 89 95  BUN '18 16 20  '$ CREATININE 0.67 0.60 0.57  CALCIUM 9.0 8.9 9.2  PROT 6.1*  --   --   ALBUMIN 3.4*  --   --   AST 33  --   --   ALT 28  --   --   ALKPHOS 63  --   --   BILITOT 1.1  --   --   GFRNONAA >60 >60 >60  ANIONGAP 8 4* 5    Lipids No results for input(s): "CHOL", "TRIG", "HDL", "LABVLDL", "LDLCALC", "CHOLHDL" in the last 168 hours.  Hematology Recent Labs  Lab  03/23/22 1227 03/25/22 0635  WBC 5.1 5.1  RBC 3.90 3.55*  HGB 11.8* 10.7*  HCT 36.1 32.6*  MCV 92.6 91.8  MCH 30.3 30.1  MCHC 32.7 32.8  RDW 13.4 13.2  PLT 152 154   Thyroid  Recent Labs  Lab 03/23/22 1227  TSH 3.350    BNP Recent Labs  Lab 03/23/22 1227  BNP 147.4*    DDimer No results for input(s): "DDIMER" in the last 168 hours.   Radiology    No results found.  Cardiac Studies   Echocardiogram completed 08/27/2020 1. Left ventricular ejection fraction, by estimation, is 45 to 50%. The  left ventricle has mildly decreased function. Left ventricular endocardial border not optimally defined to evaluate regional wall motion. Left ventricular diastolic parameters are indeterminate.   2. Right ventricular systolic function is normal. The right ventricular  size is normal. Mildly increased right ventricular wall thickness.   3. The mitral valve is degenerative. Mild to moderate mitral valve  regurgitation. No evidence of mitral stenosis.   4.  The aortic valve was not well visualized. Aortic valve regurgitation is mild to moderate.   5. Aortic dilatation noted. There is mild dilatation of the ascending  aorta, measuring 38 mm.   6. The inferior vena cava is normal in size with greater than 50%  respiratory variability, suggesting right atrial pressure of 3 mmHg  Patient Profile     88 y.o. female with a history of nonobstructive CAD by left heart cath in 2004 in the [place], history of pacemaker implementation in 2006 with generator change out in 2011 possibly due to symptomatic bradycardia, frequent PVCs, essential hypertension, hyperlipidemia, anxiety, and depression requiring ECT treatment dating back to the 1980s who is being seen for the evaluation of bradycardia  Assessment & Plan    Symptomatic bradycardia -Device interrogation while here shows she is without atrial tracking -Hemodynamically stable with asymptomatic bradycardia -[employer] representative was in to reprogram device to DDD rates of 60-120 -there were no issues with the atrial lead -continue hands free pacing pads to remain on patient -patient to follow up in device clinic and EP after discharge   2.  Premature ventricular contractions -Longstanding history of PVCs -Stated previously she was told that was the reason that she needed to have her pacemaker was that she had the PVCs -She has previously declined any medical therapy   3.  Hypokalemia -Potassium level today 3.4 -Replete per primary team -Recommend keeping potassium level closer to 4 -Daily BMP   4.  Essential hypertension -Blood pressure elevated today to 170/68 this morning -Recommend changing amlodipine 5 mg daily to BID -Continue as needed hydralazine -Vital signs per unit protocol -Continue to monitor/trend/replete as needed     For questions or updates, please contact [facility] Please consult [email or web address] for contact info under        Signed, Trajan [place], NP   03/25/2022, 8:27 AM

## 2022-03-25 NOTE — Telephone Encounter (Signed)
Pt currently admitted.

## 2022-03-25 NOTE — Procedures (Signed)
ECT SERVICES Physician's Interval Evaluation & Treatment Note  Patient Identification: Barbara Marks MRN:  [id number] Date of Evaluation:  03/25/2022 TX #: 23, or 2 of this series  MADRS:   MMSE:   P.E. Findings:  Stable.  Cardiac rhythm much more stable with pacemaker reprogrammed  Psychiatric Interval Note:  More energetic  Subjective:  Patient is a 88 y.o. female seen for evaluation for Electroconvulsive Therapy. Cranky and irritable today  Treatment Summary:   '[]'$   Right Unilateral             '[x]'$  Bilateral   % Energy : 1.0 ms 75%   Impedance: 1850 ohms  Seizure Energy Index: 1930 V squared  Postictal Suppression Index: 77%  Seizure Concordance Index: 85%  Medications  Pre Shock: Toradol 15 mg Brevital 80 mg succinylcholine 90 mg  Post Shock:    Seizure Duration: EMG 48 seconds EEG 61 seconds   Comments: 1 more treatment on Friday and that will probably be all she can tolerate and will help to get her mood stable  Lungs:  '[x]'$   Clear to auscultation               '[]'$  Other:   Heart:    '[x]'$   Regular rhythm             '[]'$  irregular rhythm    '[x]'$   Previous H&P reviewed, patient examined and there are NO CHANGES                 '[]'$   Previous H&P reviewed, patient examined and there are changes noted.   [name], MD 6/21/20234:59 PM

## 2022-03-25 NOTE — Anesthesia Procedure Notes (Signed)
Procedure Name: General with mask airway Date/Time: 03/25/2022 2:05 PM  Performed by: Tollie Eth, CRNAPre-anesthesia Checklist: Patient identified, Emergency Drugs available, Suction available and Patient being monitored Patient Re-evaluated:Patient Re-evaluated prior to induction Oxygen Delivery Method: Circle system utilized Preoxygenation: Pre-oxygenation with 100% oxygen Induction Type: IV induction Ventilation: Mask ventilation without difficulty and Mask ventilation throughout procedure Airway Equipment and Method: Bite block Placement Confirmation: positive ETCO2 Dental Injury: Teeth and Oropharynx as per pre-operative assessment

## 2022-03-25 NOTE — Anesthesia Preprocedure Evaluation (Signed)
Anesthesia Evaluation  Patient identified by MRN, date of birth, ID band Patient awake    Reviewed: Allergy & Precautions, H&P , NPO status , Patient's Chart, lab work & pertinent test results, reviewed documented beta blocker date and time   Airway Mallampati: II   Neck ROM: full    Dental  (+) Poor Dentition   Pulmonary neg pulmonary ROS,    Pulmonary exam normal        Cardiovascular Exercise Tolerance: Poor hypertension, On Medications + CAD and +CHF  negative cardio ROS Normal cardiovascular exam+ dysrhythmias + pacemaker  Rhythm:regular Rate:Normal  Pacemaker reprogrammed and evaluated by rep. Sp Cards evaluation.     Neuro/Psych PSYCHIATRIC DISORDERS Anxiety Depression negative neurological ROS  negative psych ROS   GI/Hepatic negative GI ROS, Neg liver ROS,   Endo/Other  negative endocrine ROS  Renal/GU Renal diseasenegative Renal ROS  negative genitourinary   Musculoskeletal   Abdominal   Peds  Hematology negative hematology ROS (+) Blood dyscrasia, anemia ,   Anesthesia Other Findings Past Medical History: No date: Anxiety Non-obstructive: CAD (coronary artery disease)     Comment:  a. 2004 Cath: nonobs dzs. No date: Chronic systolic CHF (congestive heart failure) (HCC) No date: DDD (degenerative disc disease)     Comment:  a. with chronic right sided back pain - improves after               seeing chiropractor. No date: Depression     Comment:  a. h/o ECT No date: Diverticulitis No date: Glaucoma No date: HTN (hypertension) No date: Hyperlipidemia, mixed 2008: Melanoma of skin ([place])     Comment:  resected from Left arm  No date: Memory problem     Comment:  "states memory issues" No date: Nephrolithiasis No date: Pancreatic cyst     Comment:  a. [id number] Endoscopic U/S: nl UGI tract, 1-3m pancreatic               cysts, no masses/nodules. No date: Premature ventricular contraction      Comment:  a. managed with propafenone. No date: Presence of permanent cardiac pacemaker No date: Symptomatic bradycardia     Comment:  a. s/p MDT PPM in 08/2005;  b. 02/2010 Gen change->MDT               Adapta DC PPM, ser # [id number]. Past Surgical History: 1952: APPENDECTOMY No date: CATARACT EXTRACTION, BILATERAL 1983: CHOLECYSTECTOMY 06/22/2013: EUS; N/A     Comment:  Procedure: UPPER ENDOSCOPIC ULTRASOUND (EUS) LINEAR;                Surgeon: DMilus Banister MD;  Location: [facility];                Service: Endoscopy;  Laterality: N/A; No date: EYE SURGERY; Bilateral     Comment:  Cataract Extraction with IOL No date: PACEMAKER INSERTION; Left     Comment:  Medtronic No date: TUMOR EXCISION     Comment:  And nemamgeomas BMI    Body Mass Index: 22.06 kg/m     Reproductive/Obstetrics negative OB ROS                             Anesthesia Physical Anesthesia Plan  ASA: 3  Anesthesia Plan: General   Post-op Pain Management:    Induction:   PONV Risk Score and Plan:   Airway Management Planned:   Additional Equipment:   Intra-op Plan:  Post-operative Plan:   Informed Consent: I have reviewed the patients History and Physical, chart, labs and discussed the procedure including the risks, benefits and alternatives for the proposed anesthesia with the patient or authorized representative who has indicated his/her understanding and acceptance.     Dental Advisory Given  Plan Discussed with: CRNA  Anesthesia Plan Comments:         Anesthesia Quick Evaluation

## 2022-03-25 NOTE — Transfer of Care (Signed)
Immediate Anesthesia Transfer of Care Note  Patient: Barbara Marks  Procedure(s) Performed: ECT TX  Patient Location: PACU  Anesthesia Type:General  Level of Consciousness: drowsy  Airway & Oxygen Therapy: Patient Spontanous Breathing and Patient connected to nasal cannula oxygen  Post-op Assessment: Report given to RN and Post -op Vital signs reviewed and stable  Post vital signs: Reviewed and stable  Last Vitals:  Vitals Value Taken Time  BP 167/85 03/25/22 1415  Temp    Pulse 77 03/25/22 1418  Resp 16 03/25/22 1418  SpO2 99 % 03/25/22 1418  Vitals shown include unvalidated device data.  Last Pain:  Vitals:   03/24/22 2004  TempSrc: Oral  PainSc:          Complications: No notable events documented.

## 2022-03-25 NOTE — Telephone Encounter (Signed)
Spoke with patient and she states that cardiology has been around to see her. She states it is in reference to her device battery. Advised that they would monitor during her hospital stay and once she is discharged they will set her up with necessary appointments. She verbalized understanding with no further questions at this time.    Discussed with rounding APP and she stated pacemaker has 19 months for that battery if she should call back with further questions.

## 2022-03-25 NOTE — Progress Notes (Signed)
Responded to consult for IV. On arrival to room, pt refusing IV and states "I'm not taking anything you give me". Pt states she "has been kidnapped". Attempt to re-orient pt unsuccessful; requested RN to assist.

## 2022-03-25 NOTE — Progress Notes (Addendum)
  Progress Note   Patient: Barbara Marks IHW:[id number] DOB: 25-Nov-1933 DOA: 03/23/2022     0 DOS: the patient was seen and examined on 03/25/2022     Assessment and Plan: * Acute metabolic encephalopathy Patient still confused post ECT treatment.  Since she lives alone I feel more comfortable watching her overnight and discharging in the morning.  Bradycardia Pacemaker reset this morning.  Major depression Status post ECT treatments today.  Patient still confused.  Continue Remeron.   Essential hypertension Continue Norvasc  Chronic systolic CHF (congestive heart failure) (HCC) No signs of congestive heart failure  CAD (coronary artery disease) No complaints of chest pain.        Subjective: I called her after ECT treatment.  She admitted the nursing staff to hold the phone for her.  She was a little confused afterwards.  I gave it a few hours and then went up to reevaluate her.  She is still confused about things about where her car is parked why she is here.  I told her that she had ECT treatment today.  The patient lives alone.  With more confusion this afternoon than this morning I decided to watch her overnight.  Physical Exam: Vitals:   03/25/22 1445 03/25/22 1447 03/25/22 1454 03/25/22 1623  BP: (!) 164/89 (!) 164/89  (!) 149/75  Pulse: 96 77 79 74  Resp: 20 18 (!) 21 17  Temp:    98.4 F (36.9 C)  TempSrc:      SpO2: 96% 96% 97% 100%  Weight:      Height:       Physical Exam HENT:     Head: Normocephalic.     Mouth/Throat:     Pharynx: No oropharyngeal exudate.  Eyes:     General: Lids are normal.     Conjunctiva/sclera: Conjunctivae normal.  Cardiovascular:     Rate and Rhythm: Normal rate and regular rhythm.     Heart sounds: Normal heart sounds, S1 normal and S2 normal.  Pulmonary:     Breath sounds: No decreased breath sounds, wheezing, rhonchi or rales.  Abdominal:     Palpations: Abdomen is soft.     Tenderness: There is no abdominal  tenderness.  Musculoskeletal:     Right lower leg: No swelling.     Left lower leg: No swelling.  Skin:    General: Skin is warm.     Findings: No rash.  Neurological:     Mental Status: She is confused.     Data Reviewed: Potassium 3.1, hemoglobin 10.7  Family Communication: Spoke with friend on the phone  Disposition: Status is: Observation Patient confused after ECT treatment.  This has not cleared up after couple hours.  Planned Discharge Destination: Home  Author: Loletha Grayer, MD 03/25/2022 4:42 PM  For on call review [email or web address].

## 2022-03-25 NOTE — Telephone Encounter (Signed)
-----   Message from Gerrie Nordmann, NP sent at 03/25/2022 11:30 AM EDT ----- Regarding: hospital follow up Please make hospital follow up with device clinic and EP as she is being discharged today. She will also need to regular follow up post hospital in 3-4 weeks with [place]. Thank you

## 2022-03-25 NOTE — Assessment & Plan Note (Signed)
Patient still confused post ECT treatment.  Since she lives alone I feel more comfortable watching her overnight and discharging in the morning.

## 2022-03-26 DIAGNOSIS — R001 Bradycardia, unspecified: Secondary | ICD-10-CM | POA: Diagnosis not present

## 2022-03-26 DIAGNOSIS — I5022 Chronic systolic (congestive) heart failure: Secondary | ICD-10-CM | POA: Diagnosis not present

## 2022-03-26 DIAGNOSIS — I10 Essential (primary) hypertension: Secondary | ICD-10-CM | POA: Diagnosis not present

## 2022-03-26 DIAGNOSIS — G9341 Metabolic encephalopathy: Secondary | ICD-10-CM | POA: Diagnosis not present

## 2022-03-26 DIAGNOSIS — F332 Major depressive disorder, recurrent severe without psychotic features: Secondary | ICD-10-CM | POA: Diagnosis not present

## 2022-03-26 MED ORDER — BUPROPION HCL ER (XL) 150 MG PO TB24: 150.0000 mg | ORAL_TABLET | Freq: Every day | ORAL | 2 refills | Status: AC

## 2022-03-26 MED ORDER — BUPROPION HCL ER (XL) 150 MG PO TB24
150.0000 mg | ORAL_TABLET | Freq: Every day | ORAL | Status: DC
  Administered 2022-03-26: 150 mg via ORAL
  Filled 2022-03-26: qty 1

## 2022-03-26 MED ORDER — AMLODIPINE BESYLATE 5 MG PO TABS: 5.0000 mg | ORAL_TABLET | Freq: Two times a day (BID) | ORAL | Status: DC

## 2022-03-26 NOTE — Discharge Summary (Signed)
Physician Discharge Summary   Patient: Barbara Marks MRN: [id number] DOB: [birth year]  Admit date:     03/23/2022  Discharge date: 03/26/22  Discharge Physician: Loletha Grayer   PCP: Lesleigh Noe, MD   Recommendations at discharge:   Follow-up PCP 5 days Follow-up psychiatry team  Discharge Diagnoses: Principal Problem:   Acute metabolic encephalopathy Active Problems:   Bradycardia   Major depression   Essential hypertension   Chronic systolic CHF (congestive heart failure) (HCC)   CAD (coronary artery disease)    Hospital Course: Patient was admitted on 03/23/2022 and discharged on 03/26/2022.  The patient came from home and came in for elective ECT treatment on 03/23/2022 and then had bradycardia.  The patient was admitted medically.  Her pacemaker needed to be reset.  The patient had her pacemaker reset and Dr. Weber Cooks did a another ECT treatment on 03/25/2022.  The patient was very confused after the procedure and since the patient lives alone, I decided to watch her overnight again.  Patient's mental status was better on 03/26/2022.  Dr. Weber Cooks came by and evaluated her and cleared her to go home.  He does not want to do a another ECT tomorrow and they will discuss next month.  He did prescribe Wellbutrin to go home with.  Assessment and Plan: * Acute metabolic encephalopathy Patient is better than yesterday.  She was very confused yesterday and did not recall even having ECT treatments.  That period of time she will never remember as a memory.  Bradycardia Pacemaker reset on 03/25/2022  Major depression Status post ECT treatments on Monday and Wednesday.  Seen by Dr. Weber Cooks and he does not want to do any more ECT treatments currently.  The ECT treatment team will follow-up with her as outpatient and discuss further.  Dr. Weber Cooks recommended Wellbutrin and he prescribed that going into her pharmacy.   Essential hypertension Continue Norvasc  Chronic systolic CHF  (congestive heart failure) (HCC) No signs of congestive heart failure  CAD (coronary artery disease) No complaints of chest pain.         Consultants: Cardiology, psychiatry Procedures performed: ECT treatments x2, pacemaker reprogramming Disposition: Home Diet recommendation:  Cardiac diet DISCHARGE MEDICATION: Allergies as of 03/26/2022       Reactions   Codeine    unknown   Influenza Vaccines Cough, Hypertension, Nausea And Vomiting   Simvastatin Other (See Comments)   "leg cramps"   Statins Other (See Comments)   Pt states that she just can't handle Statin medications         Medication List     STOP taking these medications    COLLAGEN PO   diclofenac Sodium 1 % Gel Commonly known as: VOLTAREN   doxycycline 100 MG capsule Commonly known as: MONODOX   MAGNESIUM PO   mirtazapine 15 MG tablet Commonly known as: REMERON   mupirocin ointment 2 % Commonly known as: BACTROBAN   PreserVision AREDS Tabs       TAKE these medications    amLODipine 5 MG tablet Commonly known as: NORVASC Take 1 tablet (5 mg total) by mouth 2 (two) times daily. What changed: when to take this   B COMPLEX PO Take 1 tablet by mouth daily.   buPROPion 150 MG 24 hr tablet Commonly known as: WELLBUTRIN XL Take 1 tablet (150 mg total) by mouth daily.   MIRALAX PO Take by mouth daily as needed.   multivitamin tablet Take 1 tablet by mouth daily.  naproxen sodium 220 MG tablet Commonly known as: ALEVE Take 220 mg by mouth daily as needed.   potassium chloride 10 MEQ CR capsule Commonly known as: MICRO-K TAKE 1 CAPSULE(10 MEQ) BY MOUTH DAILY   Red Yeast Rice 600 MG Caps Take 1,200 mg by mouth daily at 12 noon.   TURMERIC PO Take 1 tablet by mouth daily.        Follow-up Information     Lesleigh Noe, MD. Go in 5 day(s).   Specialty: Family Medicine Why: Thursday 04/02/22 at 12:00 noon Contact information: [address] [place]  [address] [phone]                Discharge Exam: Filed Weights   03/24/22 0300 03/25/22 0518 03/25/22 1335  Weight: 62.5 kg 62 kg 62 kg   Physical Exam HENT:     Head: Normocephalic.     Mouth/Throat:     Pharynx: No oropharyngeal exudate.  Eyes:     General: Lids are normal.     Conjunctiva/sclera: Conjunctivae normal.  Cardiovascular:     Rate and Rhythm: Normal rate and regular rhythm.     Heart sounds: Normal heart sounds, S1 normal and S2 normal.  Pulmonary:     Breath sounds: No decreased breath sounds, wheezing, rhonchi or rales.  Abdominal:     Palpations: Abdomen is soft.     Tenderness: There is no abdominal tenderness.  Musculoskeletal:     Right lower leg: No swelling.     Left lower leg: No swelling.  Skin:    General: Skin is warm.     Findings: No rash.  Neurological:     Mental Status: She is alert.      Condition at discharge: stable  The results of significant diagnostics from this hospitalization (including imaging, microbiology, ancillary and laboratory) are listed below for reference.   Imaging Studies: No results found.  Microbiology: Results for orders placed or performed during the hospital encounter of 01/30/21  SARS CORONAVIRUS 2 (TAT 6-24 HRS) Nasopharyngeal Nasopharyngeal Swab     Status: None   Collection Time: 01/30/21 10:46 AM   Specimen: Nasopharyngeal Swab  Result Value Ref Range Status   SARS Coronavirus 2 NEGATIVE NEGATIVE Final    Comment: (NOTE) SARS-CoV-2 target nucleic acids are NOT DETECTED.  The SARS-CoV-2 RNA is generally detectable in upper and lower respiratory specimens during the acute phase of infection. Negative results do not preclude SARS-CoV-2 infection, do not rule out co-infections with other pathogens, and should not be used as the sole basis for treatment or other patient management decisions. Negative results must be combined with clinical observations, patient history, and epidemiological  information. The expected result is Negative.  Fact Sheet for Patients: SugarRoll.be  Fact Sheet for Healthcare Providers: https://www.woods-mathews.com/  This test is not yet approved or cleared by the [place] FDA and  has been authorized for detection and/or diagnosis of SARS-CoV-2 by FDA under an Emergency Use Authorization (EUA). This EUA will remain  in effect (meaning this test can be used) for the duration of the COVID-19 declaration under Se ction 564(b)(1) of the Act, 21 U.S.C. section 360bbb-3(b)(1), unless the authorization is terminated or revoked sooner.  Performed at [facility], [place] [address], [place], [place] [address]     Labs: CBC: Recent Labs  Lab 03/23/22 1227 03/25/22 0635  WBC 5.1 5.1  NEUTROABS 3.0  --   HGB 11.8* 10.7*  HCT 36.1 32.6*  MCV  92.6 91.8  PLT 152 563   Basic Metabolic Panel: Recent Labs  Lab 03/23/22 1227 03/24/22 0623 03/25/22 0635  NA 144 144 141  K 3.4* 3.7 3.1*  CL 111 113* 110  CO2 '25 27 26  '$ GLUCOSE 106* 89 95  BUN '18 16 20  '$ CREATININE 0.67 0.60 0.57  CALCIUM 9.0 8.9 9.2   Liver Function Tests: Recent Labs  Lab 03/23/22 1227  AST 33  ALT 28  ALKPHOS 63  BILITOT 1.1  PROT 6.1*  ALBUMIN 3.4*     Discharge time spent: greater than 30 minutes.  Signed: Loletha Grayer, MD [facility] 03/26/2022

## 2022-03-26 NOTE — Progress Notes (Signed)
Patient dressed, peripheral IV's and tele removed, friend to come pick up at 2pm, denies needs at this time, discharge paperwork reviewed and went over with patient.

## 2022-03-27 ENCOUNTER — Inpatient Hospital Stay: Admit: 2022-03-27 | Payer: PPO

## 2022-03-27 ENCOUNTER — Encounter: Payer: Self-pay | Admitting: Certified Registered"

## 2022-03-31 NOTE — Anesthesia Postprocedure Evaluation (Signed)
Anesthesia Post Note  Patient: Barbara Marks  Procedure(s) Performed: ECT TX  Patient location during evaluation: PACU Anesthesia Type: General Level of consciousness: awake and alert Pain management: pain level controlled Vital Signs Assessment: post-procedure vital signs reviewed and stable Respiratory status: spontaneous breathing, nonlabored ventilation, respiratory function stable and patient connected to nasal cannula oxygen Cardiovascular status: blood pressure returned to baseline and stable Postop Assessment: no apparent nausea or vomiting Anesthetic complications: no   No notable events documented.   Last Vitals:  Vitals:   03/26/22 0803 03/26/22 1135  BP: (!) 143/71 123/63  Pulse: 65 76  Resp: (!) 21 19  Temp: 36.9 C 36.7 C  SpO2: 98% 97%    Last Pain:  Vitals:   03/26/22 1135  TempSrc: Oral  PainSc:                  Lenard Simmer

## 2022-04-02 ENCOUNTER — Inpatient Hospital Stay: Payer: PPO | Admitting: Family Medicine

## 2022-04-06 ENCOUNTER — Ambulatory Visit (INDEPENDENT_AMBULATORY_CARE_PROVIDER_SITE_OTHER): Payer: PPO | Admitting: Primary Care

## 2022-04-06 ENCOUNTER — Encounter: Payer: Self-pay | Admitting: Primary Care

## 2022-04-06 ENCOUNTER — Ambulatory Visit: Payer: PPO | Admitting: Nurse Practitioner

## 2022-04-06 DIAGNOSIS — R001 Bradycardia, unspecified: Secondary | ICD-10-CM | POA: Diagnosis not present

## 2022-04-06 DIAGNOSIS — R600 Localized edema: Secondary | ICD-10-CM | POA: Diagnosis not present

## 2022-04-06 NOTE — Progress Notes (Signed)
Subjective:    Patient ID: Barbara Marks, female    DOB: [birth year], 88 y.o.   MRN: [id number]  HPI  Barbara Marks is a very pleasant 88 y.o. female patient of Dr. Einar Pheasant with a history of hypertension, CAD, CHF, pacemaker, acute metabolic encephalopathy, renal stones, hyperlipidemia, anemia who presents today to discuss lower extremity edema and for hospital follow up.  Admitted to [facility] on 03/23/2022 for bradycardia prior to an elective ECT treatment.  It was advised for her pacemaker to be reset before treatment.  Pacemaker was reset on 03/25/2022, and patient was provided with ECT treatment on 03/25/2022.  After her ECT procedure the patient became confused so it was advised she stay another evening in the hospital.  On 03/26/2022 her mental status had improved so she was discharged home with a prescription for bupropion XL 150 mg daily.  Today she endorses bilateral lower extremity swelling from the ankles through proximal ankle that began 5-7 days ago. She began elevating her legs with rest and as of two days ago her swelling has resolved. She denies long travel, increased activity, or any other provoking factors.   She denies shortness of breath, chest pain. She has no concerns today. She is managed on amlodipine but has not experienced edema as a side effect.   She does mention that the batteries in her pacemaker died numerous years ago. She was told that the pacemaker was placed years ago and that this was a mistake. Her current cardiologist told her that the pacemaker was safer inside of her chest not working rather than having it removed.   Review of Systems  Respiratory:  Negative for shortness of breath.   Cardiovascular:  Positive for leg swelling. Negative for chest pain.  Skin:  Negative for color change.  Neurological:  Negative for dizziness.         Past Medical History:  Diagnosis Date   Anxiety    CAD (coronary artery disease) Non-obstructive   a.  2004 Cath: nonobs dzs.   Chronic systolic CHF (congestive heart failure) (HCC)    DDD (degenerative disc disease)    a. with chronic right sided back pain - improves after seeing chiropractor.   Depression    a. h/o ECT   Diverticulitis    Glaucoma    HTN (hypertension)    Hyperlipidemia, mixed    Melanoma of skin ([place]) 2008   resected from Left arm    Memory problem    "states memory issues"   Nephrolithiasis    Pancreatic cyst    a. 40981 Endoscopic U/S: nl UGI tract, 1-95m pancreatic cysts, no masses/nodules.   Premature ventricular contraction    a. managed with propafenone.   Presence of permanent cardiac pacemaker    Symptomatic bradycardia    a. s/p MDT PPM in 08/2005;  b. 02/2010 Gen change->MDT Adapta DC PPM, ser # [id number].    Social History   Socioeconomic History   Marital status: Widowed    Spouse name: Not on file   Number of children: 3   Years of education: some college   Highest education level: Not on file  Occupational History   Occupation: Retired    [employer] farm manager RETIRED  Tobacco Use   Smoking status: Never   Smokeless tobacco: Never  Vaping Use   Vaping Use: Never used  Substance and Sexual Activity   Alcohol use: No   Drug use: No   Sexual activity: Not Currently  Other Topics  Concern   Not on file  Social History Narrative   Lives in [place]. Lives with a friend (also 78 year - Paul).    3 Kids, 7 grandchildren, 1 great-grandchild   Enjoys: movies on Friday with friends, walks   Exercise: walking around the house for exercise   Diet: likes everything   Social Determinants of Health   Financial Resource Strain: Low Risk  (06/01/2018)   Overall Financial Resource Strain (CARDIA)    Difficulty of Paying Living Expenses: Not hard at all  Food Insecurity: No Food Insecurity (06/01/2018)   Hunger Vital Sign    Worried About Running Out of Food in the Last Year: Never true    Ran Out of Food in the Last Year: Never true  Transportation  Needs: No Transportation Needs (06/01/2018)   PRAPARE - Hydrologist (Medical): No    Lack of Transportation (Non-Medical): No  Physical Activity: Inactive (06/01/2018)   Exercise Vital Sign    Days of Exercise per Week: 0 days    Minutes of Exercise per Session: 0 min  Stress: Stress Concern Present (06/01/2018)   [place]    Feeling of Stress : Very much  Social Connections: Somewhat Isolated (06/01/2018)   Social Connection and Isolation Panel [NHANES]    Frequency of Communication with Friends and Family: More than three times a week    Frequency of Social Gatherings with Friends and Family: More than three times a week    Attends Religious Services: More than 4 times per year    Active Member of [employer] or Organizations: No    Attends Archivist Meetings: Never    Marital Status: Widowed  Intimate Partner Violence: Unknown (06/01/2018)   Humiliation, Afraid, Rape, and Kick questionnaire    Fear of Current or Ex-Partner: Not asked    Emotionally Abused: Not asked    Physically Abused: Not asked    Sexually Abused: Not asked    Past Surgical History:  Procedure Laterality Date   APPENDECTOMY  1952   CATARACT EXTRACTION, [place]   EUS N/A 06/22/2013   Procedure: UPPER ENDOSCOPIC ULTRASOUND (EUS) LINEAR;  Surgeon: Milus Banister, MD;  Location: [facility];  Service: Endoscopy;  Laterality: N/A;   EYE SURGERY Bilateral    Cataract Extraction with IOL   PACEMAKER INSERTION Left    Medtronic   TUMOR EXCISION     And nemamgeomas    Family History  Problem Relation Age of Onset   Other Mother        died @ 21 - old age.   Stroke Father        cva after cea in his 72's, died @ 45.   Kidney cancer Brother     Allergies  Allergen Reactions   Codeine     unknown   Influenza Vaccines Cough, Hypertension and Nausea And Vomiting   Simvastatin  Other (See Comments)    "leg cramps"   Statins Other (See Comments)    Pt states that she just can't handle Statin medications     Current Outpatient Medications on File Prior to Visit  Medication Sig Dispense Refill   amLODipine (NORVASC) 5 MG tablet Take 1 tablet (5 mg total) by mouth 2 (two) times daily.     B Complex Vitamins (B COMPLEX PO) Take 1 tablet by mouth daily.     Multiple Vitamin (MULTIVITAMIN) tablet  Take 1 tablet by mouth daily.       naproxen sodium (ALEVE) 220 MG tablet Take 220 mg by mouth daily as needed.     Polyethylene Glycol 3350 (MIRALAX PO) Take by mouth daily as needed.     potassium chloride (MICRO-K) 10 MEQ CR capsule TAKE 1 CAPSULE(10 MEQ) BY MOUTH DAILY 90 capsule 1   Red Yeast Rice 600 MG CAPS Take 1,200 mg by mouth daily at 12 noon.     TURMERIC PO Take 1 tablet by mouth daily.     buPROPion (WELLBUTRIN XL) 150 MG 24 hr tablet Take 1 tablet (150 mg total) by mouth daily. (Patient not taking: Reported on 04/06/2022) 30 tablet 2   No current facility-administered medications on file prior to visit.    BP 126/78   Pulse (!) 58   Temp 98.6 F (37 C) (Oral)  Objective:   Physical Exam Cardiovascular:     Rate and Rhythm: Regular rhythm. Bradycardia present.     Pulses:          Dorsalis pedis pulses are 2+ on the right side and 2+ on the left side.       Posterior tibial pulses are 2+ on the right side and 2+ on the left side.     Comments: No edema noted to lower extremities on exam  Pulmonary:     Effort: Pulmonary effort is normal.     Breath sounds: Normal breath sounds.  Musculoskeletal:     Cervical back: Neck supple.  Skin:    General: Skin is warm and dry.           Assessment & Plan:   Problem List Items Addressed This Visit       Other   Bradycardia    Recent hospitalization. Hospital labs and notes reviewed.  It appears that her pacemaker was interrogate and reset.   She is slightly bradycardic today but she is  asymptomatic. Also with prior history of such.  Continue to monitor.   Not on beta blocker.       Bilateral lower extremity edema    No evidence on exam today, symptoms resolved 2 days ago.  We discussed instructions for elevation, a balance of activity and rest, and return precautions if symptoms were to return.            Pleas Koch, NP

## 2022-04-06 NOTE — Assessment & Plan Note (Signed)
No evidence on exam today, symptoms resolved 2 days ago.  We discussed instructions for elevation, a balance of activity and rest, and return precautions if symptoms were to return.

## 2022-04-06 NOTE — Assessment & Plan Note (Addendum)
Recent hospitalization. Hospital labs and notes reviewed.  It appears that her pacemaker was interrogate and reset.   She is slightly bradycardic today but she is asymptomatic. Also with prior history of such.  Continue to monitor.   Not on beta blocker.

## 2022-04-06 NOTE — Patient Instructions (Signed)
You are not due for your annual follow up with Dr. Einar Pheasant until February 2024.  Elevate your legs if you notice the swelling to return. Come see [place] if no resolve.   It was a pleasure meeting you!

## 2022-04-23 ENCOUNTER — Ambulatory Visit: Payer: PPO | Admitting: Nurse Practitioner

## 2022-05-06 ENCOUNTER — Telehealth: Payer: Self-pay | Admitting: Internal Medicine

## 2022-05-06 NOTE — Telephone Encounter (Signed)
Patient said her pacer been dead in the appt needed for the 8/29. Please advise

## 2022-05-06 NOTE — Telephone Encounter (Signed)
To Device Clinic to review. This patient advised the call center to [facility] battery is dead, however, she was in the hospital in June 2023 and per Dr. Thereasa Solo note: A/P: 1.  Bradycardia s/p PPM -Pacemaker reprogrammed to DDD, functioning normally, battery life 25 months -Okay for discharge from a cardiac perspective, follow-up with electrophysiology as outpatient   I think the patient has just not had EP follow up in a while and needs to re-establish with her device.  Will forward to [facility] to confirm.

## 2022-05-06 NOTE — Telephone Encounter (Signed)
Spoke with patient regarding upcoming apt with [facility] on 06/02/22 patient was under the impression that her pacemaker was dead informed her that on the hospital check 03/2022 patients pacemaker was still working and had 43month of battery life, Patient read back dates and times of upcoming appointments.

## 2022-06-02 ENCOUNTER — Other Ambulatory Visit
Admission: RE | Admit: 2022-06-02 | Discharge: 2022-06-02 | Disposition: A | Discharge status: Home / Self Care | Payer: PPO | Attending: Internal Medicine | Admitting: Internal Medicine

## 2022-06-02 ENCOUNTER — Ambulatory Visit: Payer: PPO | Attending: Internal Medicine | Admitting: Internal Medicine

## 2022-06-02 ENCOUNTER — Encounter: Payer: Self-pay | Admitting: Internal Medicine

## 2022-06-02 VITALS — BP 138/74 | HR 84 | Ht 66.0 in | Wt 132.0 lb

## 2022-06-02 DIAGNOSIS — I10 Essential (primary) hypertension: Secondary | ICD-10-CM | POA: Insufficient documentation

## 2022-06-02 DIAGNOSIS — Z950 Presence of cardiac pacemaker: Secondary | ICD-10-CM

## 2022-06-02 DIAGNOSIS — R001 Bradycardia, unspecified: Secondary | ICD-10-CM

## 2022-06-02 DIAGNOSIS — I493 Ventricular premature depolarization: Secondary | ICD-10-CM | POA: Insufficient documentation

## 2022-06-02 LAB — BASIC METABOLIC PANEL
Anion gap: 6 (ref 5–15)
BUN: 14 mg/dL (ref 8–23)
CO2: 29 mmol/L (ref 22–32)
Calcium: 9.4 mg/dL (ref 8.9–10.3)
Chloride: 107 mmol/L (ref 98–111)
Creatinine, Ser: 0.65 mg/dL (ref 0.44–1.00)
GFR, Estimated: >60 mL/min (ref >60)
Glucose, Bld: 111 mg/dL — ABNORMAL HIGH (ref 70–99)
Potassium: 3.5 mmol/L (ref 3.5–5.1)
Sodium: 142 mmol/L (ref 135–145)

## 2022-06-02 LAB — CBC
HCT: 35.3 % — ABNORMAL LOW (ref 36.0–46.0)
Hemoglobin: 11.3 g/dL — ABNORMAL LOW (ref 12.0–15.0)
MCH: 29.7 pg (ref 26.0–34.0)
MCHC: 32 g/dL (ref 30.0–36.0)
MCV: 92.9 fL (ref 80.0–100.0)
Platelets: 221 10*3/uL (ref 150–400)
RBC: 3.8 MIL/uL — ABNORMAL LOW (ref 3.87–5.11)
RDW: 13.2 % (ref 11.5–15.5)
WBC: 5.2 10*3/uL (ref 4.0–10.5)
nRBC: 0 % (ref 0.0–0.2)

## 2022-06-02 NOTE — Progress Notes (Signed)
Patient Care Team: Lesleigh Noe, MD as PCP - General (Family Medicine) Kate Sable, MD as PCP - Cardiology (Cardiology) Clapacs, Madie Reno, MD as Consulting Physician (Psychiatry) Wellington Hampshire, MD as Consulting Physician (Cardiology)   HPI  Barbara Marks is a 88 y.o. female seen for the first time since 2015.  She has a history of ventricular ectopy treated remotely with propafenone and a pacemaker-Medtronic for which she underwent generator replacement 5/11     Cardiac cath in 2004 with nonobstructive CAD   Profound significant long history of depression and anxiety requiring ECT treatment dating back to the 59s ; ECT regime had been terminated by her new psychiatrist and she was treated with Cymbalta.  ECT was resumed at least in 6/23 when she was hospitalized with secondary acute metabolic encephalopathy  When presented she was noted to be bradycardic, device has been programmed DDI at 40, it was reprogrammed DDDR at a lower rate of 60; battery longevity was approximately 23 months.   She is quite adamant that she has had only one pacemaker implanted 2011 until we reviewed my note from 2010 outlining initial implant 2006 ( for PVCs)   Denies CP and SOB  DATE TEST EF   12/14 Echo  `50-55%   11/21 Echo   45-50 %              Date Cr K Hgb  2/13   3.6 12.7  6/23 0.57 3.1 10.7      No significant heart complaints     Past Medical History:  Diagnosis Date   Anxiety    CAD (coronary artery disease) Non-obstructive   a. 2004 Cath: nonobs dzs.   Chronic systolic CHF (congestive heart failure) (HCC)    DDD (degenerative disc disease)    a. with chronic right sided back pain - improves after seeing chiropractor.   Depression    a. h/o ECT   Diverticulitis    Glaucoma    HTN (hypertension)    Hyperlipidemia, mixed    Melanoma of skin ([facility]) 2008   resected from Left arm    Memory problem    "states memory issues"   Nephrolithiasis    Pancreatic  cyst    a. [address] Endoscopic U/S: nl UGI tract, 1-27m pancreatic cysts, no masses/nodules.   Premature ventricular contraction    a. managed with propafenone.   Presence of permanent cardiac pacemaker    Symptomatic bradycardia    a. s/p MDT PPM in 08/2005;  b. 02/2010 Gen change->MDT Adapta DC PPM, ser # [id number].    Past Surgical History:  Procedure Laterality Date   APPENDECTOMY  1952   CATARACT EXTRACTION, BILATERAL     CHOLECYSTECTOMY  1983   EUS N/A 06/22/2013   Procedure: UPPER ENDOSCOPIC ULTRASOUND (EUS) LINEAR;  Surgeon: DMilus Banister MD;  Location: [facility] ENDOSCOPY;  Service: Endoscopy;  Laterality: N/A;   EYE SURGERY Bilateral    Cataract Extraction with IOL   PACEMAKER INSERTION Left    Medtronic   TUMOR EXCISION     And nemamgeomas    Current Outpatient Medications  Medication Sig Dispense Refill   amLODipine (NORVASC) 5 MG tablet Take 1 tablet (5 mg total) by mouth 2 (two) times daily.     B Complex Vitamins (B COMPLEX PO) Take 1 tablet by mouth daily.     Multiple Vitamin (MULTIVITAMIN) tablet Take 1 tablet by mouth daily.       naproxen sodium (ALEVE)  220 MG tablet Take 220 mg by mouth daily as needed.     Polyethylene Glycol 3350 (MIRALAX PO) Take by mouth daily as needed.     potassium chloride (MICRO-K) 10 MEQ CR capsule TAKE 1 CAPSULE(10 MEQ) BY MOUTH DAILY 90 capsule 1   Red Yeast Rice 600 MG CAPS Take 1,200 mg by mouth daily at 12 noon.     TURMERIC PO Take 1 tablet by mouth daily.     buPROPion (WELLBUTRIN XL) 150 MG 24 hr tablet Take 1 tablet (150 mg total) by mouth daily. (Patient not taking: Reported on 04/06/2022) 30 tablet 2   No current facility-administered medications for this visit.    Allergies  Allergen Reactions   Codeine     unknown   Influenza Vaccines Cough, Hypertension and Nausea And Vomiting   Simvastatin Other (See Comments)    "leg cramps"   Statins Other (See Comments)    Pt states that she just can't handle Statin medications      Review of Systems negative except from HPI and PMH  Physical Exam BP 138/74 (BP Location: Left Arm, Patient Position: Sitting, Cuff Size: Normal)   Pulse 84   Ht '5\' 6"'$  (1.676 m)   Wt 132 lb (59.9 kg)   SpO2 98%   BMI 21.31 kg/m  Well developed and well nourished in no acute distress pale HENT normal Neck supple with JVP-flat Clear Device pocket well healed; without hematoma or erythema.  There is no tethering  Regular rate and rhythm, no murmur Abd-soft with active BS No Clubbing cyanosis  edema Skin-warm and dry A & Oriented  Grossly normal sensory and motor function Affect flat  ECG sinus at 84 Intervals 22/13/43 PVC-left bundle inferior axis  Assessment and  Plan  Pacemaker-Medtronic  PVCs  Hypertension  Anemia  Hypokalemia   Patient's device has been reprogrammed in hospital 6/23 as noted above.  Device function is normal with approximately 2 years of longevity.  As noted also above, the patient has no recollection that she has had 2 pacemaker procedures is quite adamant until we have reviewed the reports.  Denies issues with her memory in general.  Lives alone.  Last blood work suggested a new anemia.  We will recheck her hemoglobin today  Left blood work with significant hypokalemia we will recheck her metabolic profile today  PVC burden remains significant at about 12%.  Left ventricular function was mildly decreased 2 years ago; reviewed this with the patient, we will plan a repeat echo in 6 months.

## 2022-06-02 NOTE — Patient Instructions (Signed)
Medication Instructions:  - Your physician recommends that you continue on your current medications as directed. Please refer to the Current Medication list given to you today.  *If you need a refill on your cardiac medications before your next appointment, please call your pharmacy*   Lab Work: - none ordered  If you have labs (blood work) drawn today and your tests are completely normal, you will receive your results only by: MyChart Message (if you have MyChart) OR A paper copy in the mail If you have any lab test that is abnormal or we need to change your treatment, we will call you to review the results.   Testing/Procedures: - none ordered   Follow-Up: At [facility], you and your health needs are our priority.  As part of our continuing mission to provide you with exceptional heart care, we have created designated Provider Care Teams.  These Care Teams include your primary Cardiologist (physician) and Advanced Practice Providers (APPs -  Physician Assistants and Nurse Practitioners) who all work together to provide you with the care you need, when you need it.  We recommend signing up for the patient portal called "MyChart".  Sign up information is provided on this After Visit Summary.  MyChart is used to connect with patients for Virtual Visits (Telemedicine).  Patients are able to view lab/test results, encounter notes, upcoming appointments, etc.  Non-urgent messages can be sent to your provider as well.   To learn more about what you can do with MyChart, go to [email or web address].    Your next appointment:   6 month(s)  The format for your next appointment:   In Person  Provider:   Steven Klein, MD    Other Instructions N/a  Important Information About Sugar       

## 2022-06-05 ENCOUNTER — Ambulatory Visit: Payer: PPO | Admitting: Nurse Practitioner

## 2022-06-06 ENCOUNTER — Other Ambulatory Visit: Payer: Self-pay | Admitting: Family Medicine

## 2022-06-06 DIAGNOSIS — E876 Hypokalemia: Secondary | ICD-10-CM

## 2022-06-09 ENCOUNTER — Ambulatory Visit: Payer: PPO | Admitting: Nurse Practitioner

## 2022-12-03 ENCOUNTER — Other Ambulatory Visit: Payer: PPO

## 2022-12-08 ENCOUNTER — Encounter: Payer: PPO | Admitting: Internal Medicine

## 2022-12-22 ENCOUNTER — Ambulatory Visit: Payer: PPO

## 2023-01-05 ENCOUNTER — Telehealth: Payer: Self-pay

## 2023-01-05 ENCOUNTER — Other Ambulatory Visit: Payer: Self-pay | Admitting: Family Medicine

## 2023-01-05 NOTE — Telephone Encounter (Signed)
Pt needs an appointment for refill and TOC appointment.

## 2023-01-05 NOTE — Telephone Encounter (Signed)
Prescription Request  01/05/2023  LOV: Visit date not found  What is the name of the medication or equipment? amLODipine (NORVASC) 5 MG tablet   Have you contacted your pharmacy to request a refill? No   Which pharmacy would you like this sent to?  [id number] - [place], [place] - [address] [place] [address] Phone: [phone] Fax: [phone]    Patient notified that their request is being sent to the clinical staff for review and that they should receive a response within 2 business days.   Please advise at [facility] [phone]

## 2023-01-05 NOTE — Telephone Encounter (Signed)
Called and spoke with patient, sent refill request and patient stated that she will be moving to [place] soon.

## 2023-01-06 ENCOUNTER — Telehealth: Payer: Self-pay | Admitting: Family Medicine

## 2023-01-06 NOTE — Telephone Encounter (Signed)
Contacted Barbara Marks to schedule their annual wellness visit. Patient declined to schedule AWV at this time.Will call back to schedule AWV with [facility].  [place] Direct Dial: [phone]

## 2023-01-08 MED ORDER — AMLODIPINE BESYLATE 5 MG PO TABS: 5.0000 mg | ORAL_TABLET | Freq: Two times a day (BID) | ORAL | Status: DC

## 2023-01-08 NOTE — Addendum Note (Signed)
Addended by: Patience Musca on: 01/08/2023 10:48 AM   Modules accepted: Orders

## 2023-01-08 NOTE — Telephone Encounter (Signed)
[facility] [address] [facility] refill request for amlodipine 5 mg. Per current med list Dr Renae Gloss prescribed amlodipine 5 mg. Pt does not have future appt and seen other phone note on 01/05/23 where pt is moving. Pt last seen [facility] for HFU on 04/06/22. Pt was due annual exam Feb 2024. Sending note to Worthy Rancher FNP.

## 2023-01-11 NOTE — Telephone Encounter (Signed)
Pt notified amlodipine refill [facility] [address] per Worthy Rancher FNP. Pt said she may be moving and I advised if not she would need to schedule appt TOC and also to continue receiving refills for pts meds; pt voiced understanding.

## 2023-01-14 MED ORDER — AMLODIPINE BESYLATE 5 MG PO TABS: 5.0000 mg | ORAL_TABLET | Freq: Two times a day (BID) | ORAL | 0 refills | Status: DC

## 2023-01-14 NOTE — Telephone Encounter (Addendum)
On med list has amlodipine approved by you on 01/08/23 but there is no quantity listed and has no print rather than normal to send electronically; do you want to approve and send refill of amlodipine or does pt need to schedule appt before refills. I returned call to pt as requested. [facility] not received [facility]ill. Pt is not out of medication. Pt is not positive if moving or not. Sending note to Worthy Rancher FNP. [facility] [address]  Pt request cb after reviewed by FNP.

## 2023-01-14 NOTE — Addendum Note (Signed)
Addended by: Patience Musca on: 01/14/2023 09:46 AM   Modules accepted: Orders

## 2023-01-14 NOTE — Telephone Encounter (Signed)
Patient called in and stated that the pharmacy stated they still have not received this refill. She is wanting someone to give her a call. Thank you!

## 2023-01-18 ENCOUNTER — Telehealth: Payer: Self-pay | Admitting: Family Medicine

## 2023-01-18 NOTE — Telephone Encounter (Signed)
Pt stated she recently had the meds, amLODipine (NORVASC) 5 MG tablet, refilled on 4/11. Pt states the instructions states to take 2 tablets daily. Pt states she normally only takes 1 daily. Pt asked why would instructions changed? Asked pt did she want to scheduled TOC, pt declined due to her moving away soon with her children. Call back # [phone]

## 2023-01-19 ENCOUNTER — Other Ambulatory Visit: Payer: Self-pay | Admitting: Family

## 2023-01-19 MED ORDER — AMLODIPINE BESYLATE 5 MG PO TABS: 5.0000 mg | ORAL_TABLET | Freq: Every day | ORAL | 0 refills | Status: AC

## 2023-02-08 ENCOUNTER — Encounter (HOSPITAL_COMMUNITY): Payer: Self-pay

## 2023-02-12 DIAGNOSIS — R32 Unspecified urinary incontinence: Secondary | ICD-10-CM | POA: Diagnosis not present

## 2023-02-12 DIAGNOSIS — I10 Essential (primary) hypertension: Secondary | ICD-10-CM | POA: Diagnosis not present

## 2023-02-12 DIAGNOSIS — G629 Polyneuropathy, unspecified: Secondary | ICD-10-CM | POA: Diagnosis not present

## 2023-02-12 DIAGNOSIS — D489 Neoplasm of uncertain behavior, unspecified: Secondary | ICD-10-CM | POA: Diagnosis not present

## 2023-02-12 DIAGNOSIS — F32A Depression, unspecified: Secondary | ICD-10-CM | POA: Diagnosis not present

## 2023-02-16 DIAGNOSIS — N3941 Urge incontinence: Secondary | ICD-10-CM | POA: Diagnosis not present

## 2023-02-17 DIAGNOSIS — C44311 Basal cell carcinoma of skin of nose: Secondary | ICD-10-CM | POA: Diagnosis not present

## 2023-02-17 DIAGNOSIS — D485 Neoplasm of uncertain behavior of skin: Secondary | ICD-10-CM | POA: Diagnosis not present

## 2023-02-17 DIAGNOSIS — L578 Other skin changes due to chronic exposure to nonionizing radiation: Secondary | ICD-10-CM | POA: Diagnosis not present

## 2023-02-17 DIAGNOSIS — L570 Actinic keratosis: Secondary | ICD-10-CM | POA: Diagnosis not present

## 2023-03-19 ENCOUNTER — Telehealth: Payer: Self-pay

## 2023-03-19 NOTE — Telephone Encounter (Signed)
Alert received from [employer]:  Point of Service transmission:  Last remote 03/24/2022, clinic 06/02/2022 Normal device function.  Battery estimated 65mo - route to triage 9 VHR EGM's, likely oversensing of ventricular lead noise  Scheduled monthly battery checks.  Will forward to [facility] scheduling to schedule ASAP follow up with [name] or EP APP.

## 2023-03-24 NOTE — Telephone Encounter (Signed)
Called and spoke with the patient, she said that she now resides in [place] and does not need to continue care with [facility].

## 2023-03-25 NOTE — Telephone Encounter (Signed)
[facility] called in [facility] [place] [facility] and then hung up the phone.  Still waiting for son to call back with number to EP doctor in [place]

## 2023-03-25 NOTE — Telephone Encounter (Signed)
Pt called back stating she see Dr. Dell Ponto at the [facility]. I told her if Boneta Lucks has any further questions she will give her a call back.

## 2023-03-25 NOTE — Telephone Encounter (Signed)
Pt called asking for Barbara Marks. I tried to help her but she states to have Barbara Marks give her a call back. Her phone number is [phone].

## 2023-03-25 NOTE — Telephone Encounter (Signed)
Son call to provide doctor contact information - Dr. Janan Halter in [place], ph# [phone].

## 2023-03-25 NOTE — Telephone Encounter (Signed)
Outreach made to daughter.   Daughter gave nurse son Curt's information to call.  [phone]  Outreach made to son.  He states Pt is established with an EP doctor in [place].  Advised that their office has not made an outreach to [place] to assume ownership of remotes.  He is not home right now, he will call device clinic when he gets home to give physician information so contact can be made.  Device clinic phone number given.  Await call back with EP physician information from [place] to contact so that they can assume remote monitoring.

## 2023-03-25 NOTE — Telephone Encounter (Signed)
Outreach made to Pt.  Pt was not able to determine the name of her new heart doctor.  This nurse requested number for son (Pt is now living with son in [place]).  Pt states she will hang up and try to find his number and call the device clinic back.

## 2023-03-26 ENCOUNTER — Telehealth: Payer: Self-pay

## 2023-03-26 NOTE — Telephone Encounter (Signed)
Attempted to contact Dr. Dell Ponto office at [phone] to confirm they are aware of pacemaker battery life, of note the [facility] [facility] on 03/18/23 was from   [facility] [address] [place], [place] [address]

## 2023-03-26 NOTE — Telephone Encounter (Signed)
Spoke with scheduler at Dr. Roxana Hires office she stated patient doesn't have a f/u appointment with him, he only saw her in the hospital, scheduler also stated that now PPM patients are being followed by Dr. Tommi Rumps at [facility], she stated she would send a message to Dr. Roxana Hires to inform him of patients pacemaker battery life.   Also left message  with Dr Amedeo Plenty office to discuss PPM battery life.

## 2023-03-30 ENCOUNTER — Telehealth: Payer: Self-pay

## 2023-03-30 NOTE — Telephone Encounter (Signed)
Dr. Donovan Kail office do not see pacemakers. The gave me the number to Dr. Gwenlyn Fudge office.  Dr. Gwenlyn Fudge office number [phone].  No answer/ no voicemail.

## 2023-03-30 NOTE — Telephone Encounter (Signed)
Opening error 

## 2023-03-31 ENCOUNTER — Other Ambulatory Visit: Payer: Self-pay

## 2023-03-31 ENCOUNTER — Telehealth: Payer: Self-pay

## 2023-03-31 DIAGNOSIS — R001 Bradycardia, unspecified: Secondary | ICD-10-CM

## 2023-03-31 NOTE — Telephone Encounter (Signed)
Urgent referral sent to Dr. Tommi Rumps at [facility] for pacemaker follow-up.

## 2023-04-02 NOTE — Telephone Encounter (Signed)
I spoke with a messenger for Dr. Gwenlyn Fudge office. He was going to send a message for the device clinic to request a transfer to their clinic. I let them know that the patient has 2 months left on her battery.
